# Patient Record
Sex: Female | Born: 1950
Health system: Southern US, Community
[De-identification: ages and names within clinical notes are randomized; demographics above are authoritative.]

## PROBLEM LIST (undated history)

## (undated) DIAGNOSIS — Z9889 Other specified postprocedural states: Secondary | ICD-10-CM

## (undated) DIAGNOSIS — G629 Polyneuropathy, unspecified: Secondary | ICD-10-CM

## (undated) DIAGNOSIS — M1990 Unspecified osteoarthritis, unspecified site: Secondary | ICD-10-CM

## (undated) DIAGNOSIS — D649 Anemia, unspecified: Secondary | ICD-10-CM

## (undated) DIAGNOSIS — E785 Hyperlipidemia, unspecified: Secondary | ICD-10-CM

## (undated) DIAGNOSIS — I10 Essential (primary) hypertension: Secondary | ICD-10-CM

## (undated) DIAGNOSIS — R011 Cardiac murmur, unspecified: Secondary | ICD-10-CM

## (undated) DIAGNOSIS — D869 Sarcoidosis, unspecified: Secondary | ICD-10-CM

## (undated) DIAGNOSIS — R112 Nausea with vomiting, unspecified: Secondary | ICD-10-CM

## (undated) DIAGNOSIS — K219 Gastro-esophageal reflux disease without esophagitis: Secondary | ICD-10-CM

## (undated) HISTORY — PX: TUBAL LIGATION: SHX77

## (undated) HISTORY — PX: MASTECTOMY, PARTIAL: SHX709

## (undated) HISTORY — DX: Hyperlipidemia, unspecified: E78.5

## (undated) HISTORY — PX: CHOLECYSTECTOMY: SHX55

---

## 2000-07-07 ENCOUNTER — Ambulatory Visit (HOSPITAL_COMMUNITY): Admission: RE | Admit: 2000-07-07 | Discharge: 2000-07-07 | Payer: Self-pay | Admitting: General Surgery

## 2001-02-14 ENCOUNTER — Emergency Department (HOSPITAL_COMMUNITY): Admission: EM | Admit: 2001-02-14 | Discharge: 2001-02-14 | Payer: Self-pay | Admitting: Emergency Medicine

## 2001-06-22 ENCOUNTER — Encounter: Payer: Self-pay | Admitting: General Surgery

## 2001-06-22 ENCOUNTER — Ambulatory Visit (HOSPITAL_COMMUNITY): Admission: RE | Admit: 2001-06-22 | Discharge: 2001-06-22 | Payer: Self-pay | Admitting: General Surgery

## 2001-06-28 ENCOUNTER — Emergency Department (HOSPITAL_COMMUNITY): Admission: EM | Admit: 2001-06-28 | Discharge: 2001-06-28 | Payer: Self-pay | Admitting: Emergency Medicine

## 2001-08-23 ENCOUNTER — Ambulatory Visit (HOSPITAL_COMMUNITY): Admission: RE | Admit: 2001-08-23 | Discharge: 2001-08-23 | Payer: Self-pay | Admitting: Internal Medicine

## 2001-08-23 ENCOUNTER — Encounter: Payer: Self-pay | Admitting: Internal Medicine

## 2001-11-06 ENCOUNTER — Ambulatory Visit (HOSPITAL_COMMUNITY): Admission: RE | Admit: 2001-11-06 | Discharge: 2001-11-06 | Payer: Self-pay | Admitting: Internal Medicine

## 2001-11-06 ENCOUNTER — Encounter: Payer: Self-pay | Admitting: Internal Medicine

## 2001-11-09 ENCOUNTER — Encounter: Payer: Self-pay | Admitting: Internal Medicine

## 2001-11-09 ENCOUNTER — Inpatient Hospital Stay (HOSPITAL_COMMUNITY): Admission: AD | Admit: 2001-11-09 | Discharge: 2001-11-11 | Payer: Self-pay | Admitting: Internal Medicine

## 2001-11-24 ENCOUNTER — Emergency Department (HOSPITAL_COMMUNITY): Admission: EM | Admit: 2001-11-24 | Discharge: 2001-11-24 | Payer: Self-pay | Admitting: Emergency Medicine

## 2001-12-01 ENCOUNTER — Inpatient Hospital Stay (HOSPITAL_COMMUNITY): Admission: EM | Admit: 2001-12-01 | Discharge: 2001-12-07 | Payer: Self-pay | Admitting: Psychiatry

## 2002-01-29 ENCOUNTER — Ambulatory Visit (HOSPITAL_COMMUNITY): Admission: RE | Admit: 2002-01-29 | Discharge: 2002-01-29 | Payer: Self-pay | Admitting: Internal Medicine

## 2002-01-29 ENCOUNTER — Encounter: Payer: Self-pay | Admitting: Internal Medicine

## 2002-02-20 ENCOUNTER — Emergency Department (HOSPITAL_COMMUNITY): Admission: EM | Admit: 2002-02-20 | Discharge: 2002-02-20 | Payer: Self-pay | Admitting: *Deleted

## 2002-02-20 ENCOUNTER — Encounter: Payer: Self-pay | Admitting: *Deleted

## 2002-03-26 ENCOUNTER — Ambulatory Visit (HOSPITAL_COMMUNITY): Admission: RE | Admit: 2002-03-26 | Discharge: 2002-03-26 | Payer: Self-pay | Admitting: Internal Medicine

## 2002-03-26 ENCOUNTER — Encounter: Payer: Self-pay | Admitting: Internal Medicine

## 2002-07-10 ENCOUNTER — Encounter: Payer: Self-pay | Admitting: General Surgery

## 2002-07-10 ENCOUNTER — Ambulatory Visit (HOSPITAL_COMMUNITY): Admission: RE | Admit: 2002-07-10 | Discharge: 2002-07-10 | Payer: Self-pay | Admitting: General Surgery

## 2003-02-28 ENCOUNTER — Ambulatory Visit (HOSPITAL_COMMUNITY): Admission: RE | Admit: 2003-02-28 | Discharge: 2003-02-28 | Payer: Self-pay | Admitting: Internal Medicine

## 2003-07-14 ENCOUNTER — Ambulatory Visit (HOSPITAL_COMMUNITY): Admission: RE | Admit: 2003-07-14 | Discharge: 2003-07-14 | Payer: Self-pay | Admitting: Internal Medicine

## 2003-08-01 ENCOUNTER — Ambulatory Visit (HOSPITAL_COMMUNITY): Admission: RE | Admit: 2003-08-01 | Discharge: 2003-08-01 | Payer: Self-pay | Admitting: Internal Medicine

## 2003-08-06 ENCOUNTER — Ambulatory Visit (HOSPITAL_COMMUNITY): Admission: RE | Admit: 2003-08-06 | Discharge: 2003-08-06 | Payer: Self-pay | Admitting: Internal Medicine

## 2003-08-18 ENCOUNTER — Ambulatory Visit (HOSPITAL_COMMUNITY): Admission: RE | Admit: 2003-08-18 | Discharge: 2003-08-18 | Payer: Self-pay | Admitting: Orthopedic Surgery

## 2004-01-12 ENCOUNTER — Ambulatory Visit (HOSPITAL_COMMUNITY): Admission: RE | Admit: 2004-01-12 | Discharge: 2004-01-12 | Payer: Self-pay | Admitting: Internal Medicine

## 2004-08-09 ENCOUNTER — Ambulatory Visit (HOSPITAL_COMMUNITY): Admission: RE | Admit: 2004-08-09 | Discharge: 2004-08-09 | Payer: Self-pay | Admitting: Internal Medicine

## 2004-08-20 ENCOUNTER — Encounter: Admission: RE | Admit: 2004-08-20 | Discharge: 2004-08-20 | Payer: Self-pay | Admitting: Internal Medicine

## 2004-08-29 ENCOUNTER — Emergency Department (HOSPITAL_COMMUNITY): Admission: EM | Admit: 2004-08-29 | Discharge: 2004-08-29 | Payer: Self-pay | Admitting: Emergency Medicine

## 2004-09-01 ENCOUNTER — Ambulatory Visit (HOSPITAL_COMMUNITY): Admission: RE | Admit: 2004-09-01 | Discharge: 2004-09-01 | Payer: Self-pay | Admitting: Emergency Medicine

## 2004-09-08 ENCOUNTER — Ambulatory Visit: Payer: Self-pay | Admitting: Orthopedic Surgery

## 2005-07-28 ENCOUNTER — Emergency Department (HOSPITAL_COMMUNITY): Admission: EM | Admit: 2005-07-28 | Discharge: 2005-07-28 | Payer: Self-pay | Admitting: Emergency Medicine

## 2005-12-19 ENCOUNTER — Ambulatory Visit (HOSPITAL_COMMUNITY): Admission: RE | Admit: 2005-12-19 | Discharge: 2005-12-19 | Payer: Self-pay | Admitting: Internal Medicine

## 2007-05-07 ENCOUNTER — Emergency Department (HOSPITAL_COMMUNITY): Admission: EM | Admit: 2007-05-07 | Discharge: 2007-05-07 | Payer: Self-pay | Admitting: Emergency Medicine

## 2007-05-08 ENCOUNTER — Encounter: Admission: RE | Admit: 2007-05-08 | Discharge: 2007-05-08 | Payer: Self-pay | Admitting: Internal Medicine

## 2007-08-24 ENCOUNTER — Ambulatory Visit (HOSPITAL_COMMUNITY): Admission: RE | Admit: 2007-08-24 | Discharge: 2007-08-24 | Payer: Self-pay | Admitting: Cardiology

## 2008-07-04 ENCOUNTER — Encounter: Admission: RE | Admit: 2008-07-04 | Discharge: 2008-07-04 | Payer: Self-pay | Admitting: Internal Medicine

## 2008-12-28 ENCOUNTER — Emergency Department (HOSPITAL_COMMUNITY): Admission: EM | Admit: 2008-12-28 | Discharge: 2008-12-28 | Payer: Self-pay | Admitting: Emergency Medicine

## 2009-04-08 ENCOUNTER — Emergency Department (HOSPITAL_COMMUNITY): Admission: EM | Admit: 2009-04-08 | Discharge: 2009-04-08 | Payer: Self-pay | Admitting: Emergency Medicine

## 2010-04-18 ENCOUNTER — Encounter: Payer: Self-pay | Admitting: Internal Medicine

## 2010-05-21 ENCOUNTER — Encounter: Payer: Self-pay | Admitting: Internal Medicine

## 2010-05-25 NOTE — Letter (Addendum)
Summary: [facility] TRIAGE  [facility] TRIAGE   Imported By: Rexene Alberts 05/21/2010 09:09:42  _____________________________________________________________________  External Attachment:    Type:   Image     Comment:   External Document  Appended Document: [facility] TRIAGE decrease lantus to 30 units, hold glipizide and metformin the night before the procedure; o/w OK  Appended Document: [facility] TRIAGE instructions mailed to patient and explained on the phone

## 2010-06-02 ENCOUNTER — Ambulatory Visit (HOSPITAL_COMMUNITY)
Admission: RE | Admit: 2010-06-02 | Discharge: 2010-06-02 | Disposition: A | Discharge status: Home / Self Care | Payer: No Typology Code available for payment source | Source: Ambulatory Visit | Attending: Internal Medicine | Admitting: Internal Medicine

## 2010-06-02 ENCOUNTER — Encounter: Payer: No Typology Code available for payment source | Admitting: Internal Medicine

## 2010-06-02 ENCOUNTER — Encounter: Payer: Self-pay | Admitting: Internal Medicine

## 2010-06-02 ENCOUNTER — Other Ambulatory Visit: Payer: Self-pay | Admitting: Internal Medicine

## 2010-06-02 DIAGNOSIS — D869 Sarcoidosis, unspecified: Secondary | ICD-10-CM | POA: Insufficient documentation

## 2010-06-02 DIAGNOSIS — Z794 Long term (current) use of insulin: Secondary | ICD-10-CM | POA: Insufficient documentation

## 2010-06-02 DIAGNOSIS — D126 Benign neoplasm of colon, unspecified: Secondary | ICD-10-CM | POA: Insufficient documentation

## 2010-06-02 DIAGNOSIS — K5730 Diverticulosis of large intestine without perforation or abscess without bleeding: Secondary | ICD-10-CM | POA: Insufficient documentation

## 2010-06-02 DIAGNOSIS — K648 Other hemorrhoids: Secondary | ICD-10-CM

## 2010-06-02 DIAGNOSIS — I10 Essential (primary) hypertension: Secondary | ICD-10-CM | POA: Insufficient documentation

## 2010-06-02 DIAGNOSIS — E119 Type 2 diabetes mellitus without complications: Secondary | ICD-10-CM | POA: Insufficient documentation

## 2010-06-02 DIAGNOSIS — Z1211 Encounter for screening for malignant neoplasm of colon: Secondary | ICD-10-CM

## 2010-06-02 DIAGNOSIS — Z79899 Other long term (current) drug therapy: Secondary | ICD-10-CM | POA: Insufficient documentation

## 2010-06-02 LAB — GLUCOSE, CAPILLARY: Glucose-Capillary: 148 mg/dL — ABNORMAL HIGH (ref 70–99)

## 2010-06-13 ENCOUNTER — Encounter: Payer: Self-pay | Admitting: Internal Medicine

## 2010-06-17 ENCOUNTER — Other Ambulatory Visit: Payer: Self-pay | Admitting: Internal Medicine

## 2010-06-17 DIAGNOSIS — Z1231 Encounter for screening mammogram for malignant neoplasm of breast: Secondary | ICD-10-CM

## 2010-06-22 NOTE — Op Note (Signed)
  NAME:  Ann Roberson, Ann Roberson            ACCOUNT NO.:  [id number]  MEDICAL RECORD NO.:  [id number]           PATIENT TYPE:  O  LOCATION:  DAYP                          FACILITY:  [facility]  PHYSICIAN:  R. Roetta Sessions, M.D. DATE OF BIRTH:  1950/04/23  DATE OF PROCEDURE:  06/02/2010 DATE OF DISCHARGE:  06/02/2010                              OPERATIVE REPORT   PROCEDURE:  Colonoscopy with snare polypectomy and polyp ablation.  INDICATIONS FOR PROCEDURE:  A 60 year old lady here for first ever screening colonoscopy.  She has no GI symptoms.  No family history of polyps or colon cancer in any first-degree relatives.  Colonoscopy is now being done as screening maneuver.  Risks, benefits, limitations, alternatives and imponderables have been discussed, questions answered. Please see the documentation medical record.  PROCEDURE NOTE:  O2 saturation, blood pressure, pulse, and respirations were monitored throughout the entire procedure.  Conscious sedation Versed 5 mg IV, Demerol 75 mg IV in divided doses.  INSTRUMENT:  Pentax video chip system.  FINDINGS:  Digital rectal exam revealed no abnormalities.  Endoscopic findings:  The prep was adequate.  Colon:  Colonic mucosa was surveyed from the rectosigmoid junction to the left transverse, right colon to the appendiceal orifice, ileocecal valve/cecum.  These structures well seen photographed for the record.  From this level, the scope slowly and cautiously withdrawn. All previous mucosal surfaces were again seen. The patient with multiple colonic polyps.  Multiple snare polypectomies were performed in the cecal and the ascending segments diminutive polyps were ablated in the transverse and descending segments as well.  The patient was also noted have pan colonic diverticulosis.  No other abnormalities were observed.  The scope was pulled down into the rectum where thorough examination of the rectal mucosa including retroflex view of anal verge  and en face view of the anal canal demonstrated only internal hemorrhoids.  The patient tolerated the procedure well.  Cecal withdrawal time 22 minutes.  IMPRESSION: 1. Internal hemorrhoids, otherwise normal rectum. 2. Pancolonic diverticula. 3. Multiple colonic polyps either snared or ablated as described     above.  RECOMMENDATIONS: 1. Literature on diverticulosis and polyps provided to Ms. Swander. 2. Follow up on path. 3. Further recommendations to follow.     Jonathon Bellows, M.D.     RMR/MEDQ  D:  06/13/2010  T:  06/14/2010  Job:  [id number]  Electronically Signed by Lorrin Goodell M.D. on 06/22/2010 11:22:48 AM

## 2010-06-24 NOTE — Letter (Signed)
Summary: Patient Notice, Colon Biopsy Results  [facility]  [address]   [place], [place] [address]   Phone: [phone]  Fax: [phone]       June 13, 2010   Ann Roberson [address] [place], [place]  [address] [birth year]    Dear [name],  I am pleased to inform you that the biopsies taken during your recent colonoscopy did not show any evidence of cancer upon pathologic examination.  Additional information/recommendations:  No further action is needed at this time.  Please follow-up with your primary care physician for your other healthcare needs.  You should have a repeat colonoscopy examination  in 5 years.  Please call us if you are having persistent problems or have questions about your condition that have not been fully answered at this time.  Sincerely,    R. Roetta Sessions MD, FACP [facility] Ph: [phone]    Fax: [phone]   Appended Document: Patient Notice, Colon Biopsy Results letter mailed to pt  Appended Document: Patient Notice, Colon Biopsy Results reminder in epic

## 2010-07-01 LAB — URIC ACID: Uric Acid, Serum: 5.5 mg/dL (ref 2.4–7.0)

## 2010-07-16 ENCOUNTER — Ambulatory Visit
Admission: RE | Admit: 2010-07-16 | Discharge: 2010-07-16 | Disposition: A | Discharge status: Home / Self Care | Payer: No Typology Code available for payment source | Source: Ambulatory Visit | Attending: Internal Medicine | Admitting: Internal Medicine

## 2010-07-16 DIAGNOSIS — Z1231 Encounter for screening mammogram for malignant neoplasm of breast: Secondary | ICD-10-CM

## 2010-08-13 NOTE — H&P (Signed)
NAME:  Ann Roberson, Ann Roberson                      ACCOUNT NO.:  [id number]   MEDICAL RECORD NO.:  [id number]                   PATIENT TYPE:  INP   LOCATION:  A325                                 FACILITY:  [facility]   PHYSICIAN:  Tesfaye D. Felecia Shelling, M.D.              DATE OF BIRTH:  1950/12/17   DATE OF ADMISSION:  11/09/2001  DATE OF DISCHARGE:                                HISTORY & PHYSICAL   CHIEF COMPLAINT:  Shortness of breath and generalized weakness.   HISTORY OF PRESENT ILLNESS:  This is a 60 year old black female with a  history of recent diagnosis of sarcoidosis, who came to the office with the  above complaints.  The patient had a skin lesion on her scalp which was  biopsied and showed granulomatous lesion consistent with sarcoidosis.  She  had also complained of cough and progressive shortness of breath.  Her chest  x-ray was done which was showing hilar lymphadenopathy with some scarring  which was compatible with sarcoidosis.  She was started on oral steroid.  Initially, the patient started feeling better.  When the steroid started  being tapered, the patient developed symptoms of shortness of breath and  cough.  Her steroid was again increased to 60 mg p.o. daily, however, the  patient came back with the complaint of generalized weakness, polyuria and  nocturia.  Blood test was done and her blood sugar was in the range of 500  mg/dl.  The patient was given a dose of Regular insulin and she was given a  prescription for long-acting insulin, however, the patient's blood sugar  remained around 500 mg/dl and the patient continued to complain of shortness  of breath and generalized weakness.  She came to the point where she was not  able to give insulin for herself and her appetite was very poor.  The  patient was unable to manage herself at home.  She was then reevaluated and  was admitted for further evaluation of her shortness of breath and  management of her  hyperglycemia.   PAST MEDICAL HISTORY:  1. Sarcoidosis.  2. Obesity.   CURRENT MEDICATIONS:  1. Prednisone 60 mg p.o. q.d.  2. Insulin 70/30 -- 30 units subcut. in a.m.   PERSONAL AND SOCIAL HISTORY:  The patient is divorced.  She has one child.  She denies history of alcohol, tobacco or substance abuse.   PHYSICAL EXAMINATION:  GENERAL:  The patient is alert, awake and is acutely  sick-looking.  VITALS:  Blood pressure 130/80, pulse 88, respiratory rate 16, temperature  98 degrees Fahrenheit.  HEENT:  Pupils are equal and reactive.  NECK:  Supple.  CHEST:  There is decreased air entry and bilateral rhonchi.  CARDIOVASCULAR:  First and second heart sounds heard.  No murmur.  No  gallop.  ABDOMEN:  Abdomen is soft and relaxed.  Bowel sounds are positive.  No mass.  No organomegaly.  Obese.  EXTREMITIES:  No leg edema.    ASSESSMENT:  1. This is a 60 year old black female who was recently diagnosed with     cutaneous and pulmonary sarcoidosis.  She was started on steroid,     however, the patient has progressive shortness of breath and generalized     weakness.  Etiology of her symptoms is not fully clear but could be     secondary to the sarcoidosis.  2. Steroid-induced diabetes mellitus.  3. Morbid obesity.   PLAN:  Will start the patient on IV fluids and rehydrate gradually.  Will  start her on Accu-Chek a.c. and q.h.s. with coverage.  Will do diabetic  teaching and instruct the patient how to give insulin.  Will continue the  patient on steroid and will start her on oxygen.                                               Tesfaye D. Felecia Shelling, M.D.    TDF/MEDQ  D:  11/09/2001  T:  11/09/2001  Job:  [id number]

## 2010-08-13 NOTE — H&P (Signed)
NAME:  Ann Roberson, Ann Roberson NO.:  [id number]   MEDICAL RECORD NO.:  [id number]                   PATIENT TYPE:  IPS   LOCATION:  0406                                 FACILITY:  [facility]   PHYSICIAN:  Jeanice Lim, MD                DATE OF BIRTH:  [birth year]   DATE OF ADMISSION:  12/01/2001  DATE OF DISCHARGE:                         PSYCHIATRIC ADMISSION ASSESSMENT   IDENTIFYING INFORMATION:  A 60 year old single African-American female,  voluntarily admitted for psychosis on December 01, 2001.   HISTORY OF PRESENT ILLNESS:  The patient presents with a history of  confusion, decompensating for at least a week.  The patient feels very  anxious and afraid, does not know why.  Has been on prednisone since May, on  tapering doses for history of sarcoidosis.  Has an appointment this upcoming  Thursday with her lung specialist.  Feels like she is in slow motion, has  a film over her eyes.  She denies any psychotic symptoms, hallucinations,  but admits to some depression, with reports of decreased sleep and decreased  appetite.  The patient feels afraid of things, is not sure what.  Was  recently diagnosed as a diabetic, states she is afraid of sticking herself  and afraid of sleeping at present.   PAST PSYCHIATRIC HISTORY:  Last hospitalization was 15 years ago for a  nervous breakdown.  No history of a suicide attempt.   SOCIAL HISTORY:  The patient is a 60 year old single African-American  female.  She has a 43 year old child.  She lives with her child.  She is a  Lawyer in [employer], broadcasting/film/video.   FAMILY HISTORY:  Unknown.   ALCOHOL DRUG HISTORY:  She denies any alcohol or substance abuse.   PAST MEDICAL HISTORY:  Primary care provider is Dr. Felecia Shelling, her general  practitioner, [phone] is phone number.  Medical problems are insulin-  dependent diabetes, lung infection.   MEDICATIONS:  Prednisone 10 mg q.d., NPH insulin 30 mg in the morning, 20 mg  q.p.m.   DRUG ALLERGIES:  No known allergies.   PHYSICAL EXAMINATION:  Performed at [facility].  CBC was within normal  limits.  Glucose was 133, total bilirubin was 1.7, albumin was 3.2.  Urine  drug screen was positive for benzos.  Urine pregnancy test was negative.  Alcohol level less than 5.  Urinalysis showed small bili, some protein, few  bacteria, amber and hazy appearing.   MENTAL STATUS EXAM:  She is an alert, obese African-American female.  She is  cooperative.  She is dressed in a gown.  Speech is clear, mood is depressed  and anxious, affect is depressed and anxious, keeps squinting trying to see  me.  Thought processes are positive for thought blocking, positive paranoia,  no auditory or visual hallucinations, no suicidal or homicidal ideation.  Cognitive function intact.  Judgment and insight are fair.   ADMISSION DIAGNOSES:   AXIS  I:  1. Psychosis not otherwise specified.  2. Rule out steroid-induced psychosis.   AXIS II:  Deferred.   AXIS III:  Insulin-dependent diabetes, sarcoidosis.   AXIS IV:  Medical problems.   AXIS V:  Current is 25, this past year 23.   PLAN:  Voluntary admission to [facility] for psychosis.  Contract for safety, check every 15 minutes.  The patient will be placed on  the 400 hall.  Will obtain labs, will continue her medications, will check  her blood sugars.  The patient to be put on a diabetic diet.  Will contact  her primary care provider in regards to her prednisone taper.  Our goal is  to stabilize mood and thinking so the patient can be safe.  Consider an  internal medicine consult if the patient does not clear.       Landry Corporal, N.P.                       Jeanice Lim, MD    JO/MEDQ  D:  12/04/2001  T:  12/04/2001  Job:  [id number]

## 2010-08-13 NOTE — Discharge Summary (Signed)
   NAME:  Ann Roberson, Ann Roberson                      ACCOUNT NO.:  [id number]   MEDICAL RECORD NO.:  [id number]                   PATIENT TYPE:  INP   LOCATION:  A325                                 FACILITY:  [facility]   PHYSICIAN:  Tesfaye D. Felecia Shelling, M.D.              DATE OF BIRTH:  21-Nov-1950   DATE OF ADMISSION:  11/09/2001  DATE OF DISCHARGE:  11/11/2001                                 DISCHARGE SUMMARY   DISCHARGE DIAGNOSES:  1. Steroid-induced diabetes mellitus.  2. Obesity.  3. Sarcoidosis.   DISCHARGE MEDICATIONS:  1. Humulin insulin 70/30 30 units subcu in a.m. and 20 units subcu in p.m.  2. Prednisone 60 mg p.o. q.d.   DISPOSITION:  The patient was discharged home in stable condition.   HOSPITAL COURSE:  This is a 60 year old female patient who was recently  diagnosed with sarcoidosis.  The patient was started on prednisone 60 mg  p.o. q.d. for treatment of her sarcoidosis.  However, the patient developed  hyperglycemia.  She was tried to be treated with insulin as an outpatient.  However, her blood sugar continued to be above 500.  She was admitted and  was treated with sliding scale coverage.  Her insulin requirement was  adjusted.  The patient was educated about diabetes and how to self-inject  insulin.  She was discharged home in stable condition.                                               Tesfaye D. Felecia Shelling, M.D.    TDF/MEDQ  D:  12/27/2001  T:  12/31/2001  Job:  [id number]

## 2010-08-13 NOTE — Discharge Summary (Signed)
NAME:  Ann Roberson, Ann Roberson                      ACCOUNT NO.:  [id number]   MEDICAL RECORD NO.:  [id number]                   PATIENT TYPE:  IPS   LOCATION:  0406                                 FACILITY:  [facility]   PHYSICIAN:  Geoffery Lyons, M.D.                   DATE OF BIRTH:  01-01-1951   DATE OF ADMISSION:  12/01/2001  DATE OF DISCHARGE:  12/07/2001                                 DISCHARGE SUMMARY   CHIEF COMPLAINT AND PRESENT ILLNESS:  This was one of several admissions to  [facility] for this 60 year old single African-American  female voluntarily admitted on December 01, 2001.  History of confusion,  decompensating for at least a week, very anxious and afraid, does not know  why.  Has been on prednisone since May.  Endorses for a history of ________.  Appointment upcoming Thursday with her lung specialist.  Like she is in slow  motion, film over her eyes.  No psychotic symptoms, hallucinations.  Admits  to some depression, decreased sleep, decreased appetite, afraid of things.  Initially diagnosed as diabetic.  Afraid of sticking herself and a fear of  sleeping.   PAST PSYCHIATRIC HISTORY:  Last time hospitalized 15 years ago for nervous  breakdown.  No history of suicide attempts.   ALCOHOL/DRUG HISTORY:  Denies the use or abuse of any substances.   PAST MEDICAL HISTORY:  Insulin-dependent diabetes mellitus.   MEDICATIONS:  Prednisone 10 mg daily, NPH insulin 30 in the morning and 20  in the afternoon.   PHYSICAL EXAMINATION:  Performed at [facility] and failed to show any acute  findings.   MENTAL STATUS EXAM:  Alert, obese, African-American female.  Cooperative.  Dressed in a gown.  Speech is clear.  Mood is depressed and anxious.  Affect  is depressed and anxious.  Keeps squinting, trying to see the person doing  the evaluation.  Thought processes are positive for thought-blocking,  positive paranoia and auditory or visual hallucinations.  No suicidal  or  homicidal ideation.  Cognition well-preserved.   ADMISSION DIAGNOSES:   AXIS I:  1. Rule out psychotic disorder not otherwise specified.  2. Rule out steroid-induced psychosis.   AXIS II:  Deferred.   AXIS III:  1. Insulin-dependent diabetes mellitus.  2. ______.   AXIS IV:  Moderate.   AXIS V:  Global Assessment of Functioning upon admission 25; highest Global  Assessment of Functioning in the last year 70.   LABORATORY DATA:  CBC was within normal limits.  Blood chemistries were  within normal limits.  Glucose was 133 upon admission, 85 on the 9th.  Total  bilirubin was 1.7.  Other liver tests were within normal limits.  RPR was  nonreactive.   HOSPITAL COURSE:  She was admitted and started intensive individual and  group psychotherapy.  She was kept on her prednisone and her Xanax and her  insulin.  As  we were trying to establish what was going on, medical services  were consulted.  Meanwhile, she was placed on Risperdal 1.25 mg three times  a day and 0.5 mg at night and she was given the Xanax 0.5 mg as needed every  eight hours as needed for anxiety.  Prednisone was decreased to 5 mg per  day.  Insulin was adjusted.  Risperdal was decreased to 0.25 mg twice a day  and 0.5 mg at night and the insulin was adjusted one more time to 35 units  in the morning and 20 units in the afternoon.  As the hospitalization  progressed, she started improving.  Initially, issues of being confused, not  being able to put into words what was going on, her frustration.  Because of  this, she started sleeping better.  Became less anxious, less agitated and  objectively seemed to be settling down.  No further complaints about her  eyesight.  On December 07, 2001, she was much improved.  No agitation.  No  irritability.  Was sleeping better.  Her eyesight was better.  She endorsed  no suicidal ideation.  No homicidal ideation.  It was felt that she was back  to her baseline so discharge  was considered and granted.   DISCHARGE DIAGNOSES:   AXIS I:  1. Psychotic disorder not otherwise specified.  2. Rule out steroid-induced psychosis.   AXIS II:  No diagnosis.   AXIS III:  1. __________.  2. Insulin-dependent diabetes mellitus.   AXIS IV:  Moderate.   AXIS V:  Global Assessment of Functioning upon discharge 55-60.   DISCHARGE MEDICATIONS:  1. Xanax 0.5 mg three times a day.  2. Novolin 70/30 insulin 30 units in the morning and 20 units in the     afternoon.  3. Risperdal 0.25 mg twice a day and 0.5 mg at night.  4. Prednisone 1/2 a tab daily.   FOLLOW UP:  [facility].                                               Geoffery Lyons, M.D.    IL/MEDQ  D:  01/09/2002  T:  01/09/2002  Job:  [id number]

## 2010-12-17 LAB — POCT CARDIAC MARKERS
CKMB, poc: 2.3
Myoglobin, poc: 155
Operator id: 221061
Troponin i, poc: <0.05

## 2010-12-17 LAB — BASIC METABOLIC PANEL WITH GFR
BUN: 17
CO2: 27
Calcium: 9.1
Chloride: 103
Creatinine, Ser: 0.97
GFR calc non Af Amer: 60 — ABNORMAL LOW
Glucose, Bld: 140 — ABNORMAL HIGH
Potassium: 3.6
Sodium: 135

## 2010-12-17 LAB — DIFFERENTIAL
Basophils Absolute: 0
Basophils Relative: 1
Eosinophils Absolute: 0.2
Eosinophils Relative: 4
Lymphocytes Relative: 26
Lymphs Abs: 1
Monocytes Absolute: 0.6
Monocytes Relative: 15 — ABNORMAL HIGH
Neutro Abs: 2.2
Neutrophils Relative %: 55

## 2010-12-17 LAB — CBC
HCT: 39.7
Hemoglobin: 13.6
MCHC: 34.1
MCV: 83.5
Platelets: 295
RBC: 4.75
RDW: 13.4
WBC: 4.1

## 2010-12-22 LAB — BASIC METABOLIC PANEL
BUN: 19
CO2: 27
Calcium: 9
Chloride: 105
Creatinine, Ser: 1.06
GFR calc Af Amer: >60
GFR calc non Af Amer: 54 — ABNORMAL LOW
Glucose, Bld: 156 — ABNORMAL HIGH
Potassium: 3.8
Sodium: 138

## 2010-12-22 LAB — CBC
HCT: 35.6 — ABNORMAL LOW
Hemoglobin: 11.6 — ABNORMAL LOW
MCHC: 32.6
MCV: 86.2
Platelets: 300
RBC: 4.13
RDW: 12.9
WBC: 6.8

## 2010-12-22 LAB — PROTIME-INR
INR: 0.9
Prothrombin Time: 12.8

## 2011-01-29 ENCOUNTER — Emergency Department (HOSPITAL_COMMUNITY): Payer: No Typology Code available for payment source

## 2011-01-29 ENCOUNTER — Emergency Department (HOSPITAL_COMMUNITY)
Admission: EM | Admit: 2011-01-29 | Discharge: 2011-01-29 | Disposition: A | Discharge status: Home / Self Care | Payer: No Typology Code available for payment source | Attending: Emergency Medicine | Admitting: Emergency Medicine

## 2011-01-29 DIAGNOSIS — Z9889 Other specified postprocedural states: Secondary | ICD-10-CM | POA: Insufficient documentation

## 2011-01-29 DIAGNOSIS — R109 Unspecified abdominal pain: Secondary | ICD-10-CM | POA: Insufficient documentation

## 2011-01-29 DIAGNOSIS — D869 Sarcoidosis, unspecified: Secondary | ICD-10-CM | POA: Insufficient documentation

## 2011-01-29 DIAGNOSIS — N390 Urinary tract infection, site not specified: Secondary | ICD-10-CM | POA: Insufficient documentation

## 2011-01-29 DIAGNOSIS — E119 Type 2 diabetes mellitus without complications: Secondary | ICD-10-CM | POA: Insufficient documentation

## 2011-01-29 HISTORY — DX: Sarcoidosis, unspecified: D86.9

## 2011-01-29 LAB — DIFFERENTIAL
Basophils Absolute: 0 10*3/uL (ref 0.0–0.1)
Basophils Relative: 1 % (ref 0–1)
Eosinophils Absolute: 0.2 10*3/uL (ref 0.0–0.7)
Eosinophils Relative: 3 % (ref 0–5)
Lymphocytes Relative: 21 % (ref 12–46)
Lymphs Abs: 1.6 10*3/uL (ref 0.7–4.0)
Monocytes Absolute: 0.6 10*3/uL (ref 0.1–1.0)
Monocytes Relative: 8 % (ref 3–12)
Neutro Abs: 5.1 10*3/uL (ref 1.7–7.7)
Neutrophils Relative %: 68 % (ref 43–77)

## 2011-01-29 LAB — URINALYSIS, ROUTINE W REFLEX MICROSCOPIC
Bilirubin Urine: NEGATIVE
Glucose, UA: NEGATIVE mg/dL
Hgb urine dipstick: NEGATIVE
Nitrite: NEGATIVE
Protein, ur: NEGATIVE mg/dL
Specific Gravity, Urine: >1.03 — ABNORMAL HIGH (ref 1.005–1.030)
Urobilinogen, UA: 0.2 mg/dL (ref 0.0–1.0)
pH: 6 (ref 5.0–8.0)

## 2011-01-29 LAB — COMPREHENSIVE METABOLIC PANEL
ALT: 16 U/L (ref 0–35)
AST: 13 U/L (ref 0–37)
Albumin: 3.4 g/dL — ABNORMAL LOW (ref 3.5–5.2)
Alkaline Phosphatase: 79 U/L (ref 39–117)
BUN: 13 mg/dL (ref 6–23)
CO2: 29 mEq/L (ref 19–32)
Calcium: 10 mg/dL (ref 8.4–10.5)
Chloride: 101 mEq/L (ref 96–112)
Creatinine, Ser: 0.86 mg/dL (ref 0.50–1.10)
GFR calc Af Amer: 85 mL/min — ABNORMAL LOW (ref >90)
GFR calc non Af Amer: 73 mL/min — ABNORMAL LOW (ref >90)
Glucose, Bld: 88 mg/dL (ref 70–99)
Potassium: 3.7 mEq/L (ref 3.5–5.1)
Sodium: 139 mEq/L (ref 135–145)
Total Bilirubin: 0.3 mg/dL (ref 0.3–1.2)
Total Protein: 7.7 g/dL (ref 6.0–8.3)

## 2011-01-29 LAB — CBC
HCT: 37.5 % (ref 36.0–46.0)
Hemoglobin: 11.7 g/dL — ABNORMAL LOW (ref 12.0–15.0)
MCH: 27.1 pg (ref 26.0–34.0)
MCHC: 31.2 g/dL (ref 30.0–36.0)
MCV: 86.8 fL (ref 78.0–100.0)
Platelets: 291 10*3/uL (ref 150–400)
RBC: 4.32 MIL/uL (ref 3.87–5.11)
RDW: 12.8 % (ref 11.5–15.5)
WBC: 7.6 10*3/uL (ref 4.0–10.5)

## 2011-01-29 LAB — GLUCOSE, CAPILLARY: Glucose-Capillary: 97 mg/dL (ref 70–99)

## 2011-01-29 LAB — URINE MICROSCOPIC-ADD ON

## 2011-01-29 LAB — LIPASE, BLOOD: Lipase: 22 U/L (ref 11–59)

## 2011-01-29 MED ORDER — CEPHALEXIN 500 MG PO CAPS
500.0000 mg | ORAL_CAPSULE | Freq: Once | ORAL | Status: AC
  Administered 2011-01-29: 500 mg via ORAL
  Filled 2011-01-29: qty 1

## 2011-01-29 MED ORDER — CEPHALEXIN 500 MG PO CAPS: 500.0000 mg | ORAL_CAPSULE | Freq: Four times a day (QID) | ORAL | Status: AC

## 2011-01-29 MED ORDER — OXYCODONE-ACETAMINOPHEN 5-325 MG PO TABS
1.0000 | ORAL_TABLET | Freq: Once | ORAL | Status: AC
  Administered 2011-01-29: 1 via ORAL
  Filled 2011-01-29: qty 1

## 2011-01-29 NOTE — ED Notes (Signed)
Pt given discharge instructions, paperwork & prescription(s), pt verbalized understanding.   

## 2011-01-29 NOTE — ED Notes (Signed)
Pt given peanut butter crackers and diet ginger ale 

## 2011-01-29 NOTE — ED Notes (Signed)
Pt presents with LLQ pain that radiates around to left side and hip. Pt denies n/v/d and all urinary symptoms. Pt states symptoms started approx 4 days ago. NAD at this time.

## 2011-01-29 NOTE — ED Provider Notes (Signed)
History     CSN: [id number] Arrival date & time: 01/29/2011  3:43 PM     Chief Complaint  Patient presents with  . Flank Pain  . Abdominal Pain    HPI Pt was seen at 1550.  Per pt, c/o gradual onset and persistence of constant left sided low back "pain" that began 4 days ago.  Describes the pain as radiating into the left side of her torso and hip.  Denies vaginal bleeding/discharge, no dysuria/hematuria, no N/V/D, no fevers, no CP/SOB, no tingling/numbness in extremities, no focal motor weakness, no saddle anesthesia, no incont/retention of bowel/bladder.   Past Medical History  Diagnosis Date  . Diabetes mellitus   . Sarcoidosis     Past Surgical History  Procedure Date  . Cesarean section   . Cholecystectomy     Social History  . Marital Status: Divorced   Social History Main Topics  . Smoking status: Never Smoker   . Smokeless tobacco: None  . Alcohol Use: No  . Drug Use: No    Review of Systems ROS: Statement: All systems negative except as marked or noted in the HPI; Constitutional: Negative for fever and chills. ; ; Eyes: Negative for eye pain, redness and discharge. ; ; ENMT: Negative for ear pain, hoarseness, nasal congestion, sinus pressure and sore throat. ; ; Cardiovascular: Negative for chest pain, palpitations, diaphoresis, dyspnea and peripheral edema. ; ; Respiratory: Negative for cough, wheezing and stridor. ; ; Gastrointestinal: Negative for nausea, vomiting, diarrhea and abdominal pain, blood in stool, hematemesis, jaundice and rectal bleeding. . ; ; Genitourinary: Negative for dysuria, flank pain and hematuria. ; ; Musculoskeletal: +left sided LBP.  Negative for neck pain. Negative for swelling and trauma.; ; Skin: Negative for pruritus, rash, abrasions, blisters, bruising and skin lesion.; ; Neuro: Negative for headache, lightheadedness and neck stiffness. Negative for weakness, altered level of consciousness , altered mental status, extremity weakness,  paresthesias, involuntary movement, seizure and syncope.     Allergies  Erythromycin  Home Medications  No current outpatient prescriptions on file.  BP 140/63  Pulse 78  Temp(Src) 97.7 F (36.5 C) (Oral)  Resp 18  SpO2 99%  Physical Exam 1555: Physical examination:  Nursing notes reviewed; Vital signs and O2 SAT reviewed;  Constitutional: Well developed, Well nourished, Well hydrated, In no acute distress; Head:  Normocephalic, atraumatic; Eyes: EOMI, PERRL, No scleral icterus; ENMT: Mouth and pharynx normal, Mucous membranes moist; Neck: Supple, Full range of motion, No lymphadenopathy; Cardiovascular: Regular rate and rhythm, No murmur, rub, or gallop; Respiratory: Breath sounds clear & equal bilaterally, No rales, rhonchi, wheezes, or rub, Normal respiratory effort/excursion; Chest: Nontender, Movement normal; Abdomen: Soft, Nontender, Nondistended, Normal bowel sounds; Genitourinary: No CVA tenderness; Spine:  No midline CS, TS, LS tenderness.  Extremities: Pulses normal, No tenderness, No edema, No calf edema or asymmetry.; Neuro: AA&Ox3, Major CN grossly intact.  No gross focal motor or sensory deficits in extremities.; Skin: Color normal, Warm, Dry, no rash.    ED Course  Procedures  MDM  MDM Reviewed: nursing note and vitals Interpretation: labs and CT scan   Results for orders placed during the hospital encounter of 01/29/11  URINALYSIS, ROUTINE W REFLEX MICROSCOPIC      Component Value Range   Color, Urine YELLOW  YELLOW    Appearance HAZY (*) CLEAR    Specific Gravity, Urine >1.030 (*) 1.005 - 1.030    pH 6.0  5.0 - 8.0    Glucose, UA NEGATIVE  NEGATIVE (  mg/dL)   Hgb urine dipstick NEGATIVE  NEGATIVE    Bilirubin Urine NEGATIVE  NEGATIVE    Ketones, ur TRACE (*) NEGATIVE (mg/dL)   Protein, ur NEGATIVE  NEGATIVE (mg/dL)   Urobilinogen, UA 0.2  0.0 - 1.0 (mg/dL)   Nitrite NEGATIVE  NEGATIVE    Leukocytes, UA SMALL (*) NEGATIVE   CBC      Component Value Range     WBC 7.6  4.0 - 10.5 (K/uL)   RBC 4.32  3.87 - 5.11 (MIL/uL)   Hemoglobin 11.7 (*) 12.0 - 15.0 (g/dL)   HCT 96.2  95.2 - 84.1 (%)   MCV 86.8  78.0 - 100.0 (fL)   MCH 27.1  26.0 - 34.0 (pg)   MCHC 31.2  30.0 - 36.0 (g/dL)   RDW 32.4  40.1 - 02.7 (%)   Platelets 291  150 - 400 (K/uL)  DIFFERENTIAL      Component Value Range   Neutrophils Relative 68  43 - 77 (%)   Neutro Abs 5.1  1.7 - 7.7 (K/uL)   Lymphocytes Relative 21  12 - 46 (%)   Lymphs Abs 1.6  0.7 - 4.0 (K/uL)   Monocytes Relative 8  3 - 12 (%)   Monocytes Absolute 0.6  0.1 - 1.0 (K/uL)   Eosinophils Relative 3  0 - 5 (%)   Eosinophils Absolute 0.2  0.0 - 0.7 (K/uL)   Basophils Relative 1  0 - 1 (%)   Basophils Absolute 0.0  0.0 - 0.1 (K/uL)  COMPREHENSIVE METABOLIC PANEL      Component Value Range   Sodium 139  135 - 145 (mEq/L)   Potassium 3.7  3.5 - 5.1 (mEq/L)   Chloride 101  96 - 112 (mEq/L)   CO2 29  19 - 32 (mEq/L)   Glucose, Bld 88  70 - 99 (mg/dL)   BUN 13  6 - 23 (mg/dL)   Creatinine, Ser 2.53  0.50 - 1.10 (mg/dL)   Calcium 66.4  8.4 - 10.5 (mg/dL)   Total Protein 7.7  6.0 - 8.3 (g/dL)   Albumin 3.4 (*) 3.5 - 5.2 (g/dL)   AST 13  0 - 37 (U/L)   ALT 16  0 - 35 (U/L)   Alkaline Phosphatase 79  39 - 117 (U/L)   Total Bilirubin 0.3  0.3 - 1.2 (mg/dL)   GFR calc non Af Amer 73 (*) >90 (mL/min)   GFR calc Af Amer 85 (*) >90 (mL/min)  URINE MICROSCOPIC-ADD ON      Component Value Range   Squamous Epithelial / LPF FEW (*) RARE    WBC, UA 21-50  <3 (WBC/hpf)   Bacteria, UA MANY (*) RARE   LIPASE, BLOOD      Component Value Range   Lipase 22  11 - 59 (U/L)  GLUCOSE, CAPILLARY      Component Value Range   Glucose-Capillary 97  70 - 99 (mg/dL)   Comment 1 Documented in Chart     Comment 2 Notify RN     Ct Abdomen Pelvis Wo Contrast  01/29/2011  *RADIOLOGY REPORT*  Clinical Data: Left flank pain.  History of cholecystectomy.  CT ABDOMEN AND PELVIS WITHOUT CONTRAST  Technique:  Multidetector CT imaging of  the abdomen and pelvis was performed following the standard protocol without intravenous contrast.  Comparison: None.  Findings: No focal abnormalities seen in the liver or spleen on this study performed without intravenous contrast material.  The stomach, duodenum, pancreas, adrenal glands  are normal.  The gallbladder is surgically absent.  No stones are seen in either kidney.  There is no hydronephrosis or secondary change in either kidney.  No evidence for ureteral or bladder stones.  No abdominal aortic aneurysm.  Free fluid or lymphadenopathy in the abdomen.  Umbilical hernia contains only fat.  Imaging through the pelvis shows no intraperitoneal free fluid.  No pelvic sidewall lymphadenopathy.  Bladder is unremarkable.  Uterus has normal imaging features.  There is no adnexal mass.  No colonic diverticulitis.  The terminal ileum is normal.  The appendix is normal.  Bone windows reveal no worrisome lytic or sclerotic osseous lesions.  IMPRESSION: No acute findings in the abdomen or pelvis.  Specifically, no evidence to explain the patient's history of left flank pain.  Original Report Authenticated By: ERIC A. MANSELL, M.D.   7:36 PM:  Pt states she wants to go home now.  Will give 1st dose abx in ED for UTI.  Dx testing d/w pt.  Questions answered.  Verb understanding, agreeable to d/c home with outpt f/u.   MCMANUS,KATHLEEN Allison Quarry, DO 01/31/11 1229

## 2011-02-01 LAB — URINE CULTURE
Colony Count: 10000
Culture  Setup Time: 201211032231

## 2011-02-02 NOTE — ED Notes (Signed)
+   Urine Patient treated with Keflex-sensitive to same-chart appended per protocol MD. 

## 2011-06-21 ENCOUNTER — Other Ambulatory Visit: Payer: Self-pay | Admitting: Internal Medicine

## 2011-06-21 DIAGNOSIS — Z1231 Encounter for screening mammogram for malignant neoplasm of breast: Secondary | ICD-10-CM

## 2011-07-19 ENCOUNTER — Ambulatory Visit
Admission: RE | Admit: 2011-07-19 | Discharge: 2011-07-19 | Disposition: A | Discharge status: Home / Self Care | Payer: No Typology Code available for payment source | Source: Ambulatory Visit | Attending: Internal Medicine | Admitting: Internal Medicine

## 2011-07-19 DIAGNOSIS — Z1231 Encounter for screening mammogram for malignant neoplasm of breast: Secondary | ICD-10-CM

## 2011-08-01 DIAGNOSIS — I10 Essential (primary) hypertension: Secondary | ICD-10-CM | POA: Insufficient documentation

## 2011-08-01 DIAGNOSIS — E119 Type 2 diabetes mellitus without complications: Secondary | ICD-10-CM | POA: Insufficient documentation

## 2011-08-01 DIAGNOSIS — R21 Rash and other nonspecific skin eruption: Secondary | ICD-10-CM | POA: Insufficient documentation

## 2011-08-02 ENCOUNTER — Encounter (HOSPITAL_COMMUNITY): Payer: Self-pay | Admitting: *Deleted

## 2011-08-02 ENCOUNTER — Emergency Department (HOSPITAL_COMMUNITY)
Admission: EM | Admit: 2011-08-02 | Discharge: 2011-08-02 | Disposition: A | Discharge status: Home / Self Care | Payer: No Typology Code available for payment source | Attending: Emergency Medicine | Admitting: Emergency Medicine

## 2011-08-02 DIAGNOSIS — T7840XA Allergy, unspecified, initial encounter: Secondary | ICD-10-CM

## 2011-08-02 HISTORY — DX: Essential (primary) hypertension: I10

## 2011-08-02 MED ORDER — HYDROXYZINE HCL 25 MG PO TABS
50.0000 mg | ORAL_TABLET | Freq: Once | ORAL | Status: AC
  Administered 2011-08-02: 50 mg via ORAL
  Filled 2011-08-02: qty 2

## 2011-08-02 MED ORDER — DIPHENHYDRAMINE HCL 25 MG PO CAPS: 25.0000 mg | ORAL_CAPSULE | Freq: Four times a day (QID) | ORAL | Status: DC | PRN

## 2011-08-02 MED ORDER — FAMOTIDINE 20 MG PO TABS: 20.0000 mg | ORAL_TABLET | Freq: Two times a day (BID) | ORAL | Status: DC

## 2011-08-02 MED ORDER — FAMOTIDINE 20 MG PO TABS
20.0000 mg | ORAL_TABLET | Freq: Once | ORAL | Status: AC
  Administered 2011-08-02: 20 mg via ORAL
  Filled 2011-08-02: qty 1

## 2011-08-02 MED ORDER — PREDNISONE 20 MG PO TABS
60.0000 mg | ORAL_TABLET | Freq: Once | ORAL | Status: AC
  Administered 2011-08-02: 60 mg via ORAL
  Filled 2011-08-02: qty 3

## 2011-08-02 MED ORDER — PREDNISONE 20 MG PO TABS: 60.0000 mg | ORAL_TABLET | Freq: Every day | ORAL | Status: AC

## 2011-08-02 NOTE — ED Provider Notes (Signed)
History     CSN: [id number]  Arrival date & time 08/01/11  2355   First MD Initiated Contact with Patient 08/02/11 0017      Chief Complaint  Patient presents with  . Allergic Reaction    (Consider location/radiation/quality/duration/timing/severity/associated sxs/prior treatment) HPI history provided by patient.rash developed on arms and torso yesterday. Patient took Benadryl with intermittent relief and tonight despite Benadryl itching and rash got worse. No associated difficulty breathing or difficulty swallowing. No wheezes. No tongue or lip or throat swelling. About 3 weeks ago medications were changed to a new blood pressure medication and she also started Neurontin. She has been taking his medications as prescribed without problems. She denies any new exposures, foods, detergents, soaps.no fevers. No recent travel.moderate in severity. No known aggravating factors. Past Medical History  Diagnosis Date  . Diabetes mellitus   . Sarcoidosis   . Hypertension     Past Surgical History  Procedure Date  . Cesarean section   . Cholecystectomy     No family history on file.  History  Substance Use Topics  . Smoking status: Never Smoker   . Smokeless tobacco: Not on file  . Alcohol Use: No    OB History    Grav Para Term Preterm Abortions TAB SAB Ect Mult Living                  Review of Systems  Constitutional: Negative for fever and chills.  HENT: Negative for neck pain and neck stiffness.   Eyes: Negative for pain.  Respiratory: Negative for shortness of breath.   Cardiovascular: Negative for chest pain.  Gastrointestinal: Negative for abdominal pain.  Genitourinary: Negative for dysuria.  Musculoskeletal: Negative for back pain.  Skin: Positive for rash.  Neurological: Negative for headaches.  All other systems reviewed and are negative.    Allergies  Erythromycin  Home Medications   Current Outpatient Rx  Name Route Sig Dispense Refill  . QUINAPRIL  HCL 10 MG PO TABS Oral Take 10 mg by mouth at bedtime.    . FUROSEMIDE 20 MG PO TABS Oral Take 20 mg by mouth daily.      [name] GLIPIZIDE 10 MG PO TABS Oral Take 10 mg by mouth 2 (two) times daily.      . INSULIN GLARGINE 100 UNIT/ML [place] SOLN Subcutaneous Inject 40 Units into the skin at bedtime.      [name] LISINOPRIL 10 MG PO TABS Oral Take 10 mg by mouth daily.      [name] METFORMIN HCL 1000 MG PO TABS Oral Take 1,000 mg by mouth 2 (two) times daily.        BP 123/62  Pulse 85  Temp(Src) 98.2 F (36.8 C) (Oral)  Resp 20  Ht 5\' 4"  (1.626 m)  Wt 273 lb (123.832 kg)  BMI 46.86 kg/m2  SpO2 97%  Physical Exam  Constitutional: She is oriented to person, place, and time. She appears well-developed and well-nourished.  HENT:  Head: Normocephalic and atraumatic.  Eyes: Conjunctivae and EOM are normal. Pupils are equal, round, and reactive to light.  Neck: Trachea normal. Neck supple. No thyromegaly present.  Cardiovascular: Normal rate, regular rhythm, S1 normal, S2 normal and normal pulses.     No systolic murmur is present   No diastolic murmur is present  Pulses:      Radial pulses are 2+ on the right side, and 2+ on the left side.  Pulmonary/Chest: Effort normal and breath sounds normal. She has no  wheezes. She has no rhonchi. She has no rales. She exhibits no tenderness.  Abdominal: Soft. Normal appearance and bowel sounds are normal. There is no tenderness. There is no CVA tenderness and negative Murphy's sign.  Musculoskeletal:       BLE:s Calves nontender, no cords or erythema, negative Homans sign  Neurological: She is alert and oriented to person, place, and time. She has normal strength. No cranial nerve deficit or sensory deficit. GCS eye subscore is 4. GCS verbal subscore is 5. GCS motor subscore is 6.  Skin: Skin is warm and dry. She is not diaphoretic.       Urticarial and blanching rash involving the torso and arms. Does not involve hands or mouth.  Psychiatric: Her speech is normal.         Cooperative and appropriate    ED Course  Procedures (including critical care time)  Prednisone. Benadryl. Pepcid.  Recheck at 1:50 AM, rash and itching resolved. No airway involvement. MDM   Allergic reaction without anaphylaxis or airway involvement. Improved with medications as above. Plan prescription for the same and close followup with primary care physician Dr. Felecia Shelling for review of medications and further evaluation.        Sunnie Nielsen, MD 08/02/11 [phone]

## 2011-08-02 NOTE — ED Notes (Signed)
Reports rash and itching x 3 days, worse tonight; states has been using Benadryl for itching with relief, but did not help tonight; hives noted to RL abdomen.  C/o itching to arms, abdomen and back. Denies shortness of breath.  Denies any new medications, foods, soaps, lotions, or cleaning products.

## 2011-08-02 NOTE — ED Notes (Signed)
Alert, in no distress; instructions/prescriptions reviewed and f/u information provided; verbalizes understanding.  

## 2011-08-02 NOTE — Discharge Instructions (Signed)

## 2011-12-23 ENCOUNTER — Encounter (HOSPITAL_COMMUNITY): Payer: Self-pay | Admitting: *Deleted

## 2011-12-23 ENCOUNTER — Emergency Department (HOSPITAL_COMMUNITY)
Admission: EM | Admit: 2011-12-23 | Discharge: 2011-12-23 | Disposition: A | Discharge status: Home / Self Care | Payer: No Typology Code available for payment source | Attending: Emergency Medicine | Admitting: Emergency Medicine

## 2011-12-23 DIAGNOSIS — L03319 Cellulitis of trunk, unspecified: Secondary | ICD-10-CM | POA: Insufficient documentation

## 2011-12-23 DIAGNOSIS — I10 Essential (primary) hypertension: Secondary | ICD-10-CM | POA: Insufficient documentation

## 2011-12-23 DIAGNOSIS — E119 Type 2 diabetes mellitus without complications: Secondary | ICD-10-CM | POA: Insufficient documentation

## 2011-12-23 DIAGNOSIS — Z79899 Other long term (current) drug therapy: Secondary | ICD-10-CM | POA: Insufficient documentation

## 2011-12-23 DIAGNOSIS — L0291 Cutaneous abscess, unspecified: Secondary | ICD-10-CM

## 2011-12-23 DIAGNOSIS — L02219 Cutaneous abscess of trunk, unspecified: Secondary | ICD-10-CM | POA: Insufficient documentation

## 2011-12-23 DIAGNOSIS — D869 Sarcoidosis, unspecified: Secondary | ICD-10-CM | POA: Insufficient documentation

## 2011-12-23 DIAGNOSIS — Z794 Long term (current) use of insulin: Secondary | ICD-10-CM | POA: Insufficient documentation

## 2011-12-23 MED ORDER — AMOXICILLIN-POT CLAVULANATE 875-125 MG PO TABS
1.0000 | ORAL_TABLET | Freq: Once | ORAL | Status: AC
  Administered 2011-12-23: 1 via ORAL
  Filled 2011-12-23: qty 1

## 2011-12-23 MED ORDER — HYDROCODONE-ACETAMINOPHEN 7.5-325 MG PO TABS: 1.0000 | ORAL_TABLET | Freq: Four times a day (QID) | ORAL | Status: DC | PRN

## 2011-12-23 MED ORDER — DOXYCYCLINE HYCLATE 100 MG PO CAPS: 100.0000 mg | ORAL_CAPSULE | Freq: Two times a day (BID) | ORAL | Status: DC

## 2011-12-23 MED ORDER — AMOXICILLIN 875 MG PO TABS: 875.0000 mg | ORAL_TABLET | Freq: Two times a day (BID) | ORAL | Status: DC

## 2011-12-23 MED ORDER — DOXYCYCLINE HYCLATE 100 MG PO TABS
100.0000 mg | ORAL_TABLET | Freq: Once | ORAL | Status: AC
  Administered 2011-12-23: 100 mg via ORAL
  Filled 2011-12-23: qty 1

## 2011-12-23 NOTE — ED Notes (Signed)
Abscess to pelvic region since Sunday, Had been draining.

## 2011-12-23 NOTE — ED Provider Notes (Signed)
Medical screening examination/treatment/procedure(s) were performed by non-physician practitioner and as supervising physician I was immediately available for consultation/collaboration.   Benny Lennert, MD 12/23/11 [phone]

## 2011-12-23 NOTE — ED Provider Notes (Signed)
History     CSN: [id number]  Arrival date & time 12/23/11  1509   First MD Initiated Contact with Patient 12/23/11 1646      Chief Complaint  Patient presents with  . Abscess    (Consider location/radiation/quality/duration/timing/severity/associated sxs/prior treatment) Patient is a 61 y.o. female presenting with abscess. The history is provided by the patient.  Abscess  This is a new problem. The current episode started less than one week ago. The problem occurs frequently. The problem has been gradually worsening. The abscess is present on the groin (paraneum). The problem is moderate. The abscess is characterized by redness and painfulness. Pertinent negatives include no fever, no vomiting and no cough. Past medical history comments: diabetes. There were no sick contacts.    Past Medical History  Diagnosis Date  . Diabetes mellitus   . Sarcoidosis   . Hypertension     Past Surgical History  Procedure Date  . Cesarean section   . Cholecystectomy     History reviewed. No pertinent family history.  History  Substance Use Topics  . Smoking status: Never Smoker   . Smokeless tobacco: Not on file  . Alcohol Use: No    OB History    Grav Para Term Preterm Abortions TAB SAB Ect Mult Living                  Review of Systems  Constitutional: Negative for fever and activity change.       All ROS Neg except as noted in HPI  HENT: Negative for nosebleeds and neck pain.   Eyes: Negative for photophobia and discharge.  Respiratory: Negative for cough, shortness of breath and wheezing.   Cardiovascular: Negative for chest pain and palpitations.  Gastrointestinal: Negative for vomiting, abdominal pain and blood in stool.  Genitourinary: Negative for dysuria, frequency and hematuria.  Musculoskeletal: Positive for arthralgias. Negative for back pain.  Skin: Negative.   Neurological: Negative for dizziness, seizures and speech difficulty.  Psychiatric/Behavioral:  Negative for hallucinations and confusion.    Allergies  Erythromycin  Home Medications   Current Outpatient Rx  Name Route Sig Dispense Refill  . DIPHENHYDRAMINE HCL 25 MG PO CAPS Oral Take 1 capsule (25 mg total) by mouth every 6 (six) hours as needed for itching. 30 capsule 0  . FUROSEMIDE 20 MG PO TABS Oral Take 20 mg by mouth daily.      [name] GLIPIZIDE 10 MG PO TABS Oral Take 10 mg by mouth 2 (two) times daily.      . INSULIN GLARGINE 100 UNIT/ML [place] SOLN Subcutaneous Inject 40 Units into the skin at bedtime.      [name] METFORMIN HCL 1000 MG PO TABS Oral Take 1,000 mg by mouth 2 (two) times daily.      . QUINAPRIL HCL 10 MG PO TABS Oral Take 10 mg by mouth daily.       BP 136/70  Pulse 91  Temp 98.5 F (36.9 C) (Oral)  Resp 18  Ht 5\' 4"  (1.626 m)  Wt 279 lb (126.554 kg)  BMI 47.89 kg/m2  SpO2 98%  Physical Exam  Nursing note and vitals reviewed. Constitutional: She is oriented to person, place, and time. She appears well-developed and well-nourished.  Non-toxic appearance.  HENT:  Head: Normocephalic.  Right Ear: Tympanic membrane and external ear normal.  Left Ear: Tympanic membrane and external ear normal.  Eyes: EOM and lids are normal. Pupils are equal, round, and reactive to light.  Neck: Normal range  of motion. Neck supple. Carotid bruit is not present.  Cardiovascular: Normal rate, regular rhythm, normal heart sounds, intact distal pulses and normal pulses.   Pulmonary/Chest: Breath sounds normal. No respiratory distress.  Abdominal: Soft. Bowel sounds are normal. There is no tenderness. There is no guarding.  Genitourinary:       Abscess noted of the left perineal area. Increase redness present but no red streaking. The abscess does not communicate with the vagina. The abscess does not medicate with the rectum. Chaperone present during examination.  Musculoskeletal: Normal range of motion.  Lymphadenopathy:       Head (right side): No submandibular adenopathy present.        Head (left side): No submandibular adenopathy present.    She has no cervical adenopathy.  Neurological: She is alert and oriented to person, place, and time. She has normal strength. No cranial nerve deficit or sensory deficit.  Skin: Skin is warm and dry.  Psychiatric: She has a normal mood and affect. Her speech is normal.    ED Course  Procedures : I AND D OF ABSCESS LEFT PERINEUM. - Patient identified by arm band. Permission for the procedure given by the patient. Procedural time out taken before incision and drainage of abscess of the left perineal area. The patient was placed in stirrups. The abscess area was painted with Betadine. The abscess area was infiltrated with 2% plain lidocaine. After adequate anesthesia, incision and drainage was carried out with an 11 blade scalpel. Copious amount of pus like material was evacuated from the abscess area. A culture was obtained and sent to the lab. Loculations were broken with forcep. The area was then irrigated with normal saline. The area was packed with one quarter inch iodoform gauze and sterile dressing applied by me. Patient tolerated the procedure without problem or complication.  Labs Reviewed - No data to display No results found.   No diagnosis found.    MDM  I have reviewed nursing notes, vital signs, and all appropriate lab and imaging results for this patient. Patient presents to the emergency department with four-day history of abscess in the perineal area. It is of note that this patient is an insulin requiring diabetic. The patient denies any high fever or chills since the onset of this abscess. The patient underwent incision and drainage of the abscess area without complication.  The plan at this time is for the patient to be on Amoxil 875 mg 2 times daily, doxycycline 2 times daily, and Norco 7.5 mg for pain #20 tablets. Patient is to have the packing removed on October 1.       Kathie Dike, [place] 12/23/11  1819

## 2011-12-27 LAB — CULTURE, ROUTINE-ABSCESS

## 2012-04-22 ENCOUNTER — Emergency Department (HOSPITAL_COMMUNITY)
Admission: EM | Admit: 2012-04-22 | Discharge: 2012-04-22 | Disposition: A | Discharge status: Home / Self Care | Payer: BC Managed Care – PPO | Attending: Emergency Medicine | Admitting: Emergency Medicine

## 2012-04-22 ENCOUNTER — Encounter (HOSPITAL_COMMUNITY): Payer: Self-pay | Admitting: Emergency Medicine

## 2012-04-22 DIAGNOSIS — Z794 Long term (current) use of insulin: Secondary | ICD-10-CM | POA: Insufficient documentation

## 2012-04-22 DIAGNOSIS — M545 Low back pain, unspecified: Secondary | ICD-10-CM | POA: Insufficient documentation

## 2012-04-22 DIAGNOSIS — Z8739 Personal history of other diseases of the musculoskeletal system and connective tissue: Secondary | ICD-10-CM | POA: Insufficient documentation

## 2012-04-22 DIAGNOSIS — Z79899 Other long term (current) drug therapy: Secondary | ICD-10-CM | POA: Insufficient documentation

## 2012-04-22 DIAGNOSIS — I10 Essential (primary) hypertension: Secondary | ICD-10-CM | POA: Insufficient documentation

## 2012-04-22 DIAGNOSIS — E119 Type 2 diabetes mellitus without complications: Secondary | ICD-10-CM | POA: Insufficient documentation

## 2012-04-22 DIAGNOSIS — L0231 Cutaneous abscess of buttock: Secondary | ICD-10-CM | POA: Insufficient documentation

## 2012-04-22 LAB — GLUCOSE, CAPILLARY: Glucose-Capillary: 178 mg/dL — ABNORMAL HIGH (ref 70–99)

## 2012-04-22 MED ORDER — OXYCODONE-ACETAMINOPHEN 5-325 MG PO TABS
2.0000 | ORAL_TABLET | Freq: Once | ORAL | Status: AC
  Administered 2012-04-22: 2 via ORAL
  Filled 2012-04-22: qty 2

## 2012-04-22 MED ORDER — HYDROCODONE-ACETAMINOPHEN 5-325 MG PO TABS: 1.0000 | ORAL_TABLET | ORAL | Status: DC | PRN

## 2012-04-22 MED ORDER — SULFAMETHOXAZOLE-TRIMETHOPRIM 800-160 MG PO TABS: 1.0000 | ORAL_TABLET | Freq: Two times a day (BID) | ORAL | Status: DC

## 2012-04-22 MED ORDER — LIDOCAINE HCL (PF) 1 % IJ SOLN
INTRAMUSCULAR | Status: AC
  Filled 2012-04-22: qty 5

## 2012-04-22 NOTE — ED Notes (Addendum)
Pt presents with abscess to left buttocks. Pt states "I think it busted while I was in the waiting room". Pt reports pain and drainage in area of abscess.

## 2012-04-22 NOTE — ED Notes (Signed)
Patient c/o abscess on left buttock x1 week. Per patient abscessed ruptured and drained while waiting in waiting room. Patient c/o lower back pain that started yesterday. Denies any known injury.

## 2012-04-22 NOTE — ED Provider Notes (Signed)
Medical screening examination/treatment/procedure(s) were performed by non-physician practitioner and as supervising physician I was immediately available for consultation/collaboration.    Celene Kras, MD 04/22/12 [phone]

## 2012-04-22 NOTE — ED Provider Notes (Signed)
History     CSN: [id number]  Arrival date & time 04/22/12  1006   First MD Initiated Contact with Patient 04/22/12 1143      Chief Complaint  Patient presents with  . Back Pain  . Abscess    HPI Ann Roberson is a 62 y.o. female who presents to the ED with an abscess. The abscess is located on the right buttock. This is a recurrent problem. She rates the pain as 8/10. The area started to drain a little while sitting in the waiting room. She also complains of low back pain that is worse on the left side of her back. She denies fever, chills, nausea or vomiting. She reports having low back pain that she is not sure if it is related to the abscess. The history was provided by the patient.  Past Medical History  Diagnosis Date  . Diabetes mellitus   . Sarcoidosis   . Hypertension     Past Surgical History  Procedure Date  . Cesarean section   . Cholecystectomy     History reviewed. No pertinent family history.  History  Substance Use Topics  . Smoking status: Never Smoker   . Smokeless tobacco: Never Used  . Alcohol Use: No    OB History    Grav Para Term Preterm Abortions TAB SAB Ect Mult Living   1 1 1       1       Review of Systems  Constitutional: Negative for fever and chills.  HENT: Negative.   Eyes: Negative.   Respiratory: Negative for shortness of breath.   Cardiovascular: Negative for chest pain and palpitations.  Gastrointestinal: Negative for nausea, vomiting and abdominal pain.  Musculoskeletal: Positive for back pain.  Skin:       Abscess left buttock    Allergies  Erythromycin  Home Medications   Current Outpatient Rx  Name  Route  Sig  Dispense  Refill  . AMOXICILLIN 875 MG PO TABS   Oral   Take 1 tablet (875 mg total) by mouth 2 (two) times daily.   14 tablet   0   . DIPHENHYDRAMINE HCL 25 MG PO CAPS   Oral   Take 1 capsule (25 mg total) by mouth every 6 (six) hours as needed for itching.   30 capsule   0   . DOXYCYCLINE  HYCLATE 100 MG PO CAPS   Oral   Take 1 capsule (100 mg total) by mouth 2 (two) times daily.   14 capsule   0   . FUROSEMIDE 20 MG PO TABS   Oral   Take 20 mg by mouth daily.           [name] GLIPIZIDE 10 MG PO TABS   Oral   Take 10 mg by mouth 2 (two) times daily.           [name] HYDROCODONE-ACETAMINOPHEN 7.5-325 MG PO TABS   Oral   Take 1 tablet by mouth every 6 (six) hours as needed for pain.   20 tablet   0   . INSULIN GLARGINE 100 UNIT/ML  SOLN   Subcutaneous   Inject 40 Units into the skin at bedtime.           [name] METFORMIN HCL 1000 MG PO TABS   Oral   Take 1,000 mg by mouth 2 (two) times daily.           . QUINAPRIL HCL 10 MG PO TABS   Oral  Take 10 mg by mouth daily.            BP 144/69  Pulse 104  Temp 98.4 F (36.9 C) (Oral)  Resp 18  Ht 5\' 2"  (1.575 m)  Wt 279 lb (126.554 kg)  BMI 51.03 kg/m2  SpO2 99%  Physical Exam  Nursing note and vitals reviewed. Constitutional: She is oriented to person, place, and time. No distress.       Morbidly obese A/A female  HENT:  Head: Normocephalic and atraumatic.  Eyes: EOM are normal. Pupils are equal, round, and reactive to light.  Neck: Neck supple.  Cardiovascular:       Tachycardia   Pulmonary/Chest: Effort normal.  Abdominal: Soft. There is no tenderness.  Genitourinary:     Musculoskeletal: Normal range of motion. She exhibits no edema.       Pain in left buttock area.  Neurological: She is alert and oriented to person, place, and time. She has normal strength. No cranial nerve deficit or sensory deficit. Gait normal.  Skin: Skin is warm and dry.  Psychiatric: She has a normal mood and affect. Her behavior is normal. Judgment and thought content normal.   Procedures  INCISION AND DRAINAGE Performed by: NEESE,HOPE Consent: Verbal consent obtained. Risks and benefits: risks, benefits and alternatives were discussed Type: abscess  Body area: left buttock  Anesthesia: local  infiltration  Incision was made with a scalpel # 11 blade  Local anesthetic: lidocaine 1%   Anesthetic total: 2 ml  Complexity: complex Blunt dissection to break up loculations  Drainage: purulent  Drainage amount: large  Packing material: 1/4 in iodoform gauze  Patient tolerance: Patient tolerated the procedure well with no immediate complications.   After I&D patient states her back pain is better  Assessment: 62 y.o. female with abscess to left buttocks  Plan:  Bactrim DS   Hydrocodone   Sitz baths   Check blood sugars more often   Return in 2 days for recheck or sooner for problems. Discussed with the patient and all questioned fully answered. She will return in 2 days or sooner if any problems arise.   Medication List     As of 04/22/2012  2:01 PM    START taking these medications         * HYDROcodone-acetaminophen 5-325 MG per tablet   Commonly known as: NORCO/VICODIN   Take 1 tablet by mouth every 4 (four) hours as needed for pain.      sulfamethoxazole-trimethoprim 800-160 MG per tablet   Commonly known as: BACTRIM DS,SEPTRA DS   Take 1 tablet by mouth every 12 (twelve) hours.     * Notice: This list has 1 medication(s) that are the same as other medications prescribed for you. Read the directions carefully, and ask your doctor or other care provider to review them with you.    ASK your doctor about these medications         amoxicillin 875 MG tablet   Commonly known as: AMOXIL   Take 1 tablet (875 mg total) by mouth 2 (two) times daily.      diphenhydrAMINE 25 mg capsule   Commonly known as: BENADRYL   Take 1 capsule (25 mg total) by mouth every 6 (six) hours as needed for itching.      doxycycline 100 MG capsule   Commonly known as: VIBRAMYCIN   Take 1 capsule (100 mg total) by mouth 2 (two) times daily.      furosemide 20 MG  tablet   Commonly known as: LASIX      glipiZIDE 10 MG tablet   Commonly known as: GLUCOTROL      *  HYDROcodone-acetaminophen 7.5-325 MG per tablet   Commonly known as: NORCO   Take 1 tablet by mouth every 6 (six) hours as needed for pain.      insulin glargine 100 UNIT/ML injection   Commonly known as: LANTUS      metFORMIN 1000 MG tablet   Commonly known as: GLUCOPHAGE      quinapril 10 MG tablet   Commonly known as: ACCUPRIL     * Notice: This list has 1 medication(s) that are the same as other medications prescribed for you. Read the directions carefully, and ask your doctor or other care provider to review them with you.        Where to get your medications    These are the prescriptions that you need to pick up.   You may get these medications from any pharmacy.         HYDROcodone-acetaminophen 5-325 MG per tablet   sulfamethoxazole-trimethoprim 800-160 MG per tablet                 Janne Napoleon, NP 04/22/12 1402

## 2012-09-05 ENCOUNTER — Other Ambulatory Visit (HOSPITAL_COMMUNITY): Payer: Self-pay | Admitting: Physician Assistant

## 2012-09-05 DIAGNOSIS — Z139 Encounter for screening, unspecified: Secondary | ICD-10-CM

## 2012-09-11 ENCOUNTER — Ambulatory Visit (HOSPITAL_COMMUNITY): Payer: BC Managed Care – PPO

## 2012-09-11 ENCOUNTER — Ambulatory Visit (HOSPITAL_COMMUNITY)
Admission: RE | Admit: 2012-09-11 | Discharge: 2012-09-11 | Disposition: A | Discharge status: Home / Self Care | Payer: BC Managed Care – PPO | Source: Ambulatory Visit | Attending: Physician Assistant | Admitting: Physician Assistant

## 2012-09-11 DIAGNOSIS — Z1231 Encounter for screening mammogram for malignant neoplasm of breast: Secondary | ICD-10-CM | POA: Insufficient documentation

## 2012-09-11 DIAGNOSIS — Z139 Encounter for screening, unspecified: Secondary | ICD-10-CM

## 2013-08-29 ENCOUNTER — Other Ambulatory Visit (HOSPITAL_COMMUNITY): Payer: Self-pay | Admitting: Physician Assistant

## 2013-08-29 DIAGNOSIS — Z1231 Encounter for screening mammogram for malignant neoplasm of breast: Secondary | ICD-10-CM

## 2013-09-03 ENCOUNTER — Ambulatory Visit (HOSPITAL_COMMUNITY)
Admission: RE | Admit: 2013-09-03 | Discharge: 2013-09-03 | Disposition: A | Discharge status: Home / Self Care | Payer: BC Managed Care – PPO | Source: Ambulatory Visit | Attending: Physician Assistant | Admitting: Physician Assistant

## 2013-09-03 DIAGNOSIS — Z1231 Encounter for screening mammogram for malignant neoplasm of breast: Secondary | ICD-10-CM

## 2013-11-12 ENCOUNTER — Ambulatory Visit (INDEPENDENT_AMBULATORY_CARE_PROVIDER_SITE_OTHER): Payer: BC Managed Care – PPO | Admitting: Orthopedic Surgery

## 2013-11-12 VITALS — BP 140/83 | Ht 62.0 in | Wt 272.0 lb

## 2013-11-12 DIAGNOSIS — M2392 Unspecified internal derangement of left knee: Secondary | ICD-10-CM

## 2013-11-12 DIAGNOSIS — M2390 Unspecified internal derangement of unspecified knee: Secondary | ICD-10-CM

## 2013-11-12 MED ORDER — HYDROCODONE-ACETAMINOPHEN 5-325 MG PO TABS: 1.0000 | ORAL_TABLET | ORAL | Status: DC | PRN

## 2013-11-12 NOTE — Patient Instructions (Signed)
We will schedule MRI and call you with appointment Use cane ALL THE TIME

## 2013-11-12 NOTE — Progress Notes (Signed)
Chief Complaint  Patient presents with  . Knee Pain    Left  knee pain x 6 months, DOI 2009    Last seen back in 2009 presents now with pain left knee had a brief period where she had apical 2 weightbearing presents with a limp for 6 months with catching locking and stiffness and posterior knee pain. She does report some numbness most the pain is behind the knee is 10 out of 10 and is unrelieved by hydrocodone and Motrin she went to the ER x-rays show moderate to severe arthritis  Past Medical History  Diagnosis Date  . Diabetes mellitus   . Sarcoidosis   . Hypertension    Past Surgical History  Procedure Laterality Date  . Cesarean section    . Cholecystectomy     Review of systems has been recorded reviewed and signed and scanned into the chart  Medications glipizide ER 10 mg metformin thousand milligrams twice a day gabapentin 3 mg 3 times a day lisinopril 20 mg daily furosemide 40 mg daily insulin Lantus  42 units subcutaneous daily   Vital signs are stable as recorded BP 140/83  Ht 5\' 2"  (1.575 m)  Wt 272 lb (123.378 kg)  BMI 49.74 kg/m2   General appearance is normal, body habitus grossly obese  The patient is alert and oriented x 3  The patient's mood and affect are normal  Gait assessment: She had inability to bear weight with the leg almost giving out from under her  The cardiovascular exam reveals normal pulses and temperature without edema or  swelling.  The lymphatic system is negative for palpable lymph nodes  The sensory exam is normal.  There are no pathologic reflexes. Balance could not assess secondary to the severe inability to bear weight  Exam of the left knee shows only 20 of flexion she cannot flex the knee or fully extend the knee stability is confirmed by the drawer testing Strength grade 5 motor strength  Skin normal, no rash, or laceration. Provocative tests Murray's is positive for medial meniscal tear  A/P X-ray show osteoarthritis  moderate to severe  Plan MRI left knee  Meds ordered this encounter  Medications  . HYDROcodone-acetaminophen (NORCO/VICODIN) 5-325 MG per tablet    Sig: Take 1 tablet by mouth every 4 (four) hours as needed.    Dispense:  90 tablet    Refill:  0

## 2013-12-03 ENCOUNTER — Ambulatory Visit
Admission: RE | Admit: 2013-12-03 | Discharge: 2013-12-03 | Disposition: A | Discharge status: Home / Self Care | Payer: BC Managed Care – PPO | Source: Ambulatory Visit | Attending: Orthopedic Surgery | Admitting: Orthopedic Surgery

## 2013-12-03 ENCOUNTER — Other Ambulatory Visit: Payer: Self-pay | Admitting: Orthopedic Surgery

## 2013-12-03 DIAGNOSIS — M2392 Unspecified internal derangement of left knee: Secondary | ICD-10-CM

## 2013-12-05 ENCOUNTER — Ambulatory Visit: Payer: BC Managed Care – PPO | Admitting: Sports Medicine

## 2013-12-05 ENCOUNTER — Telehealth: Payer: Self-pay | Admitting: Orthopedic Surgery

## 2013-12-05 NOTE — Telephone Encounter (Signed)
PATIENT AWARE

## 2013-12-05 NOTE — Telephone Encounter (Signed)
Ann Roberson says her knee pain has got worse and the Hydrocodone 5/325 is not touching the pain. Says it has been worse since she had the MRI this week. Has an appointment with you 12/10/13, but asked if you can prescribe something else.

## 2013-12-05 NOTE — Telephone Encounter (Signed)
NO

## 2013-12-05 NOTE — Telephone Encounter (Signed)
Routing to Dr Harrison 

## 2013-12-10 ENCOUNTER — Ambulatory Visit (INDEPENDENT_AMBULATORY_CARE_PROVIDER_SITE_OTHER): Payer: BC Managed Care – PPO | Admitting: Orthopedic Surgery

## 2013-12-10 VITALS — Ht 62.0 in | Wt 272.0 lb

## 2013-12-10 DIAGNOSIS — M1710 Unilateral primary osteoarthritis, unspecified knee: Secondary | ICD-10-CM

## 2013-12-10 DIAGNOSIS — M25562 Pain in left knee: Secondary | ICD-10-CM

## 2013-12-10 DIAGNOSIS — M1712 Unilateral primary osteoarthritis, left knee: Secondary | ICD-10-CM

## 2013-12-10 DIAGNOSIS — M23301 Other meniscus derangements, unspecified lateral meniscus, left knee: Secondary | ICD-10-CM

## 2013-12-10 DIAGNOSIS — M25569 Pain in unspecified knee: Secondary | ICD-10-CM

## 2013-12-10 DIAGNOSIS — M23302 Other meniscus derangements, unspecified lateral meniscus, unspecified knee: Secondary | ICD-10-CM

## 2013-12-10 MED ORDER — IBUPROFEN 800 MG PO TABS: 800.0000 mg | ORAL_TABLET | Freq: Three times a day (TID) | ORAL | Status: DC

## 2013-12-10 NOTE — Progress Notes (Signed)
Chief Complaint  Patient presents with  . Results    MRI results, Left knee    IMPRESSION: 1. Severe osteoarthritis. 2. Small grade 3 oblique tear of the anterior horn lateral meniscus near the midbody, involving the inferior meniscal surface. 3. Trace knee effusion with small Baker's cyst. 4. Free osteochondral fragment posterior to the PCL. 5. Mild pes anserine bursitis.   The patient continues to complain of leg pain out of proportion to her MRI findings. She gets more relief from ibuprofen and she does from hydrocodone and her knee feels better when she's walking on it and when she's not. After thorough review of this and reviewing her clinical symptoms it would seem to me that something else is gone on in her knee I put her on 100 ibuprofen 3 times a day to see if this would help I don't think steroids should be used because of her diabetes  She denies any back pain and muscular system review and there is no numbness or tingling in the leg and she starting on gabapentin 600 mg 3 times a day  Leg pain Osteoarthritis Lateral meniscal tear

## 2013-12-10 NOTE — Patient Instructions (Signed)
Ibuprofen sent to pharmacy.

## 2013-12-31 ENCOUNTER — Ambulatory Visit (INDEPENDENT_AMBULATORY_CARE_PROVIDER_SITE_OTHER): Payer: BC Managed Care – PPO | Admitting: Orthopedic Surgery

## 2013-12-31 ENCOUNTER — Encounter: Payer: Self-pay | Admitting: Orthopedic Surgery

## 2013-12-31 VITALS — BP 138/78 | Ht 62.0 in | Wt 272.0 lb

## 2013-12-31 DIAGNOSIS — M25562 Pain in left knee: Secondary | ICD-10-CM

## 2013-12-31 DIAGNOSIS — M23307 Other meniscus derangements, unspecified meniscus, left knee: Secondary | ICD-10-CM

## 2013-12-31 DIAGNOSIS — M23304 Other meniscus derangements, unspecified medial meniscus, left knee: Secondary | ICD-10-CM

## 2013-12-31 DIAGNOSIS — M1712 Unilateral primary osteoarthritis, left knee: Secondary | ICD-10-CM

## 2013-12-31 DIAGNOSIS — M23301 Other meniscus derangements, unspecified lateral meniscus, left knee: Secondary | ICD-10-CM

## 2013-12-31 DIAGNOSIS — M2392 Unspecified internal derangement of left knee: Secondary | ICD-10-CM

## 2013-12-31 NOTE — Patient Instructions (Signed)
Consider knee scope surgery and call when you are ready

## 2013-12-31 NOTE — Progress Notes (Signed)
Chief Complaint  Patient presents with  . Follow-up    3 week recheck left knee response to medication    Recheck left knee still having pain and burning around the knee joint no back pain no radicular symptoms. She did have a locking episode in the [employer]  At this point I told her we can scope the knee and try to clean it up over the best to be out of work 4 weeks she said she would call me and we can schedule it when she calls if she decides to have the arthroscopic surgery and debridement and medial meniscectomy

## 2014-01-27 ENCOUNTER — Encounter: Payer: Self-pay | Admitting: Orthopedic Surgery

## 2014-02-08 ENCOUNTER — Emergency Department (HOSPITAL_COMMUNITY)
Admission: EM | Admit: 2014-02-08 | Discharge: 2014-02-08 | Disposition: A | Discharge status: Home / Self Care | Payer: BC Managed Care – PPO | Attending: Emergency Medicine | Admitting: Emergency Medicine

## 2014-02-08 ENCOUNTER — Emergency Department (HOSPITAL_COMMUNITY): Payer: BC Managed Care – PPO

## 2014-02-08 ENCOUNTER — Encounter (HOSPITAL_COMMUNITY): Payer: Self-pay

## 2014-02-08 DIAGNOSIS — S8992XA Unspecified injury of left lower leg, initial encounter: Secondary | ICD-10-CM | POA: Insufficient documentation

## 2014-02-08 DIAGNOSIS — Y92410 Unspecified street and highway as the place of occurrence of the external cause: Secondary | ICD-10-CM | POA: Insufficient documentation

## 2014-02-08 DIAGNOSIS — Y998 Other external cause status: Secondary | ICD-10-CM | POA: Insufficient documentation

## 2014-02-08 DIAGNOSIS — M25512 Pain in left shoulder: Secondary | ICD-10-CM

## 2014-02-08 DIAGNOSIS — Z794 Long term (current) use of insulin: Secondary | ICD-10-CM | POA: Diagnosis not present

## 2014-02-08 DIAGNOSIS — S4992XA Unspecified injury of left shoulder and upper arm, initial encounter: Secondary | ICD-10-CM | POA: Insufficient documentation

## 2014-02-08 DIAGNOSIS — Z862 Personal history of diseases of the blood and blood-forming organs and certain disorders involving the immune mechanism: Secondary | ICD-10-CM | POA: Diagnosis not present

## 2014-02-08 DIAGNOSIS — E119 Type 2 diabetes mellitus without complications: Secondary | ICD-10-CM | POA: Insufficient documentation

## 2014-02-08 DIAGNOSIS — Y9389 Activity, other specified: Secondary | ICD-10-CM | POA: Insufficient documentation

## 2014-02-08 DIAGNOSIS — Z79899 Other long term (current) drug therapy: Secondary | ICD-10-CM | POA: Diagnosis not present

## 2014-02-08 DIAGNOSIS — I10 Essential (primary) hypertension: Secondary | ICD-10-CM | POA: Diagnosis not present

## 2014-02-08 DIAGNOSIS — Z791 Long term (current) use of non-steroidal anti-inflammatories (NSAID): Secondary | ICD-10-CM | POA: Insufficient documentation

## 2014-02-08 DIAGNOSIS — Z7982 Long term (current) use of aspirin: Secondary | ICD-10-CM | POA: Diagnosis not present

## 2014-02-08 DIAGNOSIS — M25562 Pain in left knee: Secondary | ICD-10-CM
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
## 2014-08-04 ENCOUNTER — Other Ambulatory Visit (HOSPITAL_COMMUNITY): Payer: Self-pay | Admitting: Internal Medicine

## 2014-08-04 DIAGNOSIS — Z1231 Encounter for screening mammogram for malignant neoplasm of breast: Secondary | ICD-10-CM

## 2014-09-01 ENCOUNTER — Ambulatory Visit (HOSPITAL_COMMUNITY)
Admission: RE | Admit: 2014-09-01 | Discharge: 2014-09-01 | Disposition: A | Discharge status: Home / Self Care | Payer: BLUE CROSS/BLUE SHIELD | Source: Ambulatory Visit | Attending: Internal Medicine | Admitting: Internal Medicine

## 2014-09-01 DIAGNOSIS — Z1231 Encounter for screening mammogram for malignant neoplasm of breast: Secondary | ICD-10-CM | POA: Insufficient documentation

## 2015-05-14 ENCOUNTER — Encounter: Payer: Self-pay | Admitting: Internal Medicine

## 2015-06-01 ENCOUNTER — Emergency Department (HOSPITAL_COMMUNITY)
Admission: EM | Admit: 2015-06-01 | Discharge: 2015-06-02 | Disposition: A | Discharge status: Home / Self Care | Payer: BLUE CROSS/BLUE SHIELD | Attending: Emergency Medicine | Admitting: Emergency Medicine

## 2015-06-01 ENCOUNTER — Encounter (HOSPITAL_COMMUNITY): Payer: Self-pay | Admitting: *Deleted

## 2015-06-01 DIAGNOSIS — I10 Essential (primary) hypertension: Secondary | ICD-10-CM | POA: Insufficient documentation

## 2015-06-01 DIAGNOSIS — E119 Type 2 diabetes mellitus without complications: Secondary | ICD-10-CM | POA: Insufficient documentation

## 2015-06-01 DIAGNOSIS — T783XXA Angioneurotic edema, initial encounter: Secondary | ICD-10-CM

## 2015-06-01 DIAGNOSIS — Z794 Long term (current) use of insulin: Secondary | ICD-10-CM | POA: Insufficient documentation

## 2015-06-01 DIAGNOSIS — Z79899 Other long term (current) drug therapy: Secondary | ICD-10-CM | POA: Insufficient documentation

## 2015-06-01 DIAGNOSIS — T7849XA Other allergy, initial encounter: Secondary | ICD-10-CM | POA: Diagnosis present

## 2015-06-01 LAB — CBG MONITORING, ED: Glucose-Capillary: 244 mg/dL — ABNORMAL HIGH (ref 65–99)

## 2015-06-01 MED ORDER — FAMOTIDINE IN NACL 20-0.9 MG/50ML-% IV SOLN
20.0000 mg | Freq: Once | INTRAVENOUS | Status: AC
  Administered 2015-06-01: 20 mg via INTRAVENOUS
  Filled 2015-06-01: qty 50

## 2015-06-01 MED ORDER — DIPHENHYDRAMINE HCL 50 MG/ML IJ SOLN
25.0000 mg | Freq: Once | INTRAMUSCULAR | Status: AC
  Administered 2015-06-01: 25 mg via INTRAVENOUS
  Filled 2015-06-01: qty 1

## 2015-06-01 NOTE — ED Notes (Signed)
Pt reporting swelling in lower lip and noticing welts on side.  Reports being taken off of lisinopril and started taking losartan on 2/14.

## 2015-06-01 NOTE — ED Provider Notes (Signed)
CSN: [id number]     Arrival date & time 06/01/15  2004 History  By signing my name below, I, Nicole Kindred, attest that this documentation has been prepared under the direction and in the presence of Delora Fuel, MD.   Electronically Signed: Nicole Kindred, ED Scribe. 06/01/2015. 9:57 PM    Chief Complaint  Patient presents with  . Allergic Reaction    The history is provided by the patient. No language interpreter was used.   HPI Comments: Ann Roberson is a 65 y.o. female with PMHx of DM, HTN, and sarcoidosis who presents to the Emergency Department complaining of sudden onset, constant allergic reaction, ongoing for about three or four weeks, worsening in the past few days. Pt reports associated swelling to her lower lip, swelling to face, and wheezing. Pt switched medication from lisinopril to losartan recently but has had similar reactions to both medications. No worsening or alleviating factors noted. Pt denies shortness of breath, fever, chills, nausea, abdominal pain, vomiting, or any other pertinent symptoms. Pt also states episodes of hives that have since alleviated.     Past Medical History  Diagnosis Date  . Diabetes mellitus   . Sarcoidosis ([place])   . Hypertension    Past Surgical History  Procedure Laterality Date  . Cesarean section    . Cholecystectomy     No family history on file. Social History  Substance Use Topics  . Smoking status: Never Smoker   . Smokeless tobacco: Never Used  . Alcohol Use: No   OB History    Gravida Para Term Preterm AB TAB SAB Ectopic Multiple Living   1 1 1       1      Review of Systems  Constitutional: Negative for fever and chills.  Respiratory: Positive for wheezing. Negative for shortness of breath.   Gastrointestinal: Negative for nausea, vomiting and abdominal pain.  All other systems reviewed and are negative.    Allergies  Erythromycin  Home Medications   Prior to Admission medications   Medication  Sig Start Date End Date Taking? Authorizing Provider  furosemide (LASIX) 20 MG tablet Take 20-40 mg by mouth daily as needed for fluid.    Yes Historical Provider, MD  gabapentin (NEURONTIN) 300 MG capsule Take 600 mg by mouth 3 (three) times daily.   Yes Historical Provider, MD  glipiZIDE (GLUCOTROL) 10 MG tablet Take 10 mg by mouth 2 (two) times daily.     Yes Historical Provider, MD  insulin glargine (LANTUS) 100 UNIT/ML injection Inject 42 Units into the skin at bedtime.    Yes Historical Provider, MD  lisinopril (PRINIVIL,ZESTRIL) 20 MG tablet TAKE ONE TABLET BY MOUTH TWICE DAILY 05/08/15  Yes Historical Provider, MD  losartan (COZAAR) 100 MG tablet Take 100 mg by mouth every evening.   Yes Historical Provider, MD  metFORMIN (GLUCOPHAGE) 1000 MG tablet Take 1,000 mg by mouth 2 (two) times daily.     Yes Historical Provider, MD  simvastatin (ZOCOR) 20 MG tablet Take 20 mg by mouth every evening.   Yes Historical Provider, MD   BP 171/75 mmHg  Pulse 89  Temp(Src) 97.5 F (36.4 C) (Oral)  Resp 18  Ht 5\' 4"  (1.626 m)  Wt 280 lb (127.007 kg)  BMI 48.04 kg/m2  SpO2 99% Physical Exam  Constitutional: She is oriented to person, place, and time. She appears well-developed and well-nourished. No distress.  HENT:  Head: Normocephalic and atraumatic.  Mild angioedema of lower lip. No lingual or  sublingual swelling. No swelling of the pharynx. No strider. No difficulty with secretions. Normal phonation.   Eyes: EOM are normal. Pupils are equal, round, and reactive to light.  Neck: Normal range of motion. Neck supple. No JVD present.  Cardiovascular: Normal rate, regular rhythm and normal heart sounds.   No murmur heard. Pulmonary/Chest: Effort normal and breath sounds normal. She has no wheezes. She has no rales. She exhibits no tenderness.  Abdominal: Soft. Bowel sounds are normal. She exhibits no distension and no mass. There is no tenderness.  Musculoskeletal: Normal range of motion. She  exhibits no edema.  Trace pitting edema bilaterally.   Lymphadenopathy:    She has no cervical adenopathy.  Neurological: She is alert and oriented to person, place, and time. No cranial nerve deficit. She exhibits normal muscle tone. Coordination normal.  Skin: Skin is warm and dry. No rash noted.  Psychiatric: She has a normal mood and affect. Her behavior is normal. Judgment and thought content normal.  Nursing note and vitals reviewed.   ED Course  Procedures (including critical care time) DIAGNOSTIC STUDIES: Oxygen Saturation is 99% on RA, normal by my interpretation.    COORDINATION OF CARE: 9:56 PM-Discussed treatment plan which includes POC CBC, benadryl and famotidine with pt at bedside and pt agreed to plan.    MDM   Final diagnoses:  Angioedema of lips, initial encounter   Angioedema of the lower lip. This is apparently secondary to losartan. She is diabetic, sister areas will be avoided. She is given a dose of diphenhydramine and famotidine and observed in the ED. There was no progression of the edema although it did not resolve either. Since there was no progression of swelling, she was felt to be safe for discharge. She is advised to stop taking losartan and never take an ACE inhibitor in the future. She is to contact her PCP for advice regarding antihypertensive medication to replace losartan. She is advised to take over-the-counter second-generation antihistamine and H2 blocker for the next 5 days. Return if symptoms worsen.   I personally performed the services described in this documentation, which was scribed in my presence. The recorded information has been reviewed and is accurate.       Delora Fuel, MD [phone]

## 2015-06-02 NOTE — Discharge Instructions (Signed)
Take loratadine (Claritin) or cetirizine (Zyrtec) once a day. Take ranitidine (Zantac) or famotidine (Pepcid AC) twice a day.  Stop taking losartan. Do not ever take any medication in the class of ACE inhibitors or ARB's.   Angioedema Angioedema is a sudden swelling of tissues, often of the skin. It can occur on the face or genitals or in the abdomen or other body parts. The swelling usually develops over a short period and gets better in 24 to 48 hours. It often begins during the night and is found when the person wakes up. The person may also get red, itchy patches of skin (hives). Angioedema can be dangerous if it involves swelling of the air passages.  Depending on the cause, episodes of angioedema may only happen once, come back in unpredictable patterns, or repeat for several years and then gradually fade away.  CAUSES  Angioedema can be caused by an allergic reaction to various triggers. It can also result from nonallergic causes, including reactions to drugs, immune system disorders, viral infections, or an abnormal gene that is passed to you from your parents (hereditary). For some people with angioedema, the cause is unknown.  Some things that can trigger angioedema include:   Foods.   Medicines, such as ACE inhibitors, ARBs, nonsteroidal anti-inflammatory agents, or estrogen.   Latex.   Animal saliva.   Insect stings.   Dyes used in X-rays.   Mild injury.   Dental work.  Surgery.  Stress.   Sudden changes in temperature.   Exercise. SIGNS AND SYMPTOMS   Swelling of the skin.  Hives. If these are present, there is also intense itching.  Redness in the affected area.   Pain in the affected area.  Swollen lips or tongue.  Breathing problems. This may happen if the air passages swell.  Wheezing. If internal organs are involved, there may be:   Nausea.   Abdominal pain.   Vomiting.   Difficulty swallowing.   Difficulty passing  urine. DIAGNOSIS   Your health care provider will examine the affected area and take a medical and family history.  Various tests may be done to help determine the cause. Tests may include:  Allergy skin tests to see if the problem is an allergic reaction.   Blood tests to check for hereditary angioedema.   Tests to check for underlying diseases that could cause the condition.   A review of your medicines, including over-the-counter medicines, may be done. TREATMENT  Treatment will depend on the cause of the angioedema. Possible treatments include:   Removal of anything that triggered the condition (such as stopping certain medicines).   Medicines to treat symptoms or prevent attacks. Medicines given may include:   Antihistamines.   Epinephrine injection.   Steroids.   Hospitalization may be required for severe attacks. If the air passages are affected, it can be an emergency. Tubes may need to be placed to keep the airway open. HOME CARE INSTRUCTIONS   Take all medicines as directed by your health care provider.  If you were given medicines for emergency allergy treatment, always carry them with you.  Wear a medical bracelet as directed by your health care provider.   Avoid known triggers. SEEK MEDICAL CARE IF:   You have repeat attacks of angioedema.   Your attacks are more frequent or more severe despite preventive measures.   You have hereditary angioedema and are considering having children. It is important to discuss with your health care provider the risks of passing  the condition on to your children. SEEK IMMEDIATE MEDICAL CARE IF:   You have severe swelling of the mouth, tongue, or lips.  You have difficulty breathing.   You have difficulty swallowing.   You faint. MAKE SURE YOU:  Understand these instructions.  Will watch your condition.  Will get help right away if you are not doing well or get worse.   This information is not  intended to replace advice given to you by your health care provider. Make sure you discuss any questions you have with your health care provider.   Document Released: 05/23/2001 Document Revised: 04/04/2014 Document Reviewed: 11/05/2012 Elsevier Interactive Patient Education [employer].

## 2015-11-17 ENCOUNTER — Ambulatory Visit (INDEPENDENT_AMBULATORY_CARE_PROVIDER_SITE_OTHER): Payer: BLUE CROSS/BLUE SHIELD | Admitting: Allergy

## 2015-11-17 ENCOUNTER — Encounter: Payer: Self-pay | Admitting: Allergy

## 2015-11-17 VITALS — BP 128/70 | HR 95 | Temp 98.8°F | Resp 20 | Ht 64.37 in | Wt 276.6 lb

## 2015-11-17 DIAGNOSIS — R011 Cardiac murmur, unspecified: Secondary | ICD-10-CM

## 2015-11-17 DIAGNOSIS — T783XXA Angioneurotic edema, initial encounter: Secondary | ICD-10-CM | POA: Diagnosis not present

## 2015-11-17 DIAGNOSIS — L509 Urticaria, unspecified: Secondary | ICD-10-CM | POA: Diagnosis not present

## 2015-11-17 MED ORDER — RANITIDINE HCL 150 MG PO TABS: 150.0000 mg | ORAL_TABLET | Freq: Two times a day (BID) | ORAL | 5 refills | Status: DC

## 2015-11-17 MED ORDER — MONTELUKAST SODIUM 10 MG PO TABS: 10.0000 mg | ORAL_TABLET | Freq: Every day | ORAL | 5 refills | Status: DC

## 2015-11-17 NOTE — Progress Notes (Signed)
New Patient Note  RE: Ann Roberson MRN: [id number] DOB: [birth year] Date of Office Visit: 11/17/2015  Referring provider: Rosita Fire, MD Primary care provider: Rosita Fire, MD  Chief Complaint: hives  History of present illness: Ann Roberson is a 65 y.o. female presenting today for consultation for hives.  She has a history of sarcoidosis followed by her PCP Dr. Legrand Rams.   Hives started about 6 mo ago.  She has continued to have hives and swelling several times a week.  She has had lip swelling on several occasions with most recently being last night.  She did go to the ED in March with the first episode of lip swelling and received "IV medications".   Reviewed ED note from 06/01/15 that did note lip swelling on exam and she was treated with IV benadryl and pepcid.   She denies any difficulty breathing, no trouble swallowing with lip swelling.  Hives are large welts and are itchy. Hives last <24hr, leave no marks or bruising, no joint pain/arthralgias, no fevers.  Lip swelling also last <24hr.   She takes benadryl when she has the hives and it does help improve/resolve the hives.  She also has hydroxyzine that she takes as needed as well.    Hives not worse with pressure or extremes of temperature.  No preceding illness.  No medication or dosage change. No change in soaps/lotions/detegents. No stings. No new foods.  She does not take any NSAIDs due to bleeding tendency and no ETOH ingestion.   She has not been able to identify a trigger.   Her PCP did stop her Lisinopril and changes to Losartan which was stopped as she continued to have lip swelling episodes.   She was also stopped lasix and glipizide but did not note any improvement in hives off of these medications.   Has sarcoidoisis and which she reports is stable and does not currently take any medications for this.    No nasal or ocular symptoms suggestive of allergic rhinits.  No history of asthma or eczema.    Review of  systems: Review of Systems  Constitutional: Negative for chills and fever.  HENT: Negative for congestion and sore throat.   Eyes: Negative for redness.  Respiratory: Negative for cough and shortness of breath.   Cardiovascular: Negative for palpitations.  Gastrointestinal: Negative for nausea and vomiting.  Neurological: Negative for headaches.    All other systems negative unless noted above in HPI  Past medical history: Past Medical History:  Diagnosis Date  . Diabetes mellitus   . Hypertension   . Sarcoidosis [facility])     Past surgical history: Past Surgical History:  Procedure Laterality Date  . CESAREAN SECTION    . CHOLECYSTECTOMY      Family history:  Family History  Problem Relation Age of Onset  . Asthma Brother   . Allergic rhinitis Neg Hx   . Angioedema Neg Hx   . Atopy Neg Hx   . Eczema Neg Hx   . Immunodeficiency Neg Hx   . Urticaria Neg Hx     Social history: Social History   Social History  . Marital status: Single    Spouse name: N/A  . Number of children: N/A  . Years of education: N/A   Occupational History  . Not on file.   Social History Main Topics  . Smoking status: Never Smoker  . Smokeless tobacco: Never Used  . Alcohol use No  . Drug use: No  .  Sexual activity: Yes    Birth control/ protection: Post-menopausal   Other Topics Concern  . Not on file   Social History Narrative  . No narrative on file    Medication List:   Medication List       Accurate as of 11/17/15  3:12 PM. Always use your most recent med list.          amLODipine 10 MG tablet Commonly known as:  NORVASC TK 1 T PO QD   ARTHRITIS PAIN RELIEVER 650 MG CR tablet Generic drug:  acetaminophen Take 1,300 mg by mouth every 8 (eight) hours as needed for pain.   FARXIGA 5 MG Tabs tablet Generic drug:  dapagliflozin propanediol TK 1 T PO  QD   furosemide 20 MG tablet Commonly known as:  LASIX Take 20-40 mg by mouth daily as needed for fluid.     gabapentin 300 MG capsule Commonly known as:  NEURONTIN Take 600 mg by mouth 3 (three) times daily.   glipiZIDE 10 MG tablet Commonly known as:  GLUCOTROL Take 10 mg by mouth 2 (two) times daily.   hydrOXYzine 25 MG tablet Commonly known as:  ATARAX/VISTARIL Take 25 mg by mouth 3 (three) times daily.   insulin glargine 100 UNIT/ML injection Commonly known as:  LANTUS Inject 42 Units into the skin at bedtime.   metFORMIN 1000 MG tablet Commonly known as:  GLUCOPHAGE Take 1,000 mg by mouth 2 (two) times daily.   montelukast 10 MG tablet Commonly known as:  SINGULAIR Take 1 tablet (10 mg total) by mouth at bedtime.   ranitidine 150 MG tablet Commonly known as:  ZANTAC Take 1 tablet (150 mg total) by mouth 2 (two) times daily.   simvastatin 20 MG tablet Commonly known as:  ZOCOR Take 20 mg by mouth every evening.       Known medication allergies: Allergies  Allergen Reactions  . Erythromycin Nausea And Vomiting     Physical examination: Blood pressure 128/70, pulse 95, temperature 98.8 F (37.1 C), temperature source Oral, resp. rate 20, height 5' 4.37" (1.635 m), weight 276 lb 9.6 oz (125.5 kg), SpO2 96 %.  General: Alert, interactive, in no acute distress. HEENT: TMs pearly gray, turbinates non-edematous without discharge, post-pharynx non erythematous. Neck: Supple without lymphadenopathy. Lungs: Clear to auscultation without wheezing, rhonchi or rales. {no increased work of breathing. CV: Normal S1, S2 with grade 2 systolic murmur. Abdomen: Nondistended, nontender. Skin: Warm and dry, without lesions or rashes.  No urticarial lesions today of swelling Extremities:  No clubbing, cyanosis or edema. Neuro:   Grossly intact.  Diagnositics/Labs: None today  Assessment and plan: Urticaria with angioedema , chronic   - unknown trigger at this time; likely autoimmune component given history of sarcoidoisis.  Autoimmune disease may predispose to development of  autoimmune urticaria.     - will obtain hive panel, CBC, CMP, TSH, tryptase today   - Start Allegra 180 mg and Zantac 150mg  twice a day   - start Singulair 10mg  at bedtime   - reserve benadryl and hydroxyzine for breakthrough hives and swelling   - if she fails above medications consider starting Xolair for treatment   - advise she avoid ACE-I and ARBs in the future  Murmur Advised to follow-up with PCP No Cp, SOB or other concerning symptoms  Follow-up 2-3 months  I appreciate the opportunity to take part in Ann Roberson's care. Please do not hesitate to contact me with questions.  Sincerely,   Prudy Feeler, MD  Allergy/Immunology [facility]

## 2015-11-17 NOTE — Patient Instructions (Addendum)
Urticaria (hives) and angioedema (swelling)    - unknown trigger at this time   - will obtain hive panel, CBC, CMP, TSH, tryptase today   - Start Allegra 180 mg and Zantac 150mg  twice a day   - start Singulair 10mg  at bedtime   - reserve benadryl and hydroxyzine for breakthrough hives and swelling  Follow-up 2-3 months

## 2015-11-18 LAB — COMPREHENSIVE METABOLIC PANEL
ALT: 12 U/L (ref 6–29)
AST: 10 U/L (ref 10–35)
Albumin: 3.8 g/dL (ref 3.6–5.1)
Alkaline Phosphatase: 67 U/L (ref 33–130)
BUN: 17 mg/dL (ref 7–25)
CO2: 20 mmol/L (ref 20–31)
Calcium: 8.9 mg/dL (ref 8.6–10.4)
Chloride: 104 mmol/L (ref 98–110)
Creat: 1.12 mg/dL — ABNORMAL HIGH (ref 0.50–0.99)
Glucose, Bld: 234 mg/dL — ABNORMAL HIGH (ref 65–99)
Potassium: 4.1 mmol/L (ref 3.5–5.3)
Sodium: 138 mmol/L (ref 135–146)
Total Bilirubin: 0.4 mg/dL (ref 0.2–1.2)
Total Protein: 6.7 g/dL (ref 6.1–8.1)

## 2015-11-18 LAB — CBC WITH DIFFERENTIAL/PLATELET
Basophils Absolute: 0 cells/uL (ref 0–200)
Basophils Relative: 0 %
Eosinophils Absolute: 120 cells/uL (ref 15–500)
Eosinophils Relative: 2 %
HCT: 36.2 % (ref 35.0–45.0)
Hemoglobin: 11.2 g/dL — ABNORMAL LOW (ref 11.7–15.5)
Lymphocytes Relative: 26 %
Lymphs Abs: 1560 cells/uL (ref 850–3900)
MCH: 27.1 pg (ref 27.0–33.0)
MCHC: 30.9 g/dL — ABNORMAL LOW (ref 32.0–36.0)
MCV: 87.4 fL (ref 80.0–100.0)
MPV: 8.9 fL (ref 7.5–12.5)
Monocytes Absolute: 540 cells/uL (ref 200–950)
Monocytes Relative: 9 %
Neutro Abs: 3780 cells/uL (ref 1500–7800)
Neutrophils Relative %: 63 %
Platelets: 285 10*3/uL (ref 140–400)
RBC: 4.14 MIL/uL (ref 3.80–5.10)
RDW: 14.4 % (ref 11.0–15.0)
WBC: 6 10*3/uL (ref 3.8–10.8)

## 2015-11-20 LAB — TRYPTASE: Tryptase: 9.7 ug/L (ref <11)

## 2015-11-30 LAB — CP CHRONIC URTICARIA INDEX PANEL
Histamine Release: <16 % (ref <16)
TSH: 1.22 mIU/L
Thyroglobulin Ab: 1 IU/mL (ref <2)
Thyroperoxidase Ab SerPl-aCnc: 3 IU/mL (ref <9)

## 2015-12-13 ENCOUNTER — Encounter (HOSPITAL_COMMUNITY): Payer: Self-pay | Admitting: *Deleted

## 2015-12-13 ENCOUNTER — Emergency Department (HOSPITAL_COMMUNITY)
Admission: EM | Admit: 2015-12-13 | Discharge: 2015-12-13 | Disposition: A | Discharge status: Home / Self Care | Payer: BLUE CROSS/BLUE SHIELD | Attending: Emergency Medicine | Admitting: Emergency Medicine

## 2015-12-13 DIAGNOSIS — M436 Torticollis: Secondary | ICD-10-CM | POA: Diagnosis not present

## 2015-12-13 DIAGNOSIS — Z794 Long term (current) use of insulin: Secondary | ICD-10-CM | POA: Insufficient documentation

## 2015-12-13 DIAGNOSIS — E119 Type 2 diabetes mellitus without complications: Secondary | ICD-10-CM | POA: Diagnosis not present

## 2015-12-13 DIAGNOSIS — I10 Essential (primary) hypertension: Secondary | ICD-10-CM | POA: Insufficient documentation

## 2015-12-13 DIAGNOSIS — Z79899 Other long term (current) drug therapy: Secondary | ICD-10-CM | POA: Diagnosis not present

## 2015-12-13 DIAGNOSIS — Z7984 Long term (current) use of oral hypoglycemic drugs: Secondary | ICD-10-CM | POA: Insufficient documentation

## 2015-12-13 DIAGNOSIS — M542 Cervicalgia: Secondary | ICD-10-CM | POA: Diagnosis present

## 2015-12-13 MED ORDER — ONDANSETRON 4 MG PO TBDP
4.0000 mg | ORAL_TABLET | Freq: Once | ORAL | Status: AC
  Administered 2015-12-13: 4 mg via ORAL
  Filled 2015-12-13: qty 1

## 2015-12-13 MED ORDER — OXYCODONE-ACETAMINOPHEN 5-325 MG PO TABS
2.0000 | ORAL_TABLET | Freq: Once | ORAL | Status: AC
  Administered 2015-12-13: 2 via ORAL
  Filled 2015-12-13: qty 2

## 2015-12-13 MED ORDER — HYDROCODONE-ACETAMINOPHEN 5-325 MG PO TABS: 1.0000 | ORAL_TABLET | Freq: Once | ORAL | Status: DC

## 2015-12-13 MED ORDER — NAPROXEN 375 MG PO TABS: 375.0000 mg | ORAL_TABLET | Freq: Two times a day (BID) | ORAL | 0 refills | Status: DC

## 2015-12-13 MED ORDER — OXYCODONE-ACETAMINOPHEN 5-325 MG PO TABS: 2.0000 | ORAL_TABLET | ORAL | 0 refills | Status: DC | PRN

## 2015-12-13 MED ORDER — LIDOCAINE 5 % EX PTCH: 1.0000 | MEDICATED_PATCH | CUTANEOUS | 0 refills | Status: DC

## 2015-12-13 MED ORDER — ONDANSETRON 4 MG PO TBDP: 4.0000 mg | ORAL_TABLET | Freq: Once | ORAL | Status: DC

## 2015-12-13 NOTE — ED Notes (Signed)
Patient given discharge instructions and prepack of six Oxycodone tablets

## 2015-12-13 NOTE — ED Provider Notes (Signed)
[place] DEPT Provider Note   CSN: [id number] Arrival date & time: 12/13/15  2145     History   Chief Complaint Chief Complaint  Patient presents with  . Neck Pain    HPI Ann Roberson is a 65 y.o. female    Ann Roberson is a 65 y.o. female who complains of neck pain  For 1 day(s).The pain is positional with movement of neck without radiation of pain down the arms. Patient states she awoke with pain in her left neck and states that it really hurts to move or turn the neck.  Symptoms have been constant since that time. Prior history of neck problems: no prior neck problems. There is no numbness, tingling, weakness in the arms.    HPI  Past Medical History:  Diagnosis Date  . Diabetes mellitus   . Hypertension   . Sarcoidosis [facility])     Patient Active Problem List   Diagnosis Date Noted  . Urticaria 11/17/2015  . Angioedema 11/17/2015  . Pain in joint, lower leg 12/10/2013    Past Surgical History:  Procedure Laterality Date  . CESAREAN SECTION    . CHOLECYSTECTOMY      OB History    Gravida Para Term Preterm AB Living   1 1 1     1    SAB TAB Ectopic Multiple Live Births                   Home Medications    Prior to Admission medications   Medication Sig Start Date End Date Taking? Authorizing Provider  acetaminophen (ARTHRITIS PAIN RELIEVER) 650 MG CR tablet Take 1,300 mg by mouth every 8 (eight) hours as needed for pain.    Historical Provider, MD  amLODipine (NORVASC) 10 MG tablet TK 1 T PO QD 10/28/15   Historical Provider, MD  FARXIGA 5 MG TABS tablet TK 1 T PO  QD 09/11/15   Historical Provider, MD  furosemide (LASIX) 20 MG tablet Take 20-40 mg by mouth daily as needed for fluid.     Historical Provider, MD  gabapentin (NEURONTIN) 300 MG capsule Take 600 mg by mouth 3 (three) times daily.    Historical Provider, MD  glipiZIDE (GLUCOTROL) 10 MG tablet Take 10 mg by mouth 2 (two) times daily.      Historical Provider, MD  hydrOXYzine  (ATARAX/VISTARIL) 25 MG tablet Take 25 mg by mouth 3 (three) times daily.    Historical Provider, MD  insulin glargine (LANTUS) 100 UNIT/ML injection Inject 42 Units into the skin at bedtime.     Historical Provider, MD  metFORMIN (GLUCOPHAGE) 1000 MG tablet Take 1,000 mg by mouth 2 (two) times daily.      Historical Provider, MD  montelukast (SINGULAIR) 10 MG tablet Take 1 tablet (10 mg total) by mouth at bedtime. 11/17/15   Ann Charmian Muff, MD  ranitidine (ZANTAC) 150 MG tablet Take 1 tablet (150 mg total) by mouth 2 (two) times daily. 11/17/15   Ann Charmian Muff, MD  simvastatin (ZOCOR) 20 MG tablet Take 20 mg by mouth every evening.    Historical Provider, MD    Family History Family History  Problem Relation Age of Onset  . Asthma Brother   . Allergic rhinitis Neg Hx   . Angioedema Neg Hx   . Atopy Neg Hx   . Eczema Neg Hx   . Immunodeficiency Neg Hx   . Urticaria Neg Hx     Social History Social History  Substance  Use Topics  . Smoking status: Never Smoker  . Smokeless tobacco: Never Used  . Alcohol use No     Allergies   Erythromycin   Review of Systems Review of Systems  Constitutional: Negative for fever.  Eyes: Negative for photophobia.  Musculoskeletal: Positive for neck pain. Negative for neck stiffness.  Skin: Negative for rash.  Neurological: Negative for weakness, numbness and headaches.     Physical Exam Updated Vital Signs BP 141/62 (BP Location: Right Arm)   Pulse 102   Temp 98.3 F (36.8 C) (Oral)   Resp 18   Ht 5\' 4"  (1.626 m)   Wt 118.8 kg   SpO2 100%   BMI 44.97 kg/m   Physical Exam  Constitutional: She is oriented to person, place, and time. She appears well-developed and well-nourished. No distress.  HENT:  Head: Normocephalic and atraumatic.  Eyes: Conjunctivae are normal. No scleral icterus.  Neck: Normal range of motion.  Cardiovascular: Normal rate, regular rhythm and normal heart sounds.  Exam reveals no gallop  and no friction rub.   No murmur heard. Pulmonary/Chest: Effort normal and breath sounds normal. No respiratory distress.  Abdominal: Soft. Bowel sounds are normal. She exhibits no distension and no mass. There is no tenderness. There is no guarding.  Musculoskeletal:  Neck exam: normal neurological exam of arms; normal DTR's, motor, sensory exam, TTP left levator scapula, Reduced ROM with  Right flexion and left rotation.       Neurological: She is alert and oriented to person, place, and time.  Skin: Skin is warm and dry. She is not diaphoretic.     ED Treatments / Results  Labs (all labs ordered are listed, but only abnormal results are displayed) Labs Reviewed - No data to display  EKG  EKG Interpretation None       Radiology No results found.  Procedures Procedures (including critical care time)  Medications Ordered in ED Medications  oxyCODONE-acetaminophen (PERCOCET/ROXICET) 5-325 MG per tablet 2 tablet (2 tablets Oral Given 12/13/15 2202)  ondansetron (ZOFRAN-ODT) disintegrating tablet 4 mg (4 mg Oral Given 12/13/15 2202)     Initial Impression / Assessment and Plan / ED Course  I have reviewed the triage vital signs and the nursing notes.  Pertinent labs & imaging results that were available during my care of the patient were reviewed by me and considered in my medical decision making (see chart for details).  Clinical Course    Patient with acute torticollis. No concern for infection. Treat symptomatically. NECK ROM and supportive therapy discussed  Discussed return precauations.  Final Clinical Impressions(s) / ED Diagnoses   Final diagnoses:  Torticollis, acute    New Prescriptions New Prescriptions   No medications on file     Margarita Mail, PA-C 12/14/15 0024    Veryl Speak, MD 12/15/15 [phone]

## 2015-12-13 NOTE — ED Triage Notes (Signed)
Pt reports waking up with a crick in the left side of her neck.

## 2015-12-15 MED FILL — Oxycodone w/ Acetaminophen Tab 5-325 MG: ORAL | Qty: 6 | Status: AC

## 2016-02-25 ENCOUNTER — Emergency Department (HOSPITAL_COMMUNITY)
Admission: EM | Admit: 2016-02-25 | Discharge: 2016-02-25 | Disposition: A | Discharge status: Home / Self Care | Payer: BLUE CROSS/BLUE SHIELD | Attending: Emergency Medicine | Admitting: Emergency Medicine

## 2016-02-25 ENCOUNTER — Encounter (HOSPITAL_COMMUNITY): Payer: Self-pay | Admitting: Emergency Medicine

## 2016-02-25 ENCOUNTER — Emergency Department (HOSPITAL_COMMUNITY): Payer: BLUE CROSS/BLUE SHIELD

## 2016-02-25 DIAGNOSIS — E119 Type 2 diabetes mellitus without complications: Secondary | ICD-10-CM | POA: Diagnosis not present

## 2016-02-25 DIAGNOSIS — Z794 Long term (current) use of insulin: Secondary | ICD-10-CM | POA: Diagnosis not present

## 2016-02-25 DIAGNOSIS — Z79899 Other long term (current) drug therapy: Secondary | ICD-10-CM | POA: Insufficient documentation

## 2016-02-25 DIAGNOSIS — S20212A Contusion of left front wall of thorax, initial encounter: Secondary | ICD-10-CM | POA: Diagnosis not present

## 2016-02-25 DIAGNOSIS — Y929 Unspecified place or not applicable: Secondary | ICD-10-CM | POA: Diagnosis not present

## 2016-02-25 DIAGNOSIS — Y9389 Activity, other specified: Secondary | ICD-10-CM | POA: Insufficient documentation

## 2016-02-25 DIAGNOSIS — W01190A Fall on same level from slipping, tripping and stumbling with subsequent striking against furniture, initial encounter: Secondary | ICD-10-CM | POA: Diagnosis not present

## 2016-02-25 DIAGNOSIS — I10 Essential (primary) hypertension: Secondary | ICD-10-CM | POA: Insufficient documentation

## 2016-02-25 DIAGNOSIS — S299XXA Unspecified injury of thorax, initial encounter: Secondary | ICD-10-CM | POA: Diagnosis present

## 2016-02-25 DIAGNOSIS — Y999 Unspecified external cause status: Secondary | ICD-10-CM | POA: Insufficient documentation

## 2016-02-25 MED ORDER — ACETAMINOPHEN 325 MG PO TABS
650.0000 mg | ORAL_TABLET | Freq: Once | ORAL | Status: AC
  Administered 2016-02-25: 650 mg via ORAL
  Filled 2016-02-25: qty 2

## 2016-02-25 MED ORDER — IBUPROFEN 800 MG PO TABS
800.0000 mg | ORAL_TABLET | Freq: Once | ORAL | Status: AC
  Administered 2016-02-25: 800 mg via ORAL
  Filled 2016-02-25: qty 1

## 2016-02-25 MED ORDER — HYDROCODONE-ACETAMINOPHEN 5-325 MG PO TABS: 1.0000 | ORAL_TABLET | ORAL | 0 refills | Status: DC | PRN

## 2016-02-25 NOTE — Discharge Instructions (Addendum)
Your vital signs within normal limits. The oxygen level is 98% on room air. The x-ray of your chest is negative for any lung related problem. The rib films are also negative for fracture or dislocation. Please use an ice pack tonight and tomorrow, after that you may use heat or ice for comfort. Please use Tylenol and/or ibuprofen for mild soreness. Use Norco for more severe soreness.This medication may cause drowsiness. Please do not drink, drive, or participate in activity that requires concentration while taking this medication. Please see Dr. Legrand Rams for additional evaluation and for any additional pain management if needed.

## 2016-02-25 NOTE — ED Notes (Signed)
Pt in gown, PA assessing chest area, pt complaining pain is worse deep breath or cough

## 2016-02-25 NOTE — ED Triage Notes (Signed)
PT stated she slipped and fell and landed onto her bed post with her left breast sticking the bed post. PT c/o left breast pain on palpation.

## 2016-02-25 NOTE — ED Provider Notes (Signed)
[place] DEPT Provider Note   CSN: [id number] Arrival date & time: 02/25/16  1743     History   Chief Complaint Chief Complaint  Patient presents with  . Fall    HPI Ann Roberson is a 65 y.o. female.  Patient is a 65 year old female who presents to the emergency department with complaint of left chest wall pain.  The patient states that approximately 8:30 this morning she was reaching to turn the television off when she fell and hit her chest on a bed board. She states that she had severe pain. She states that it knocked the breath out of her for few seconds. She now has pain with movement, and she has pain with taking a deep breath. The patient denies being on any anticoagulation medications. She's not had any previous operations or procedures involving her chest. She has tried rest, but this is not helping. Movement makes the pain significantly worse.   The history is provided by the patient.    Past Medical History:  Diagnosis Date  . Diabetes mellitus   . Hypertension   . Sarcoidosis [facility])     Patient Active Problem List   Diagnosis Date Noted  . Urticaria 11/17/2015  . Angioedema 11/17/2015  . Pain in joint, lower leg 12/10/2013    Past Surgical History:  Procedure Laterality Date  . CESAREAN SECTION    . CHOLECYSTECTOMY      OB History    Gravida Para Term Preterm AB Living   1 1 1     1    SAB TAB Ectopic Multiple Live Births                   Home Medications    Prior to Admission medications   Medication Sig Start Date End Date Taking? Authorizing Provider  amLODipine (NORVASC) 10 MG tablet TK 1 T PO QD 10/28/15   Historical Provider, MD  FARXIGA 5 MG TABS tablet TK 1 T PO  QD 09/11/15   Historical Provider, MD  furosemide (LASIX) 20 MG tablet Take 20-40 mg by mouth daily as needed for fluid.     Historical Provider, MD  gabapentin (NEURONTIN) 300 MG capsule Take 600 mg by mouth 3 (three) times daily.    Historical Provider, MD    glipiZIDE (GLUCOTROL) 10 MG tablet Take 10 mg by mouth 2 (two) times daily.      Historical Provider, MD  hydrOXYzine (ATARAX/VISTARIL) 25 MG tablet Take 25 mg by mouth 3 (three) times daily.    Historical Provider, MD  insulin glargine (LANTUS) 100 UNIT/ML injection Inject 42 Units into the skin at bedtime.     Historical Provider, MD  lidocaine (LIDODERM) 5 % Place 1 patch onto the skin daily. Remove & Discard patch within 12 hours or as directed by MD 12/13/15   Margarita Mail, PA-C  metFORMIN (GLUCOPHAGE) 1000 MG tablet Take 1,000 mg by mouth 2 (two) times daily.      Historical Provider, MD  montelukast (SINGULAIR) 10 MG tablet Take 1 tablet (10 mg total) by mouth at bedtime. 11/17/15   Shaylar Charmian Muff, MD  naproxen (NAPROSYN) 375 MG tablet Take 1 tablet (375 mg total) by mouth 2 (two) times daily. 12/13/15   Margarita Mail, PA-C  oxyCODONE-acetaminophen (PERCOCET/ROXICET) 5-325 MG tablet Take 2 tablets by mouth every 4 (four) hours as needed for severe pain. 12/13/15   Margarita Mail, PA-C  ranitidine (ZANTAC) 150 MG tablet Take 1 tablet (150 mg total) by mouth 2 (  two) times daily. 11/17/15   Shaylar Charmian Muff, MD  simvastatin (ZOCOR) 20 MG tablet Take 20 mg by mouth every evening.    Historical Provider, MD    Family History Family History  Problem Relation Age of Onset  . Asthma Brother   . Allergic rhinitis Neg Hx   . Angioedema Neg Hx   . Atopy Neg Hx   . Eczema Neg Hx   . Immunodeficiency Neg Hx   . Urticaria Neg Hx     Social History Social History  Substance Use Topics  . Smoking status: Never Smoker  . Smokeless tobacco: Never Used  . Alcohol use No     Allergies   Erythromycin   Review of Systems Review of Systems  Constitutional: Negative for activity change.       All ROS Neg except as noted in HPI  HENT: Negative for nosebleeds.   Eyes: Negative for photophobia and discharge.  Respiratory: Negative for cough, shortness of breath and wheezing.    Cardiovascular: Negative for chest pain and palpitations.  Gastrointestinal: Negative for abdominal pain and blood in stool.  Genitourinary: Negative for dysuria, frequency and hematuria.  Musculoskeletal: Positive for arthralgias. Negative for back pain and neck pain.  Skin: Negative.   Neurological: Negative for dizziness, seizures and speech difficulty.  Psychiatric/Behavioral: Negative for confusion and hallucinations.     Physical Exam Updated Vital Signs BP 136/86 (BP Location: Left Arm)   Pulse 99   Temp 97.9 F (36.6 C) (Oral)   Resp 20   Ht 5\' 5"  (1.651 m)   Wt 122.5 kg   SpO2 98%   BMI 44.93 kg/m   Physical Exam  Constitutional: She is oriented to person, place, and time. She appears well-developed and well-nourished.  Non-toxic appearance.  HENT:  Head: Normocephalic.  Right Ear: Tympanic membrane and external ear normal.  Left Ear: Tympanic membrane and external ear normal.  Eyes: EOM and lids are normal. Pupils are equal, round, and reactive to light.  Neck: Normal range of motion. Neck supple. Carotid bruit is not present.  Cardiovascular: Normal rate, regular rhythm, normal heart sounds, intact distal pulses and normal pulses.   Pulmonary/Chest: Breath sounds normal. No respiratory distress.    Chaperone present during the examination. There is tenderness to palpation at the tail of the left breast extending into the upper rib area. There is also an area of tenderness just above the areola of the left breast. There is no drainage from the nipple area. There is symmetrical rise and fall of the chest. The patient speaks in complete sentences without problem.  Pain is easily reproduced with palpation or with change of position.  Abdominal: Soft. Bowel sounds are normal. There is no tenderness. There is no guarding.  Musculoskeletal: Normal range of motion.  Lymphadenopathy:       Head (right side): No submandibular adenopathy present.       Head (left side):  No submandibular adenopathy present.    She has no cervical adenopathy.  Neurological: She is alert and oriented to person, place, and time. She has normal strength. No cranial nerve deficit or sensory deficit.  Skin: Skin is warm and dry.  Psychiatric: She has a normal mood and affect. Her speech is normal.  Nursing note and vitals reviewed.    ED Treatments / Results  Labs (all labs ordered are listed, but only abnormal results are displayed) Labs Reviewed - No data to display  EKG  EKG Interpretation None  Radiology No results found.  Procedures Procedures (including critical care time)  Medications Ordered in ED Medications  ibuprofen (ADVIL,MOTRIN) tablet 800 mg (not administered)  acetaminophen (TYLENOL) tablet 650 mg (not administered)     Initial Impression / Assessment and Plan / ED Course  I have reviewed the triage vital signs and the nursing notes.  Pertinent labs & imaging results that were available during my care of the patient were reviewed by me and considered in my medical decision making (see chart for details).  Clinical Course     **I have reviewed nursing notes, vital signs, and all appropriate lab and imaging results for this patient.*  Final Clinical Impressions(s) / ED Diagnoses  vital signs within normal limits. oxygen level is 98% on room air. Within normal limits by my interpretation. The x-ray of chest and ribs are negative for fracture or injury or dislocation. I suspect a contusion to chest wall. tylenol every 4 hours, or ibuprofen every 6 hours around-the-clock over the next couple of days. Will order 12 tabs of Norco for more severe pain.   Final diagnoses:  Contusion, chest wall, left, initial encounter    New Prescriptions New Prescriptions   No medications on file     Lily Kocher, Hershal Coria 02/25/16 2007    Virgel Manifold, MD 03/01/16 1014

## 2016-05-16 ENCOUNTER — Other Ambulatory Visit: Payer: Self-pay | Admitting: Allergy

## 2016-05-16 DIAGNOSIS — L509 Urticaria, unspecified: Secondary | ICD-10-CM

## 2016-05-16 DIAGNOSIS — T783XXA Angioneurotic edema, initial encounter: Secondary | ICD-10-CM

## 2016-06-13 ENCOUNTER — Other Ambulatory Visit: Payer: Self-pay | Admitting: Allergy and Immunology

## 2016-06-13 DIAGNOSIS — T783XXA Angioneurotic edema, initial encounter: Secondary | ICD-10-CM

## 2016-06-13 DIAGNOSIS — L509 Urticaria, unspecified: Secondary | ICD-10-CM

## 2016-06-18 ENCOUNTER — Other Ambulatory Visit: Payer: Self-pay | Admitting: Physician Assistant

## 2016-07-12 ENCOUNTER — Other Ambulatory Visit (HOSPITAL_COMMUNITY): Payer: Self-pay | Admitting: Internal Medicine

## 2016-07-12 DIAGNOSIS — Z1231 Encounter for screening mammogram for malignant neoplasm of breast: Secondary | ICD-10-CM

## 2016-07-18 ENCOUNTER — Ambulatory Visit (HOSPITAL_COMMUNITY)
Admission: RE | Admit: 2016-07-18 | Discharge: 2016-07-18 | Disposition: A | Discharge status: Home / Self Care | Payer: BLUE CROSS/BLUE SHIELD | Source: Ambulatory Visit | Attending: Internal Medicine | Admitting: Internal Medicine

## 2016-07-18 DIAGNOSIS — Z1231 Encounter for screening mammogram for malignant neoplasm of breast: Secondary | ICD-10-CM

## 2016-08-17 ENCOUNTER — Encounter (INDEPENDENT_AMBULATORY_CARE_PROVIDER_SITE_OTHER): Payer: Self-pay | Admitting: Orthopaedic Surgery

## 2016-08-17 ENCOUNTER — Ambulatory Visit (INDEPENDENT_AMBULATORY_CARE_PROVIDER_SITE_OTHER): Payer: BLUE CROSS/BLUE SHIELD | Admitting: Orthopaedic Surgery

## 2016-08-17 ENCOUNTER — Ambulatory Visit (INDEPENDENT_AMBULATORY_CARE_PROVIDER_SITE_OTHER): Payer: BLUE CROSS/BLUE SHIELD

## 2016-08-17 ENCOUNTER — Ambulatory Visit (INDEPENDENT_AMBULATORY_CARE_PROVIDER_SITE_OTHER): Payer: Self-pay

## 2016-08-17 VITALS — BP 123/67 | HR 93 | Resp 14 | Ht 65.0 in | Wt 258.0 lb

## 2016-08-17 DIAGNOSIS — M25561 Pain in right knee: Secondary | ICD-10-CM

## 2016-08-17 DIAGNOSIS — M25551 Pain in right hip: Secondary | ICD-10-CM

## 2016-08-17 DIAGNOSIS — G8929 Other chronic pain: Secondary | ICD-10-CM | POA: Diagnosis not present

## 2016-08-17 DIAGNOSIS — R269 Unspecified abnormalities of gait and mobility: Secondary | ICD-10-CM

## 2016-08-17 NOTE — Progress Notes (Signed)
Office Visit Note   Patient: Ann Roberson           Date of Birth: 08-19-1950           MRN: [id number] Visit Date: 08/17/2016              Requested by: Rosita Fire, MD [address] [place], [place] [address] PCP: Rosita Fire, MD   Assessment & Plan: Visit Diagnoses:  1. Abnormal gait   2. Chronic pain of right knee   3. Pain of right hip joint   End-stage osteoarthritis right knee. Morbid obesity  Plan: Ann Roberson has end-stage osteoarthritis in all 3 compartments of her right knee. We had a long discussion regarding the problem with the knee in a different treatment options. Unfortunately she can't take NSAIDs because of her diabetes and potential renal implications. She is concerned about cortisone because of her diabetes. She's tried bracing but they simply "rundown my leg". In my opinion her present weight would include her from having knee replacement. So we've had a long discussion regarding weight loss and nonweightbearing exercise. I do not think Visco supplementation would be of any help. Office 2-3 months   Orders:  Orders Placed This Encounter  Procedures  . XR KNEE 3 VIEW RIGHT  . XR Pelvis 1-2 Views   No orders of the defined types were placed in this encounter.     Procedures: No procedures performed   Clinical Data: No additional findings.   Subjective: Chief Complaint  Patient presents with  . Right Knee - Pain  . Left Knee - Pain  Ann Roberson relates that she's had a problem with her right knee for at least 9 years "off and on". She apparently fell about 9 years ago at work with onset of knee pain. She's had increased pain over the last several months to the point where she does use a cane. She takes Tylenol arthritis strength as she is limited in ability to take NSAIDs related to her diabetes and her kidneys. She has tried a brace but they simply "rolled down my leg". In addition she's tried ice and heat, [place], icy hot. She  oftentimes feels popping and stinging in her right knee and loses "balance". She does work as a Pharmacologist  HPI  Review of Systems   Objective: Vital Signs: BP 123/67   Pulse 93   Resp 14   Ht 5\' 5"  (1.651 m)   Wt 258 lb (117 kg)   BMI 42.93 kg/m   Physical Exam  Ortho Exam BMI 43. Ann Roberson walks with a cane favoring the right lower extremity. Very large thigh and leg with moderate bilateral ankle edema. Some altered sensibility in both of her feet related to diabetic neuropathy. Skin intact. Right knee extends fully with flexion at 90 at which point calf touches thigh more medial lateral joint pain. Positive patellar crepitation. Knee 2 larger determine if there is an effusion. Knee was not hot red warm or swollen. Painless range of motion of both hips. Straight leg raise negative.  Specialty Comments:  No specialty comments available.  Imaging: Xr Knee 3 View Right  Result Date: 08/17/2016 Films of the right knee were obtained in 3 projections standing. There is complete collapse of the medial compartment with approximately 3 of varus. Peripheral osteophytes are identified medially and laterally and subchondral sclerosis identified medial tibia and femur. Possible loose body in the lateral gutter arch osteophytes about the patellofemoral joint. All  findings consistent with advanced osteoarthritis right knee  Xr Pelvis 1-2 Views  Result Date: 08/17/2016 AP the pelvis demonstrates intact hip joints. No evidence of avascular necrosis or significant degenerative change. No ectopic calcification.    PMFS History: Patient Active Problem List   Diagnosis Date Noted  . Urticaria 11/17/2015  . Angioedema 11/17/2015  . Pain in joint, lower leg 12/10/2013   Past Medical History:  Diagnosis Date  . Diabetes mellitus   . Hypertension   . Sarcoidosis     Family History  Problem Relation Age of Onset  . Asthma Brother   . Allergic rhinitis Neg Hx   .  Angioedema Neg Hx   . Atopy Neg Hx   . Eczema Neg Hx   . Immunodeficiency Neg Hx   . Urticaria Neg Hx     Past Surgical History:  Procedure Laterality Date  . CESAREAN SECTION    . CHOLECYSTECTOMY     Social History   Occupational History  . Not on file.   Social History Main Topics  . Smoking status: Never Smoker  . Smokeless tobacco: Never Used  . Alcohol use No  . Drug use: No  . Sexual activity: Yes    Birth control/ protection: Post-menopausal     Garald Balding, MD   Note - This record has been created using Editor, commissioning.  Chart creation errors have been sought, but may not always  have been located. Such creation errors do not reflect on  the standard of medical care.

## 2016-08-18 ENCOUNTER — Other Ambulatory Visit (HOSPITAL_COMMUNITY): Payer: Self-pay | Admitting: Internal Medicine

## 2016-08-18 DIAGNOSIS — R109 Unspecified abdominal pain: Secondary | ICD-10-CM

## 2016-08-24 ENCOUNTER — Ambulatory Visit (HOSPITAL_COMMUNITY)
Admission: RE | Admit: 2016-08-24 | Discharge: 2016-08-24 | Disposition: A | Discharge status: Home / Self Care | Payer: BLUE CROSS/BLUE SHIELD | Source: Ambulatory Visit | Attending: Internal Medicine | Admitting: Internal Medicine

## 2016-08-24 DIAGNOSIS — K760 Fatty (change of) liver, not elsewhere classified: Secondary | ICD-10-CM | POA: Diagnosis not present

## 2016-08-24 DIAGNOSIS — R109 Unspecified abdominal pain: Secondary | ICD-10-CM | POA: Insufficient documentation

## 2016-08-24 DIAGNOSIS — Z9049 Acquired absence of other specified parts of digestive tract: Secondary | ICD-10-CM | POA: Insufficient documentation

## 2017-01-11 ENCOUNTER — Other Ambulatory Visit: Payer: Self-pay | Admitting: Obstetrics and Gynecology

## 2017-04-06 ENCOUNTER — Ambulatory Visit (INDEPENDENT_AMBULATORY_CARE_PROVIDER_SITE_OTHER): Payer: BLUE CROSS/BLUE SHIELD | Admitting: Family Medicine

## 2017-04-06 ENCOUNTER — Other Ambulatory Visit: Payer: Self-pay

## 2017-04-06 ENCOUNTER — Encounter: Payer: Self-pay | Admitting: Family Medicine

## 2017-04-06 VITALS — BP 130/70 | HR 61 | Temp 98.1°F | Resp 16 | Ht 65.0 in | Wt 259.0 lb

## 2017-04-06 DIAGNOSIS — Z23 Encounter for immunization: Secondary | ICD-10-CM

## 2017-04-06 DIAGNOSIS — Z79899 Other long term (current) drug therapy: Secondary | ICD-10-CM | POA: Diagnosis not present

## 2017-04-06 DIAGNOSIS — I152 Hypertension secondary to endocrine disorders: Secondary | ICD-10-CM | POA: Insufficient documentation

## 2017-04-06 DIAGNOSIS — I10 Essential (primary) hypertension: Secondary | ICD-10-CM | POA: Diagnosis not present

## 2017-04-06 DIAGNOSIS — Z794 Long term (current) use of insulin: Secondary | ICD-10-CM

## 2017-04-06 DIAGNOSIS — M1712 Unilateral primary osteoarthritis, left knee: Secondary | ICD-10-CM | POA: Diagnosis not present

## 2017-04-06 DIAGNOSIS — D860 Sarcoidosis of lung: Secondary | ICD-10-CM

## 2017-04-06 DIAGNOSIS — E119 Type 2 diabetes mellitus without complications: Secondary | ICD-10-CM | POA: Diagnosis not present

## 2017-04-06 DIAGNOSIS — R011 Cardiac murmur, unspecified: Secondary | ICD-10-CM

## 2017-04-06 DIAGNOSIS — E1169 Type 2 diabetes mellitus with other specified complication: Secondary | ICD-10-CM

## 2017-04-06 DIAGNOSIS — E785 Hyperlipidemia, unspecified: Secondary | ICD-10-CM

## 2017-04-06 DIAGNOSIS — E1159 Type 2 diabetes mellitus with other circulatory complications: Secondary | ICD-10-CM | POA: Diagnosis not present

## 2017-04-06 MED ORDER — LOSARTAN POTASSIUM 50 MG PO TABS: 50.0000 mg | ORAL_TABLET | Freq: Every day | ORAL | 0 refills | Status: DC

## 2017-04-06 MED ORDER — KETOROLAC TROMETHAMINE 60 MG/2ML IM SOLN
60.0000 mg | Freq: Once | INTRAMUSCULAR | Status: AC
  Administered 2017-04-06: 60 mg via INTRAMUSCULAR

## 2017-04-06 NOTE — Patient Instructions (Signed)
Cardiology referral and ECHO Pulmonary referral for lungs/sarcoid  Stop the amlodipine Start the losartan

## 2017-04-06 NOTE — Progress Notes (Signed)
Patient ID: Ann Roberson, female    DOB: 05-10-1950, 67 y.o.   MRN: [id number]  Chief Complaint  Patient presents with  . Hyperlipidemia  . Diabetes  . Hypertension    Allergies Erythromycin  Subjective:   Ann Roberson is a 67 y.o. female who presents to [facility] today.  HPI  Here to establish care as a new patient visit.  She also reports that she seeks evaluation for her diabetes, hypertension, hyperlipidemia, and knee pain.    Ann Roberson is a very nice lady who grew up in [place] and still lives here.  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  She reports that in 2003 she was diagnosed with sarcoidosis after a skin biopsy. Subsequently, had sarcoidosis in her left lung and was on long term prednisone.  Reports that as a result of the prednisone she developed type 2 diabetes and gained a lot of weight.  Reports that she subsequently came off of the prednisone and has not received any treatment or evaluation for her sarcoidosis in a long time. Reports that 1 of her main concerns is the pain that she has in her knees.  Reports that she has been seeing orthopedic doctor in [place] for knee pain quite some time.  She reports she was told that she could not have knee replacement surgery until she lost 50 more pounds.  She reports that she has already lost 49 pounds.  Reports that her knees cause her a terrible amount of pain.  She reports she wakes up with pain in the morning and goes to bed with pain at night.  She is not on any medication for pain at this time other than Neurontin for neuropathic pain.  She reports that she did re-see a NSAID shot at the emergency department at [facility] in the past which helped her greatly.  She reports she would like a shot today and she would also like a referral to see another orthopedic doctor.  She reports she has been seen by Dr. Aline Brochure in the past and believe that he had wanted to do surgery but she was not ready  at that point.  She reports that the pain in her knees limits her activities and she deals with that on a daily basis.  She is very thankful that she has a job where she does not have to be running around and on her feet all the time.  She reports that her blood pressure is pretty well controlled.  She reports she was previously on an ACE inhibitor and a medicine for diabetes but had some itching at that time and so she just stopped both of them.  Reports that she never really had an allergic reaction to either of the medications.  She reports that if there is a medicine for her blood pressure that could help decrease her risk of ending up on dialysis that she would like to be on that medication.  She reports that since she has been on the amlodipine that she is always had swelling in her lower extremities.  Does take HCTZ each day.  Has been on simvastatin for cholesterol for several years.  Denies any myalgias.  Reports that all her pains are basically in her knees.  Reports she was told that she had a heart murmur in the past but is never had any evaluation of it.  Reports that she occasionally feels some shortness of breath with exertion but  has related this to her sarcoidosis.  Reports that occurs very sporadically.  Denies any pain in her chest.  No palpitations.  Reports her energy level is pretty good for the most part.  Has been able to lose weight through changes in her diet.  Reports that her last hemoglobin A1c was well controlled and at that time 1 of her diabetic medications was stopped.  Denies any lesions in her feet.  Reports she does use Neurontin for tingling in her hands and her feet.  Patient reports that about 6 months ago she felt like she had an episode of heartburn and it occurred while she was at rest.  It only lasted for several minutes.  There was no pain that radiated down her arm and no associated nausea, sweating, or shortness of breath.  She does report that she has had some  heart studies done in the past but reports it was over 15 years ago.  She denies any other chest pain or heartburn symptoms.     Diabetes  She presents for her follow-up diabetic visit. She has type 2 diabetes mellitus. Her disease course has been improving. There are no hypoglycemic associated symptoms. Pertinent negatives for hypoglycemia include no dizziness, headaches or tremors. Associated symptoms include foot paresthesias and weight loss. Pertinent negatives for diabetes include no blurred vision, no chest pain, no fatigue, no foot ulcerations, no polydipsia, no polyphagia, no polyuria, no visual change and no weakness. There are no hypoglycemic complications. Symptoms are improving. There are no diabetic complications. Risk factors for coronary artery disease include family history, dyslipidemia, diabetes mellitus, hypertension and obesity. Current diabetic treatment includes insulin injections and oral agent (monotherapy). She is compliant with treatment all of the time. Her weight is decreasing steadily. She is following a diabetic and generally healthy diet. Meal planning includes avoidance of concentrated sweets. She has had a previous visit with a dietitian. She participates in exercise intermittently. Her overall blood glucose range is 110-130 mg/dl. An ACE inhibitor/angiotensin II receptor blocker is being taken. She does not see a podiatrist.Eye exam is current.    Past Medical History:  Diagnosis Date  . Diabetes mellitus   . Hyperlipidemia   . Hypertension   . Sarcoidosis     Past Surgical History:  Procedure Laterality Date  . CESAREAN SECTION    . CHOLECYSTECTOMY    . TUBAL LIGATION      Family History  Problem Relation Age of Onset  . Asthma Brother   . Cancer Mother   . Cancer Father   . Cancer Brother   . Diabetes Brother   . Allergic rhinitis Neg Hx   . Angioedema Neg Hx   . Atopy Neg Hx   . Eczema Neg Hx   . Immunodeficiency Neg Hx   . Urticaria Neg Hx        Social History   Socioeconomic History  . Marital status: Single    Spouse name: None  . Number of children: None  . Years of education: None  . Highest education level: None  Social Needs  . Financial resource strain: None  . Food insecurity - worry: None  . Food insecurity - inability: None  . Transportation needs - medical: None  . Transportation needs - non-medical: None  Occupational History  . None  Tobacco Use  . Smoking status: Never Smoker  . Smokeless tobacco: Never Used  Substance and Sexual Activity  . Alcohol use: No  . Drug use: No  .  Sexual activity: Not Currently    Birth control/protection: Post-menopausal  Other Topics Concern  . None  Social History Narrative   Lives in [place], [place]. 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   Current Outpatient Medications on File Prior to Visit  Medication Sig Dispense Refill  . amLODipine (NORVASC) 10 MG tablet TK 1 T PO QD  3  . gabapentin (NEURONTIN) 300 MG capsule Take 600 mg by mouth 3 (three) times daily.    . hydrochlorothiazide (HYDRODIURIL) 25 MG tablet   1  . insulin glargine (LANTUS) 100 UNIT/ML injection Inject 42 Units into the skin at bedtime.     . metFORMIN (GLUCOPHAGE) 1000 MG tablet Take 1,000 mg by mouth 2 (two) times daily.      . simvastatin (ZOCOR) 20 MG tablet Take 20 mg by mouth every evening.     No current facility-administered medications on file prior to visit.      Review of Systems  Constitutional: Positive for weight loss. Negative for activity change, appetite change, chills, diaphoresis, fatigue, fever and unexpected weight change.  HENT: Negative for nosebleeds, trouble swallowing and voice change.   Eyes: Negative for blurred vision and visual disturbance.  Respiratory: Negative for choking, chest tightness and wheezing.        Reports that does cough at times and can tell when sarcoid is acting up b/c of cough. Gets winded at times when  walk.   Cardiovascular: Negative for chest pain, palpitations and leg swelling.  Gastrointestinal: Negative for abdominal pain, constipation, diarrhea, nausea and vomiting.  Endocrine: Negative for polydipsia, polyphagia and polyuria.  Genitourinary: Negative for dysuria, frequency, hematuria and urgency.  Musculoskeletal: Positive for arthralgias.       Reports terrible pain in knees. Left knee is worse than the right.   Skin: Negative for rash.  Neurological: Negative for dizziness, tremors, syncope, facial asymmetry, weakness, numbness and headaches.  Hematological: Negative for adenopathy. Does not bruise/bleed easily.  Psychiatric/Behavioral: Negative for dysphoric mood.     Objective:   BP 130/70 (BP Location: Left Arm, Patient Position: Sitting, Cuff Size: Normal)   Pulse 61   Temp 98.1 F (36.7 C) (Temporal)   Resp 16   Ht 5\' 5"  (1.651 m)   Wt 259 lb (117.5 kg)   SpO2 97%   BMI 43.10 kg/m   Physical Exam  Constitutional: She is oriented to person, place, and time. She appears well-developed and well-nourished. No distress.  HENT:  Head: Normocephalic and atraumatic.  Eyes: Pupils are equal, round, and reactive to light. No scleral icterus.  Neck: Normal range of motion. Neck supple. No thyromegaly present.  Cardiovascular: Normal rate and regular rhythm.  Murmur heard.  Systolic murmur is present with a grade of 3/6. Pulmonary/Chest: Effort normal and breath sounds normal. No respiratory distress. She has no wheezes.  Abdominal: Soft. Bowel sounds are normal.  Neurological: She is alert and oriented to person, place, and time. No cranial nerve deficit.  Skin: Skin is warm and dry.  Psychiatric: She has a normal mood and affect. Her behavior is normal. Judgment and thought content normal.  Nursing note and vitals reviewed.    Assessment and Plan  1. Controlled type 2 diabetes mellitus without complication, with long-term current use of insulin ([place]) Check labs  today.  Will adjust medications as needed.  Counseled regarding hypoglycemia.  Patient understands treatment for low blood sugars.  Patient is not currently on aspirin  but will discuss at next visit. - Hemoglobin A1c - Microalbumin / creatinine urine ratio - Pneumococcal conjugate vaccine 13-valent IM  2. Hypertension associated with diabetes ([place]) Controlled but not currently on ACE or ARB therapy and current edema with amlodipine.  DC amlodipine at this time - losartan (COZAAR) 50 MG tablet; Take 1 tablet (50 mg total) by mouth daily.  Dispense: 90 tablet; Refill: 0  3. Hyperlipidemia associated with type 2 diabetes mellitus ([place]) Plan on checking lipid panel today.  If LDL is not 70 or less we will plan on switching to atorvastatin and titrate dose up as needed. - Lipid panel  4. High risk medication use  - Hepatic function panel - CBC with Differential/Platelet  5. Sarcoidosis of lung ([place])  - Ambulatory referral to Pulmonology  6. Heart murmur Refer to cardiology and refer for echo. - Ambulatory referral to Cardiology - ECHOCARDIOGRAM COMPLETE; Future  7. Osteoarthritis of left knee, unspecified osteoarthritis type Discussed risks of NSAIDs with patient today.  She would like a Toradol injection.  Patient will follow up with Dr. Luna Glasgow or Dr. Aline Brochure.Discussed risks of cardiovascular thrombotic events related to NSAIDS. Discussed increased risk of AMI and CVA. Discussed risk of serious GI adverse events including bleeding, ulcers, and perforation. Patient understands risks of this medication.   - AMB referral to orthopedics - ketorolac (TORADOL) injection 60 mg  Return in about 4 weeks (around 05/04/2017) for follow up. Caren Macadam, MD 04/06/2017

## 2017-04-12 ENCOUNTER — Ambulatory Visit (HOSPITAL_COMMUNITY)
Admission: RE | Admit: 2017-04-12 | Discharge: 2017-04-12 | Disposition: A | Discharge status: Home / Self Care | Payer: BLUE CROSS/BLUE SHIELD | Source: Ambulatory Visit | Attending: Family Medicine | Admitting: Family Medicine

## 2017-04-12 DIAGNOSIS — I5030 Unspecified diastolic (congestive) heart failure: Secondary | ICD-10-CM | POA: Insufficient documentation

## 2017-04-12 DIAGNOSIS — R011 Cardiac murmur, unspecified: Secondary | ICD-10-CM | POA: Diagnosis not present

## 2017-04-12 DIAGNOSIS — I060 Rheumatic aortic stenosis: Secondary | ICD-10-CM | POA: Insufficient documentation

## 2017-04-12 LAB — ECHOCARDIOGRAM COMPLETE
AO mean calculated velocity dopler: 165 cm/s
AV Area VTI index: 0.37 cm2/m2
AV Area VTI: 1.01 cm2
AV Area mean vel: 0.98 cm2
AV Mean grad: 13 mmHg
AV Peak grad: 25 mmHg
AV VEL mean LVOT/AV: 0.49
AV area mean vel ind: 0.41 cm2/m2
AV peak Index: 0.42
AV pk vel: 248 cm/s
AV vel: 0.88
Ao pk vel: 0.5 m/s
E decel time: 218 msec
E/e' ratio: 10.19
FS: 24 % — AB (ref 28–44)
IVS/LV PW RATIO, ED: 1.16
LA ID, A-P, ES: 34 mm
LA diam end sys: 34 mm
LA diam index: 1.43 cm/m2
LA vol A4C: 52.7 ml
LA vol index: 19.4 mL/m2
LA vol: 46.2 mL
LV E/e' medial: 10.19
LV E/e'average: 10.19
LV PW d: 11.1 mm — AB (ref 0.6–1.1)
LV dias vol index: 35 mL/m2
LV dias vol: 83 mL (ref 46–106)
LV e' LATERAL: 10.7 cm/s
LV sys vol index: 13 mL/m2
LV sys vol: 31 mL
LVOT SV: 46 mL
LVOT VTI: 22.9 cm
LVOT area: 2.01 cm2
LVOT diameter: 16 mm
LVOT peak VTI: 0.44 cm
LVOT peak grad rest: 6 mmHg
LVOT peak vel: 125 cm/s
Lateral S' vel: 8.27 cm/s
MV Dec: 218
MV Peak grad: 5 mmHg
MV pk A vel: 110 m/s
MV pk E vel: 109 m/s
Simpson's disk: 63
Stroke v: 52 ml
TAPSE: 16.9 mm
TDI e' lateral: 10.7
TDI e' medial: 6.85
VTI: 52.6 cm
Valve area index: 0.37
Valve area: 0.88 cm2

## 2017-04-12 NOTE — Progress Notes (Signed)
*  PRELIMINARY RESULTS* Echocardiogram 2D Echocardiogram has been performed.  Ann Roberson 04/12/2017, 3:30 PM

## 2017-04-19 ENCOUNTER — Ambulatory Visit (INDEPENDENT_AMBULATORY_CARE_PROVIDER_SITE_OTHER): Payer: BLUE CROSS/BLUE SHIELD | Admitting: Orthopaedic Surgery

## 2017-04-25 ENCOUNTER — Telehealth: Payer: Self-pay | Admitting: Family Medicine

## 2017-04-25 DIAGNOSIS — D508 Other iron deficiency anemias: Secondary | ICD-10-CM

## 2017-04-25 LAB — CBC WITH DIFFERENTIAL/PLATELET
Basophils Absolute: 32 cells/uL (ref 0–200)
Basophils Relative: 0.6 %
Eosinophils Absolute: 167 cells/uL (ref 15–500)
Eosinophils Relative: 3.1 %
HCT: 35 % (ref 35.0–45.0)
Hemoglobin: 10.9 g/dL — ABNORMAL LOW (ref 11.7–15.5)
Lymphs Abs: 1453 cells/uL (ref 850–3900)
MCH: 27 pg (ref 27.0–33.0)
MCHC: 31.1 g/dL — ABNORMAL LOW (ref 32.0–36.0)
MCV: 86.8 fL (ref 80.0–100.0)
MPV: 9.4 fL (ref 7.5–12.5)
Monocytes Relative: 11.5 %
Neutro Abs: 3127 cells/uL (ref 1500–7800)
Neutrophils Relative %: 57.9 %
Platelets: 290 10*3/uL (ref 140–400)
RBC: 4.03 10*6/uL (ref 3.80–5.10)
RDW: 12.5 % (ref 11.0–15.0)
Total Lymphocyte: 26.9 %
WBC mixed population: 621 cells/uL (ref 200–950)
WBC: 5.4 10*3/uL (ref 3.8–10.8)

## 2017-04-25 LAB — HEPATIC FUNCTION PANEL
AG Ratio: 1.1 (calc) (ref 1.0–2.5)
ALT: 13 U/L (ref 6–29)
AST: 12 U/L (ref 10–35)
Albumin: 3.8 g/dL (ref 3.6–5.1)
Alkaline phosphatase (APISO): 67 U/L (ref 33–130)
Bilirubin, Direct: 0.1 mg/dL (ref 0.0–0.2)
Globulin: 3.4 g/dL (calc) (ref 1.9–3.7)
Indirect Bilirubin: 0.2 mg/dL (calc) (ref 0.2–1.2)
Total Bilirubin: 0.3 mg/dL (ref 0.2–1.2)
Total Protein: 7.2 g/dL (ref 6.1–8.1)

## 2017-04-25 LAB — LIPID PANEL
Cholesterol: 132 mg/dL (ref <200)
HDL: 48 mg/dL — ABNORMAL LOW (ref >50)
LDL Cholesterol (Calc): 61 mg/dL (calc)
Non-HDL Cholesterol (Calc): 84 mg/dL (calc) (ref <130)
Total CHOL/HDL Ratio: 2.8 (calc) (ref <5.0)
Triglycerides: 147 mg/dL (ref <150)

## 2017-04-25 LAB — HEMOGLOBIN A1C
Hgb A1c MFr Bld: 6.5 % of total Hgb — ABNORMAL HIGH (ref <5.7)
Mean Plasma Glucose: 140 (calc)
eAG (mmol/L): 7.7 (calc)

## 2017-04-25 LAB — MICROALBUMIN / CREATININE URINE RATIO
Creatinine, Urine: 142 mg/dL (ref 20–275)
Microalb Creat Ratio: 30 mcg/mg creat — ABNORMAL HIGH (ref <30)
Microalb, Ur: 4.3 mg/dL

## 2017-04-25 NOTE — Telephone Encounter (Signed)
Please call patient and advise her that her blood sugars are under good control.  Her hemoglobin A1c is exactly where it should be.  She should continue her cholesterol medicine because her cholesterol looks great.  Her LDL is 61.  Her cholesterol medication is working.  My concern is that her hemoglobin is 10.9.  She is anemic.  She should not be anemic at her age.  Does she have a history of anemia in the past.  Please advise her that she will need to follow-up and discuss this but I am going to go ahead and place a referral for her to get a colonoscopy to make sure she is not losing blood through her GI system.  Please advise her to keep her scheduled follow-up visit.

## 2017-04-25 NOTE — Telephone Encounter (Signed)
Patient informed of message below, verbalized understanding.  

## 2017-04-26 ENCOUNTER — Encounter: Payer: Self-pay | Admitting: Internal Medicine

## 2017-05-01 ENCOUNTER — Ambulatory Visit (INDEPENDENT_AMBULATORY_CARE_PROVIDER_SITE_OTHER)
Admission: RE | Admit: 2017-05-01 | Discharge: 2017-05-01 | Disposition: A | Discharge status: Home / Self Care | Payer: BLUE CROSS/BLUE SHIELD | Source: Ambulatory Visit | Attending: Internal Medicine | Admitting: Internal Medicine

## 2017-05-01 ENCOUNTER — Encounter: Payer: Self-pay | Admitting: Internal Medicine

## 2017-05-01 ENCOUNTER — Ambulatory Visit (INDEPENDENT_AMBULATORY_CARE_PROVIDER_SITE_OTHER): Payer: BLUE CROSS/BLUE SHIELD | Admitting: Internal Medicine

## 2017-05-01 VITALS — BP 150/82 | HR 110 | Ht 65.0 in | Wt 261.6 lb

## 2017-05-01 DIAGNOSIS — R0600 Dyspnea, unspecified: Secondary | ICD-10-CM

## 2017-05-01 DIAGNOSIS — R0609 Other forms of dyspnea: Secondary | ICD-10-CM

## 2017-05-01 DIAGNOSIS — R05 Cough: Secondary | ICD-10-CM | POA: Diagnosis not present

## 2017-05-01 DIAGNOSIS — R058 Other specified cough: Secondary | ICD-10-CM

## 2017-05-01 NOTE — Progress Notes (Addendum)
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 Due to recurrent sarcoid   05/01/2017 1st [facility] office visit/ Wert   Chief Complaint  Patient presents with  . Pulmonary Consult    Referred by Dr. Mannie Stabile for Sarcoid eval. Pt states she was dxed with Sarcoid in 2003 by skin bx. She states she has been wheezing and coughing- comes and goes. Cough is non prod.   sob x one year can still do [employer] leaning on cart / has to use scooter to do wm/ uses [facility] parking/ no 02  No inhalers Sleeps on L side / feels choking immediately when try to lie down  on back  X decades  Stopped lisinopril 10/2015 due to hives/ not cough (as far as she can remember) Cough comes and goes day > noct and non productive ? Worse with voice use assoc sometime with audible wheezing  No better on symbicort  No obvious day to day or daytime variability or assoc excess/ purulent sputum or mucus plugs or hemoptysis or cp or chest tightness,  or overt sinus or hb symptoms. No unusual exposure hx or h/o childhood pna/ asthma or knowledge of premature birth.  Sleeping ok on L side down without nocturnal  or early am exacerbation  of respiratory  c/o's or need for noct saba. Also denies any obvious fluctuation of symptoms with weather or environmental changes or other aggravating or alleviating factors except as outlined above   Current Allergies, Complete Past Medical History, Past Surgical History, Family History, and Social History were reviewed in [facility] record.  ROS  The following are not active complaints unless bolded Hoarseness, sore throat, dysphagia, dental problems, itching, sneezing,  nasal congestion or discharge of excess mucus or purulent secretions, ear ache,   fever, chills, sweats, unintended wt loss or wt gain, classically pleuritic or exertional cp,  orthopnea pnd or leg swelling, presyncope, palpitations, abdominal pain, anorexia, nausea, vomiting, diarrhea  or change in bowel habits or change in bladder habits, change in stools or change in urine, dysuria, hematuria,  rash, arthralgias, visual complaints, headache, numbness, weakness or ataxia or problems with walking or coordination,  change in mood/affect or memory.        Current Meds  Medication Sig  . acetaminophen (TYLENOL) 500 MG tablet Take 500 mg by mouth every 6 (six) hours as needed.  . diphenhydrAMINE (BENADRYL ALLERGY) 25 MG tablet Take 25 mg by mouth every 6 (six) hours as needed.  . gabapentin (NEURONTIN) 300 MG capsule Take 600 mg by mouth 3 (three) times daily.  . hydrochlorothiazide (HYDRODIURIL) 25 MG tablet   . insulin glargine (LANTUS) 100 UNIT/ML injection Inject 42 Units into the skin at bedtime.   [name] losartan (COZAAR) 50 MG tablet Take 1 tablet (50 mg total) by mouth daily.  . metFORMIN (GLUCOPHAGE) 1000 MG tablet Take 1,000 mg by mouth 2 (two) times daily.    [name]  POTASSIUM PO Take 1 tablet by mouth daily. ? strength  . simvastatin (ZOCOR) 20 MG tablet Take 20 mg by mouth every evening.          Review of Systems     Objective:   Physical Exam    massively obese bf can barely get out of chair s assistance/ can't get on exam table   Wt Readings from Last 3 Encounters:  05/01/17 261 lb 9.6 oz (118.7 kg)  04/06/17 259 lb (117.5 kg)  08/17/16 258 lb (117 kg)     Vital signs reviewed - Note on arrival 02 sats  95% on RA     HEENT: nl dentition, turbinates bilaterally, and oropharynx. Nl external ear canals without cough  reflex   NECK :  without JVD/Nodes/TM/ nl carotid upstrokes bilaterally   LUNGS: no acc muscle use,  Nl contour chest which is clear to A and P bilaterally without cough on insp or exp maneuvers   CV:  RRR  no s3   II-III/VI SEM no increase in P2, and  1+ pitting both ankles  ABD:  Massive/ poor excursion. No tenderness  No bruits or organomegaly appreciated, bowel sounds nl  MS:  Very slow/limps with cane/ ext warm without deformities, calf tenderness, cyanosis or clubbing No obvious joint restrictions   SKIN: warm and dry without lesions    NEURO:  alert, approp, nl sensorium with  no motor or cerebellar deficits apparent.    CXR PA and Lateral:   05/01/2017 :    I personally reviewed images and agree with radiology impression as follows:   Several areas of calcification in nonenlarged lymph nodes consistent with prior granulomatous disease. No edema or consolidation.      Assessment:

## 2017-05-01 NOTE — Patient Instructions (Addendum)
Pantoprazole (protonix) 40 mg   Take  30-60 min before first meal of the day and Pepcid (famotidine)  20 mg one @  bedtime until return to office - this is the best way to tell whether stomach acid is contributing to your problem.    GERD (REFLUX)  is an extremely common cause of respiratory symptoms just like yours , many times with no obvious heartburn at all.    It can be treated with medication, but also with lifestyle changes including elevation of the head of your bed (ideally with 6 inch  bed blocks),  Smoking cessation, avoidance of late meals, excessive alcohol, and avoid fatty foods, chocolate, peppermint, colas, red wine, and acidic juices such as orange juice.  NO MINT OR MENTHOL PRODUCTS SO NO COUGH DROPS   USE SUGARLESS CANDY INSTEAD (Jolley ranchers or Stover's or Life Savers) or even ice chips will also do - the key is to swallow to prevent all throat clearing. NO OIL BASED VITAMINS - use powdered substitutes.   Please remember to go to the  x-ray department downstairs in the basement  for your tests - we will call you with the results when they are available.     Please schedule a follow up office visit in 6 weeks, call sooner if needed with PFTs

## 2017-05-02 ENCOUNTER — Other Ambulatory Visit: Payer: Self-pay | Admitting: Internal Medicine

## 2017-05-02 ENCOUNTER — Encounter: Payer: Self-pay | Admitting: Internal Medicine

## 2017-05-02 DIAGNOSIS — R05 Cough: Secondary | ICD-10-CM | POA: Insufficient documentation

## 2017-05-02 DIAGNOSIS — R058 Other specified cough: Secondary | ICD-10-CM | POA: Insufficient documentation

## 2017-05-02 MED ORDER — FAMOTIDINE 20 MG PO TABS: 20.0000 mg | ORAL_TABLET | Freq: Every day | ORAL | 2 refills | Status: DC

## 2017-05-02 MED ORDER — PANTOPRAZOLE SODIUM 40 MG PO TBEC: 40.0000 mg | DELAYED_RELEASE_TABLET | Freq: Every day | ORAL | 2 refills | Status: DC

## 2017-05-02 NOTE — Progress Notes (Signed)
Spoke with pt and notified of results per Dr. Wert. Pt verbalized understanding and denied any questions. 

## 2017-05-02 NOTE — Assessment & Plan Note (Signed)
Body mass index is 43.53 kg/m.  -  trending up Lab Results  Component Value Date   TSH 1.22 11/17/2015     Contributing to gerd risk/ doe/reviewed the need and the process to achieve and maintain neg calorie balance > defer f/u primary care including intermittently monitoring thyroid status

## 2017-05-02 NOTE — Assessment & Plan Note (Addendum)
Upper airway cough syndrome (previously labeled PNDS),  is so named because it's frequently impossible to sort out how much is  CR/sinusitis with freq throat clearing (which can be related to primary GERD)   vs  causing  secondary (" extra esophageal")  GERD from wide swings in gastric pressure that occur with throat clearing, often  promoting self use of mint and menthol lozenges that reduce the lower esophageal sphincter tone and exacerbate the problem further in a cyclical fashion.   These are the same pts (now being labeled as having "irritable larynx syndrome" by some cough centers) who not infrequently have a history of having failed to tolerate ace inhibitors,  dry powder inhalers or biphosphonates or report having atypical/extraesophageal reflux symptoms that don't respond to standard doses of PPI  and are easily confused as having aecopd or asthma flares by even experienced allergists/ pulmonologists (myself included).   The immediate choking when lying on her back is a typical hx of uacs as is the cough worse with voice use and "audible wheeze" on symbicort s any evidence at all of airflow obst on today's spirometry so   rec start with max rx for gerd and then f/u in 6 weeks with pfts   Advised pt The standardized cough guidelines published in Chest by Lissa Morales in 2006 are still the best available and consist of a multiple step process (up to 12!) , not a single office visit,  and are intended  to address this problem logically,  with an alogrithm dependent on response to empiric treatment at  each progressive step  to determine a specific diagnosis with  minimal addtional testing needed. Therefore if adherence is an issue or can't be accurately verified,  it's very unlikely the standard evaluation and treatment will be successful here.    Furthermore, response to therapy (other than acute cough suppression, which should only be used short term with avoidance of narcotic containing cough  syrups if possible), can be a gradual process for which the patient is not likely to  perceive immediate benefit.  Unlike going to an eye doctor where the best perscription is almost always the first one and is immediately effective, this is almost never the case in the management of chronic cough syndromes. Therefore the patient needs to commit up front to consistently adhere to recommendations  for up to 6 weeks of therapy directed at the likely underlying problem(s) before the response can be reasonably evaluated.    Total time devoted to counseling  > 50 % of initial 60 min office visit:  review case with pt/ discussion of options/alternatives/ personally creating written customized instructions  in presence of pt  then going over those specific  Instructions directly with the pt including how to use all of the meds but in particular covering each new medication in detail and the difference between the maintenance= "automatic" meds and the prns using an action plan format for the latter (If this problem/symptom => do that organization reading Left to right).  Please see AVS from this visit for a full list of these instructions which I personally wrote for this pt and  are unique to this visit.

## 2017-05-02 NOTE — Assessment & Plan Note (Addendum)
Echo 04/12/17  Several areas of calcification in nonenlarged lymph nodes consistent with prior granulomatous disease. No edema or consolidation. - Spirometry 05/01/2017  FEV1 1.72 (83%)  Ratio 83 on no rx / no curvature     Based on my observation today she is much more limited by her wt than her breathing at this point and is certainly at risk of diastolic dysfunction contributing to doe -  will ask her to return for full pfts after first trying to address  her chronic cough   However, A good rule of thumb is that >95% of pts with active sarcoid in any organ will have some plain cxr changes - on the other hand  if there are active pulmonary symptoms the cxr will look much worse than the patient:  No evidence of either scenario here so I very  strongly doubt active sarcoidosis in this lady 13 or so years since last dose of prednisone.

## 2017-05-03 ENCOUNTER — Other Ambulatory Visit: Payer: Self-pay | Admitting: Family Medicine

## 2017-05-03 DIAGNOSIS — I152 Hypertension secondary to endocrine disorders: Secondary | ICD-10-CM

## 2017-05-03 DIAGNOSIS — E1159 Type 2 diabetes mellitus with other circulatory complications: Secondary | ICD-10-CM

## 2017-05-03 DIAGNOSIS — I10 Essential (primary) hypertension: Principal | ICD-10-CM

## 2017-05-04 ENCOUNTER — Ambulatory Visit (INDEPENDENT_AMBULATORY_CARE_PROVIDER_SITE_OTHER): Payer: BLUE CROSS/BLUE SHIELD | Admitting: Family Medicine

## 2017-05-04 ENCOUNTER — Other Ambulatory Visit: Payer: Self-pay

## 2017-05-04 ENCOUNTER — Encounter: Payer: Self-pay | Admitting: Family Medicine

## 2017-05-04 VITALS — BP 146/76 | HR 88 | Temp 98.7°F | Resp 18 | Ht 65.0 in | Wt 259.1 lb

## 2017-05-04 DIAGNOSIS — D649 Anemia, unspecified: Secondary | ICD-10-CM | POA: Diagnosis not present

## 2017-05-04 DIAGNOSIS — M1712 Unilateral primary osteoarthritis, left knee: Secondary | ICD-10-CM

## 2017-05-04 DIAGNOSIS — E785 Hyperlipidemia, unspecified: Secondary | ICD-10-CM

## 2017-05-04 DIAGNOSIS — E1140 Type 2 diabetes mellitus with diabetic neuropathy, unspecified: Secondary | ICD-10-CM

## 2017-05-04 DIAGNOSIS — E1169 Type 2 diabetes mellitus with other specified complication: Secondary | ICD-10-CM

## 2017-05-04 DIAGNOSIS — R197 Diarrhea, unspecified: Secondary | ICD-10-CM

## 2017-05-04 DIAGNOSIS — Z23 Encounter for immunization: Secondary | ICD-10-CM | POA: Diagnosis not present

## 2017-05-04 DIAGNOSIS — Z79899 Other long term (current) drug therapy: Secondary | ICD-10-CM

## 2017-05-04 DIAGNOSIS — Z794 Long term (current) use of insulin: Secondary | ICD-10-CM

## 2017-05-04 MED ORDER — GABAPENTIN 300 MG PO CAPS: 600.0000 mg | ORAL_CAPSULE | Freq: Three times a day (TID) | ORAL | 3 refills | Status: DC

## 2017-05-04 MED ORDER — DICLOFENAC SODIUM 1 % TD GEL: 2.0000 g | Freq: Three times a day (TID) | TRANSDERMAL | 1 refills | Status: DC | PRN

## 2017-05-04 NOTE — Progress Notes (Signed)
Patient ID: Ann Roberson, female    DOB: 12/01/1950, 67 y.o.   MRN: [id number]  Chief Complaint  Patient presents with  . Hypertension    1 month f/u    Allergies Erythromycin  Subjective:   Ann Roberson is a 67 y.o. female who presents to [facility] today.  HPI Ann Roberson presents today for follow-up.  She reports that she has been doing well until this morning when she woke up and has had diarrhea throughout the day on and off.  She denies any abdominal pain or vomiting.  She reports that she is just had loose bowel movements and greater frequency of bowel movements throughout the day.  She reports that she has had approximately 5 episodes of diarrhea today.  She is eating and drinking well.  Denies any dizzy symptoms or lightheadedness.  She reports that she is urinating well.  She reports that her blood pressure is been running well and even though it is slightly elevated today she believes it is just because she is having a bad day and is not feeling well.  She does not want her blood pressure medication increased today.  She reports that she has been taking her insulin at 60 units at bedtime.  She denies any hypoglycemic episodes.  She denies any lesions on her feet.  She has been to see the pulmonologist since she was last seen here and reports he is treating her for reflux and does not believe that her sarcoid is acting up.  She denies any shortness of breath.  She reports she is still trying to lose weight by working on her diet.  She reports her exercise is limited due to her osteoarthritis of her knees.  She reports that she would very much like the medication to help with her knee pain.  She is refused NSAIDs in the past due to increased cardiovascular risk associated with this medication.  She reports that she does have a scheduled appointment to see the gastroenterologist for her anemia.  She denies any melena, hematochezia, or bright red blood per rectum.   She denies any hematuria.  She denies any vaginal bleeding.  She reports that she was anemic with her pregnancies but otherwise is never been told that she was anemic.  She reports that her previous primary care physician had given her Neurontin for her knee pain but it never seemed to help her.  She reports that she has not taken the Neurontin in several days because she is due for a refill but called over the weekend and it was denied.  She reports that the medication never really helped her knee anyway.  She reports that she does occasionally have some tingling in her feet but it occurs on a rare occasion.  She would like a refill of the medication to use when she does get the numbness and tingling in her feet.    Past Medical History:  Diagnosis Date  . Diabetes mellitus   . Hyperlipidemia   . Hypertension   . Sarcoidosis     Past Surgical History:  Procedure Laterality Date  . CESAREAN SECTION    . CHOLECYSTECTOMY    . TUBAL LIGATION      Family History  Problem Relation Age of Onset  . Asthma Brother   . Cancer Mother   . Cancer Father   . Cancer Brother   . Diabetes Brother   . Allergic rhinitis Neg Hx   .  Angioedema Neg Hx   . Atopy Neg Hx   . Eczema Neg Hx   . Immunodeficiency Neg Hx   . Urticaria Neg Hx      Social History   Socioeconomic History  . Marital status: Single    Spouse name: None  . Number of children: None  . Years of education: None  . Highest education level: None  Social Needs  . Financial resource strain: None  . Food insecurity - worry: None  . Food insecurity - inability: None  . Transportation needs - medical: None  . Transportation needs - non-medical: None  Occupational History  . None  Tobacco Use  . Smoking status: Never Smoker  . Smokeless tobacco: Never Used  Substance and Sexual Activity  . Alcohol use: No  . Drug use: No  . Sexual activity: Not Currently    Birth control/protection: Post-menopausal  Other Topics Concern   . None  Social History Narrative   Lives in [place], [place]. 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   Current Outpatient Medications on File Prior to Visit  Medication Sig Dispense Refill  . acetaminophen (TYLENOL) 500 MG tablet Take 500 mg by mouth every 6 (six) hours as needed.    . diphenhydrAMINE (BENADRYL ALLERGY) 25 MG tablet Take 25 mg by mouth every 6 (six) hours as needed.    . famotidine (PEPCID) 20 MG tablet Take 1 tablet (20 mg total) by mouth at bedtime. 30 tablet 2  . hydrochlorothiazide (HYDRODIURIL) 25 MG tablet   1  . insulin glargine (LANTUS) 100 UNIT/ML injection Inject 55 Units into the skin at bedtime.      [name] losartan (COZAAR) 50 MG tablet TAKE 1 TABLET BY MOUTH ONCE DAILY 90 tablet 0  . metFORMIN (GLUCOPHAGE) 1000 MG tablet Take 1,000 mg by mouth 2 (two) times daily.      . pantoprazole (PROTONIX) 40 MG tablet Take 1 tablet (40 mg total) by mouth daily. 30 tablet 2  . POTASSIUM PO Take 1 tablet by mouth daily. ? strength    . simvastatin (ZOCOR) 20 MG tablet Take 20 mg by mouth every evening.     No current facility-administered medications on file prior to visit.     Review of Systems  Constitutional: Negative for activity change, appetite change and fever.  Eyes: Negative for visual disturbance.  Respiratory: Negative for cough, chest tightness and shortness of breath.   Cardiovascular: Negative for chest pain, palpitations and leg swelling.  Gastrointestinal: Positive for diarrhea. Negative for abdominal pain, nausea and vomiting.  Genitourinary: Negative for dysuria, frequency and urgency.  Neurological: Negative for dizziness, syncope and light-headedness.  Hematological: Negative for adenopathy.     Objective:   BP (!) 146/76 (BP Location: Left Arm, Patient Position: Sitting, Cuff Size: Large)   Pulse 88   Temp 98.7 F (37.1 C) (Temporal)   Resp 18   Ht 5\' 5"  (1.651 m)   Wt 259 lb 1.9 oz (117.5 kg)   SpO2 95%    BMI 43.12 kg/m   Physical Exam  Constitutional: She is oriented to person, place, and time. She appears well-developed and well-nourished. No distress.  HENT:  Head: Normocephalic and atraumatic.  Mouth/Throat: Oropharynx is clear and moist.  Eyes: Pupils are equal, round, and reactive to light.  Neck: Normal range of motion. Neck supple. No thyromegaly present.  Cardiovascular: Normal rate, regular rhythm and normal heart sounds.  Pulmonary/Chest: Effort normal  and breath sounds normal. No respiratory distress.  Abdominal: Soft. Bowel sounds are normal. She exhibits no distension and no mass. There is no tenderness. There is no guarding.  Neurological: She is alert and oriented to person, place, and time. No cranial nerve deficit.  Skin: Skin is warm and dry.  Psychiatric: She has a normal mood and affect. Her behavior is normal.  Nursing note and vitals reviewed.    Assessment and Plan  1. Type 2 diabetes mellitus with diabetic neuropathy, with long-term current use of insulin (HCC) Discussed hemoglobin A1c goals.  Patient's current hemoglobin A1c is a 6.5%.  We discussed that due to her age and the amount of insulin that she is on that she is at greater risk for hypoglycemia than risks associated with hyperglycemia.  At this time will increase her insulin to Lantus, 55 units nightly.  Will refill her Neurontin to be used at bedtime when she has nerve pain.  She is to take 300 mg at bedtime as needed. - gabapentin (NEURONTIN) 300 MG capsule; Take 2 capsules (600 mg total) by mouth 3 (three) times daily.  Dispense: 180 capsule; Refill: 3  2. Need for Tdap vaccination Vaccination given - Tdap vaccine greater than or equal to 7yo IM  3. Hyperlipidemia associated with type 2 diabetes mellitus (HCC) LDL of 61.  Patient is currently at goal.  Continue statin medication and monitor LFTs periodically.  4. Morbid obesity due to excess calories (HCC) Continue diet changes and caloric  reduction in order to decrease weight.  5. Anemia, unspecified type Patient has upcoming appointment with GI for evaluation secondary to anemia.  Will check hemoglobin type and iron studies at this time to see if patient is iron deficient anemia. - Hemoglobinopathy Evaluation - Fe+TIBC+Fer  6. High risk medication use  - Basic metabolic panel  7. Osteoarthritis of left knee, unspecified osteoarthritis type Patient wishes to try medicine for osteoarthritis of her knee.  We did discuss risks of NSAIDs in the past.  We did discuss risks of Voltaren gel.  However the wrists are still the same they are decreased due to the fact that the medication is not orally administered.  She would like to try this medication at this time.  She was counseled on use.  It is recommended that she follow-up with orthopedics as directed - diclofenac sodium (VOLTAREN) 1 % GEL; Apply 2 g topically 3 (three) times daily as needed.  Dispense: 100 g; Refill: 1  8. Diarrhea, unspecified type Suspect viral versus secondary to food intolerance.  Supportive care and hydration discussed and encouraged.  Call with any questions, concerns, worrisome symptoms, or no resolution.  Blood pressure is not at goal today.  Patient wishes not to increase the medication because she feels it is elevated secondary to stress and feeling poorly.  We will plan to recheck in 2-4 weeks.  Compliance with medication discussed. Return in about 4 weeks (around 06/01/2017) for follow up. Caren Macadam, MD 05/04/2017

## 2017-05-11 ENCOUNTER — Encounter: Payer: Self-pay | Admitting: Cardiovascular Disease

## 2017-05-11 ENCOUNTER — Ambulatory Visit (INDEPENDENT_AMBULATORY_CARE_PROVIDER_SITE_OTHER): Payer: BLUE CROSS/BLUE SHIELD | Admitting: Cardiovascular Disease

## 2017-05-11 VITALS — BP 142/78 | HR 90 | Ht 65.0 in | Wt 269.0 lb

## 2017-05-11 DIAGNOSIS — IMO0001 Reserved for inherently not codable concepts without codable children: Secondary | ICD-10-CM

## 2017-05-11 DIAGNOSIS — I350 Nonrheumatic aortic (valve) stenosis: Secondary | ICD-10-CM

## 2017-05-11 DIAGNOSIS — I10 Essential (primary) hypertension: Secondary | ICD-10-CM | POA: Diagnosis not present

## 2017-05-11 DIAGNOSIS — R079 Chest pain, unspecified: Secondary | ICD-10-CM | POA: Diagnosis not present

## 2017-05-11 DIAGNOSIS — E119 Type 2 diabetes mellitus without complications: Secondary | ICD-10-CM

## 2017-05-11 DIAGNOSIS — Z794 Long term (current) use of insulin: Secondary | ICD-10-CM

## 2017-05-11 DIAGNOSIS — E785 Hyperlipidemia, unspecified: Secondary | ICD-10-CM

## 2017-05-11 NOTE — Progress Notes (Signed)
CARDIOLOGY CONSULT NOTE  Patient ID: Ann Roberson MRN: [id number] DOB/AGE: 12/20/1950 67 y.o.  Admit date: (Not on file) Primary Physician: Caren Macadam, MD Referring Physician: Dr. Mannie Stabile  Reason for Consultation: Cardiac murmur  HPI: Ann Roberson is a 67 y.o. female who is being seen today for the evaluation of cardiac murmur at the request of Caren Macadam, MD.   Past medical history includes hypertension, insulin-dependent diabetes mellitus, pulmonary sarcoidosis, and hyperlipidemia.  Her PCP appreciated a heart murmur and ordered an echocardiogram.  I reviewed the echocardiogram performed on 04/12/17 which demonstrated normal left ventricular systolic function and regional wall motion, LVEF 60-65%, mild LVH, grade 1 diastolic dysfunction with high ventricular filling pressures, mildly thickened and calcified aortic valve leaflets with mild stenosis, mean gradient 13 mmHg.  Chest x-ray on 05/01/17 showed several areas of calcification in nonenlarged lymph nodes consistent with prior granulomatous disease.  There was no edema or consolidation.  Lipid panel 04/24/17: Total cholesterol 132, HDL 48, triglycerides 147, LDL 61.  ECG performed today which I personally reviewed demonstrated sinus rhythm with PVCs and LVH.  Upon speaking with her, she tells me that her orthopedic surgeon was the first person to appreciate a cardiac murmur and inform her about it.  She has stable exertional dyspnea.    She does have episodic chest pain which can occur both with exertion and while at rest.  The chest pain is left-sided and associated with shortness of breath.  There is no associated nausea, vomiting, lightheadedness, or dizziness.  Episodes of pain last 2-3 minutes.  It appears she underwent cardiac catheterization several years ago.  She did not undergo any intervention.  I do not have a copy of this cardiac catheterization report.  She seldom has palpitations.  She  needs to have left total knee replacement surgery and was told she needs to lose 50 pounds.  She has lost 49 pounds.   Allergies  Allergen Reactions  . Erythromycin Nausea And Vomiting    Current Outpatient Medications  Medication Sig Dispense Refill  . acetaminophen (TYLENOL) 500 MG tablet Take 500 mg by mouth every 6 (six) hours as needed.    . diclofenac sodium (VOLTAREN) 1 % GEL Apply 2 g topically 3 (three) times daily as needed. 100 g 1  . diphenhydrAMINE (BENADRYL ALLERGY) 25 MG tablet Take 25 mg by mouth every 6 (six) hours as needed.    . famotidine (PEPCID) 20 MG tablet Take 1 tablet (20 mg total) by mouth at bedtime. 30 tablet 2  . gabapentin (NEURONTIN) 300 MG capsule Take 2 capsules (600 mg total) by mouth 3 (three) times daily. 180 capsule 3  . hydrochlorothiazide (HYDRODIURIL) 25 MG tablet   1  . insulin glargine (LANTUS) 100 UNIT/ML injection Inject 55 Units into the skin at bedtime.      [name] losartan (COZAAR) 50 MG tablet TAKE 1 TABLET BY MOUTH ONCE DAILY 90 tablet 0  . metFORMIN (GLUCOPHAGE) 1000 MG tablet Take 1,000 mg by mouth 2 (two) times daily.      . pantoprazole (PROTONIX) 40 MG tablet Take 1 tablet (40 mg total) by mouth daily. 30 tablet 2  . POTASSIUM PO Take 1 tablet by mouth daily. ? strength    . simvastatin (ZOCOR) 20 MG tablet Take 20 mg by mouth every evening.     No current facility-administered medications for this visit.     Past Medical History:  Diagnosis Date  . Diabetes mellitus   .  Hyperlipidemia   . Hypertension   . Sarcoidosis     Past Surgical History:  Procedure Laterality Date  . CESAREAN SECTION    . CHOLECYSTECTOMY    . TUBAL LIGATION      Social History   Socioeconomic History  . Marital status: Single    Spouse name: Not on file  . Number of children: Not on file  . Years of education: Not on file  . Highest education level: Not on file  Social Needs  . Financial resource strain: Not on file  . Food insecurity - worry:  Not on file  . Food insecurity - inability: Not on file  . Transportation needs - medical: Not on file  . Transportation needs - non-medical: Not on file  Occupational History  . Not on file  Tobacco Use  . Smoking status: Never Smoker  . Smokeless tobacco: Never Used  Substance and Sexual Activity  . Alcohol use: No  . Drug use: No  . Sexual activity: Not Currently    Birth control/protection: Post-menopausal  Other Topics Concern  . Not on file  Social History Narrative   Lives in [place], [place]. 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     No family history of premature CAD in 1st degree relatives.  Current Meds  Medication Sig  . acetaminophen (TYLENOL) 500 MG tablet Take 500 mg by mouth every 6 (six) hours as needed.  . diclofenac sodium (VOLTAREN) 1 % GEL Apply 2 g topically 3 (three) times daily as needed.  . diphenhydrAMINE (BENADRYL ALLERGY) 25 MG tablet Take 25 mg by mouth every 6 (six) hours as needed.  . famotidine (PEPCID) 20 MG tablet Take 1 tablet (20 mg total) by mouth at bedtime.  . gabapentin (NEURONTIN) 300 MG capsule Take 2 capsules (600 mg total) by mouth 3 (three) times daily.  . hydrochlorothiazide (HYDRODIURIL) 25 MG tablet   . insulin glargine (LANTUS) 100 UNIT/ML injection Inject 55 Units into the skin at bedtime.    [name] losartan (COZAAR) 50 MG tablet TAKE 1 TABLET BY MOUTH ONCE DAILY  . metFORMIN (GLUCOPHAGE) 1000 MG tablet Take 1,000 mg by mouth 2 (two) times daily.    . pantoprazole (PROTONIX) 40 MG tablet Take 1 tablet (40 mg total) by mouth daily.  [name] POTASSIUM PO Take 1 tablet by mouth daily. ? strength  . simvastatin (ZOCOR) 20 MG tablet Take 20 mg by mouth every evening.      Review of systems complete and found to be negative unless listed above in HPI    Physical exam Blood pressure (!) 142/78, pulse 90, height 5\' 5"  (1.651 m), weight 269 lb (122 kg), SpO2 96 %. General: NAD Neck: No JVD, no  thyromegaly or thyroid nodule.  Lungs: Clear to auscultation bilaterally with normal respiratory effort. CV: Nondisplaced PMI. Regular rate and rhythm, normal S1/S2, no B0/F7, 2/6 systolic murmur over RUSB.  No peripheral edema.  No carotid bruit.    Abdomen: Soft, nontender, no distention.  Skin: Intact without lesions or rashes.  Neurologic: Alert and oriented x 3.  Psych: Normal affect. Extremities: No clubbing or cyanosis.  HEENT: Normal.   ECG: Most recent ECG reviewed.   Labs: Lab Results  Component Value Date/Time   K 4.1 11/17/2015 02:43 PM   BUN 17 11/17/2015 02:43 PM   CREATININE 1.12 (H) 11/17/2015 02:43 PM   ALT 13 04/24/2017 07:16 AM   TSH 1.22 11/17/2015  02:43 PM   HGB 10.9 (L) 04/24/2017 07:16 AM     Lipids: Lab Results  Component Value Date/Time   CHOL 132 04/24/2017 07:16 AM   TRIG 147 04/24/2017 07:16 AM   HDL 48 (L) 04/24/2017 07:16 AM        ASSESSMENT AND PLAN:  1.  Aortic stenosis: This is mild in severity with a mean gradient of 13 mmHg.  I will monitor with clinical surveillance and echocardiography.  2.  Hypertension: Blood pressure is mildly elevated.  She is on losartan 50 mg and hydrochlorothiazide 25 mg.  Losartan was initiated in early January.  She may require a dose increase.  3.  Hyperlipidemia: Lipids reviewed above.  Continue simvastatin 20 mg.  4.  Insulin-dependent diabetes mellitus: Currently on insulin and metformin.  HbA1c 6.5% on 04/24/17.  5.  Chest pain: Typical and atypical symptoms with some occurring at rest and some with exertion.  She does have several cardiovascular risk factors.  It appears she underwent cardiac catheterization several years ago and I will have to try to see if a report of this can be obtained directly from the cath Lab. I will proceed with a nuclear myocardial perfusion imaging study to evaluate for ischemic heart disease (Lexiscan Myoview).    Disposition: Follow up in 3 months  Signed: Kate Sable, M.D., F.A.C.C.  05/11/2017, 2:46 PM

## 2017-05-11 NOTE — Patient Instructions (Signed)
Your physician recommends that you schedule a follow-up appointment in: 3 months with [facility]    Your physician has requested that you have a lexiscan myoview. For further information please visit [email or web address]. Please follow instruction sheet, as given.     Your physician recommends that you continue on your current medications as directed. Please refer to the Current Medication list given to you today.    If you need a refill on your cardiac medications before your next appointment, please call your pharmacy.      No lab work ordered today     Thank you for choosing [facility] !

## 2017-05-19 ENCOUNTER — Encounter (HOSPITAL_BASED_OUTPATIENT_CLINIC_OR_DEPARTMENT_OTHER)
Admission: RE | Admit: 2017-05-19 | Discharge: 2017-05-19 | Disposition: A | Discharge status: Home / Self Care | Payer: BLUE CROSS/BLUE SHIELD | Source: Ambulatory Visit | Attending: Cardiovascular Disease | Admitting: Cardiovascular Disease

## 2017-05-19 ENCOUNTER — Encounter (HOSPITAL_COMMUNITY)
Admission: RE | Admit: 2017-05-19 | Discharge: 2017-05-19 | Disposition: A | Discharge status: Home / Self Care | Payer: BLUE CROSS/BLUE SHIELD | Source: Ambulatory Visit | Attending: Cardiovascular Disease | Admitting: Cardiovascular Disease

## 2017-05-19 ENCOUNTER — Encounter (HOSPITAL_COMMUNITY): Payer: Self-pay

## 2017-05-19 DIAGNOSIS — R079 Chest pain, unspecified: Secondary | ICD-10-CM

## 2017-05-19 LAB — NM MYOCAR MULTI W/SPECT W/WALL MOTION / EF
LV dias vol: 110 mL (ref 46–106)
LV sys vol: 46 mL
Peak HR: 115 {beats}/min
RATE: 0.29
Rest HR: 70 {beats}/min
SDS: 4
SRS: 3
SSS: 7
TID: 1.02

## 2017-05-19 MED ORDER — TECHNETIUM TC 99M TETROFOSMIN IV KIT
10.0000 | PACK | Freq: Once | INTRAVENOUS | Status: AC | PRN
  Administered 2017-05-19: 10.5 via INTRAVENOUS

## 2017-05-19 MED ORDER — TECHNETIUM TC 99M TETROFOSMIN IV KIT
30.0000 | PACK | Freq: Once | INTRAVENOUS | Status: AC | PRN
  Administered 2017-05-19: 32 via INTRAVENOUS

## 2017-05-19 MED ORDER — REGADENOSON 0.4 MG/5ML IV SOLN
INTRAVENOUS | Status: AC
  Administered 2017-05-19: 0.4 mg via INTRAVENOUS
  Filled 2017-05-19: qty 5

## 2017-05-19 MED ORDER — SODIUM CHLORIDE 0.9% FLUSH
INTRAVENOUS | Status: AC
  Administered 2017-05-19: 10 mL via INTRAVENOUS
  Filled 2017-05-19: qty 10

## 2017-05-30 ENCOUNTER — Other Ambulatory Visit: Payer: Self-pay

## 2017-05-30 ENCOUNTER — Ambulatory Visit (INDEPENDENT_AMBULATORY_CARE_PROVIDER_SITE_OTHER): Payer: BLUE CROSS/BLUE SHIELD | Admitting: Internal Medicine

## 2017-05-30 ENCOUNTER — Encounter: Payer: Self-pay | Admitting: Internal Medicine

## 2017-05-30 VITALS — BP 180/98 | HR 99 | Temp 97.0°F | Ht 65.0 in | Wt 264.4 lb

## 2017-05-30 DIAGNOSIS — D508 Other iron deficiency anemias: Secondary | ICD-10-CM | POA: Diagnosis not present

## 2017-05-30 DIAGNOSIS — Z860100 Personal history of colon polyps, unspecified: Secondary | ICD-10-CM

## 2017-05-30 MED ORDER — PEG 3350-KCL-NA BICARB-NACL 420 G PO SOLR: 4000.0000 mL | ORAL | 0 refills | Status: DC

## 2017-05-30 NOTE — Progress Notes (Signed)
Primary Care Physician:  Caren Macadam, MD Primary Gastroenterologist:  Dr. Gala Romney  Pre-Procedure History & Physical: HPI:  Ann Roberson is a 67 y.o. female here for follow-up of colonic polyps and evaluate anemia. Patient noted to have a mild nonspecific anemia recently. Anemia profile sent to [facility]. Do not have those results were reviewed. Patient has a history of colonic adenomas and is due for surveillance colonoscopy now. Patient denies hematochezia, melena, constipation or diarrhea. Denies abdominal pain or any upper GI tract symptoms . GERD well-controlled on Protonix 40 mg daily. Denies dysphagia.  Past Medical History:  Diagnosis Date  . Diabetes mellitus   . Hyperlipidemia   . Hypertension   . Sarcoidosis     Past Surgical History:  Procedure Laterality Date  . CESAREAN SECTION    . CHOLECYSTECTOMY    . TUBAL LIGATION      Prior to Admission medications   Medication Sig Start Date End Date Taking? Authorizing Provider  acetaminophen (TYLENOL) 500 MG tablet Take 500 mg by mouth every 6 (six) hours as needed.   Yes [provider]  aspirin EC 81 MG tablet Take 81 mg by mouth daily.   Yes [provider]  diphenhydrAMINE (BENADRYL ALLERGY) 25 MG tablet Take 25 mg by mouth every 6 (six) hours as needed.   Yes [provider]  famotidine (PEPCID) 20 MG tablet Take 1 tablet (20 mg total) by mouth at bedtime. 05/02/17  Yes Tanda Rockers, MD  gabapentin (NEURONTIN) 300 MG capsule Take 2 capsules (600 mg total) by mouth 3 (three) times daily. 05/04/17  Yes Hagler, Apolonio Schneiders, MD  hydrochlorothiazide (HYDRODIURIL) 25 MG tablet Take 25 mg by mouth daily.  07/27/16  Yes [provider]  insulin glargine (LANTUS) 100 UNIT/ML injection Inject 55 Units into the skin at bedtime.     Yes [provider]  losartan (COZAAR) 50 MG tablet TAKE 1 TABLET BY MOUTH ONCE DAILY 05/04/17  Yes Hagler, Apolonio Schneiders, MD  metFORMIN (GLUCOPHAGE) 1000 MG tablet Take  1,000 mg by mouth 2 (two) times daily.     Yes [provider]  pantoprazole (PROTONIX) 40 MG tablet Take 1 tablet (40 mg total) by mouth daily. 05/02/17  Yes Tanda Rockers, MD  POTASSIUM PO Take 1 tablet by mouth daily. ? strength   Yes [provider]  simvastatin (ZOCOR) 20 MG tablet Take 20 mg by mouth every evening.   Yes [provider]  diclofenac sodium (VOLTAREN) 1 % GEL Apply 2 g topically 3 (three) times daily as needed. Patient not taking: Reported on 05/30/2017 05/04/17   Caren Macadam, MD    Allergies as of 05/30/2017 - Review Complete 05/30/2017  Allergen Reaction Noted  . Erythromycin Nausea And Vomiting 01/29/2011    Family History  Problem Relation Age of Onset  . Asthma Brother   . Cancer Mother   . Cancer Father   . Cancer Brother   . Diabetes Brother   . Allergic rhinitis Neg Hx   . Angioedema Neg Hx   . Atopy Neg Hx   . Eczema Neg Hx   . Immunodeficiency Neg Hx   . Urticaria Neg Hx     Social History   Socioeconomic History  . Marital status: Single    Spouse name: Not on file  . Number of children: Not on file  . Years of education: Not on file  . Highest education level: Not on file  Social Needs  . Financial  resource strain: Not on file  . Food insecurity - worry: Not on file  . Food insecurity - inability: Not on file  . Transportation needs - medical: Not on file  . Transportation needs - non-medical: Not on file  Occupational History  . Not on file  Tobacco Use  . Smoking status: Never Smoker  . Smokeless tobacco: Never Used  Substance and Sexual Activity  . Alcohol use: No  . Drug use: No  . Sexual activity: Not Currently    Birth control/protection: Post-menopausal  Other Topics Concern  . Not on file  Social History Narrative   Lives in [place], [place]. 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    Review of Systems: See HPI, otherwise negative ROS  Physical Exam: BP  (!) 180/98   Pulse 99   Temp (!) 97 F (36.1 C) (Oral)   Ht 5\' 5"  (1.651 m)   Wt 264 lb 6.4 oz (119.9 kg)   BMI 44.00 kg/m  General:   Alert,   pleasant and cooperative in NAD Neck:  Supple; no masses or thyromegaly. No significant cervical adenopathy. Lungs:  Clear throughout to auscultation.   No wheezes, crackles, or rhonchi. No acute distress. Heart:  Regular rate and rhythm; no murmurs, clicks, rubs,  or gallops. Abdomen: Non-distended, normal bowel sounds.  Soft and nontender without appreciable mass or hepatosplenomegaly.  Pulses:  Normal pulses noted. Extremities:  Without clubbing or edema.  Impression: Pleasant 67 year old lady with a history of colonic adenoma; Due for surveillance colonoscopy. Recently noted to be anemic. Further studies performed but not available at this time ([facility]).  No obvious GI bleeding. GERD well controlled. No suspicious upper GI tract symptoms.   Recommendations: We'll obtain labs review as they become available. Either way, patient needs to have a colonoscopy at this time at least for surveillance purposes. I have offered the posterior surveillance colonoscopy.  The risks, benefits, limitations, alternatives and imponderables have been reviewed with the patient. Questions have been answered. All parties are agreeable.  Further recommendations to follow.    Notice: This dictation was prepared with Dragon dictation along with smaller phrase technology. Any transcriptional errors that result from this process are unintentional and may not be corrected upon review.

## 2017-05-30 NOTE — Patient Instructions (Addendum)
Schedule surveillance colonoscopy - hx if colon polyps - propofol  Need anemia blood work results done at [facility]?  Hold night time dose of Metformin and morning dose - day of procedure.  Decrease insulin to 35 units 2 nights before TCS and 30 night before TCS.  Further recommendations to follow

## 2017-05-31 ENCOUNTER — Telehealth: Payer: Self-pay

## 2017-05-31 NOTE — Telephone Encounter (Signed)
Called and informed pt of pre-op appt 06/30/17 at 11:00am. She said she would be at work. Gave her phone number to Endo scheduler for her to reschedule pre-op

## 2017-05-31 NOTE — Telephone Encounter (Signed)
Lmom, waiting on a return call to discuss labwork needed.

## 2017-06-01 NOTE — Telephone Encounter (Signed)
Checked into labwork and the only lab work that was done on pt was for the date 04/24/17. Pt didn't go back and get labs done on 05/04/17, which the orders were put in by pt pcp.   Spoke with pt and she can go get labs done prior to her colonoscopy. It may take 1 week or so, pt said she will have to wait until she gets paid.

## 2017-06-14 ENCOUNTER — Telehealth: Payer: Self-pay | Admitting: Family Medicine

## 2017-06-14 ENCOUNTER — Other Ambulatory Visit: Payer: Self-pay

## 2017-06-14 ENCOUNTER — Encounter: Payer: Self-pay | Admitting: Family Medicine

## 2017-06-14 ENCOUNTER — Ambulatory Visit (INDEPENDENT_AMBULATORY_CARE_PROVIDER_SITE_OTHER): Payer: BLUE CROSS/BLUE SHIELD | Admitting: Family Medicine

## 2017-06-14 VITALS — BP 136/88 | HR 67 | Temp 98.1°F | Resp 16 | Ht 64.0 in | Wt 264.5 lb

## 2017-06-14 DIAGNOSIS — I10 Essential (primary) hypertension: Secondary | ICD-10-CM | POA: Diagnosis not present

## 2017-06-14 DIAGNOSIS — E1169 Type 2 diabetes mellitus with other specified complication: Secondary | ICD-10-CM

## 2017-06-14 DIAGNOSIS — M170 Bilateral primary osteoarthritis of knee: Secondary | ICD-10-CM

## 2017-06-14 DIAGNOSIS — D638 Anemia in other chronic diseases classified elsewhere: Secondary | ICD-10-CM

## 2017-06-14 DIAGNOSIS — E785 Hyperlipidemia, unspecified: Secondary | ICD-10-CM

## 2017-06-14 LAB — HEMOGLOBINOPATHY EVALUATION
Fetal Hemoglobin Testing: <1 % (ref 0.0–1.9)
HCT: 34.6 % — ABNORMAL LOW (ref 35.0–45.0)
Hemoglobin A2 - HGBRFX: 1.8 % (ref 1.8–3.5)
Hemoglobin: 10.9 g/dL — ABNORMAL LOW (ref 11.7–15.5)
Hgb A: 97.2 % (ref >96.0)
MCH: 27.5 pg (ref 27.0–33.0)
MCV: 87.2 fL (ref 80.0–100.0)
RBC: 3.97 10*6/uL (ref 3.80–5.10)
RDW: 12.4 % (ref 11.0–15.0)

## 2017-06-14 LAB — IRON,TIBC AND FERRITIN PANEL
%SAT: 22 % (calc) (ref 11–50)
Ferritin: 208 ng/mL (ref 20–288)
Iron: 57 ug/dL (ref 45–160)
TIBC: 265 mcg/dL (calc) (ref 250–450)

## 2017-06-14 LAB — BASIC METABOLIC PANEL
BUN: 17 mg/dL (ref 7–25)
CO2: 29 mmol/L (ref 20–32)
Calcium: 9.2 mg/dL (ref 8.6–10.4)
Chloride: 106 mmol/L (ref 98–110)
Creat: 0.94 mg/dL (ref 0.50–0.99)
Glucose, Bld: 153 mg/dL — ABNORMAL HIGH (ref 65–99)
Potassium: 4.5 mmol/L (ref 3.5–5.3)
Sodium: 142 mmol/L (ref 135–146)

## 2017-06-14 MED ORDER — "PEN NEEDLES 5/16"" 31G X 8 MM MISC": 1.0000 | 0 refills | Status: DC

## 2017-06-14 MED ORDER — LOSARTAN POTASSIUM 100 MG PO TABS: 100.0000 mg | ORAL_TABLET | Freq: Every day | ORAL | 1 refills | Status: DC

## 2017-06-14 MED ORDER — OLMESARTAN MEDOXOMIL 20 MG PO TABS: 20.0000 mg | ORAL_TABLET | Freq: Every day | ORAL | 0 refills | Status: DC

## 2017-06-14 NOTE — Telephone Encounter (Signed)
NO, just the losartan 100 mg a day. I cancelled the benicar/olmesartan. Please let them know. Gwen Her. Mannie Stabile, MD

## 2017-06-14 NOTE — Telephone Encounter (Signed)
Is [facility] to fill both losartan and olmesartan?

## 2017-06-14 NOTE — Progress Notes (Signed)
Patient ID: Ann Roberson, female    DOB: 12-25-1950, 67 y.o.   MRN: [id number]  Chief Complaint  Patient presents with  . Follow-up  . Medication Management  . Hypertension  . Knee Pain  . Cough    Allergies Erythromycin  Subjective:   Ann Roberson is a 67 y.o. female who presents to [facility] today.  HPI Ann Roberson presents for follow-up today.  She reports she is still trying to lose weight so that she can get knee replacement surgery.  She reports that she went to get the Voltaren gel but could not afford it because it was over $50.  She has not been taking the diclofenac but on a very rare occasion.  She reports that she had a fall several weeks ago.  She reports that she was outside and it was raining and very wet.  She grabbed hold of the mailbox to stabilize herself and the whole mailbox came out of the ground and she fell backward into the road.  She did not hit her head.  She reports that her elbow was initially sore and she had some skin abrasions.  There is have subsequently healed up.  She has been seen by in pulmonology.  He thought some of her cough symptoms were related to reflux and not sarcoidosis.  She reports she is taking theDr. Melvyn Novas acid medication.  She does have a follow-up with Dr. Melvyn Novas soon.  She has also seen by Dr. Buford Dresser due to the anemia.  She is due for a colonoscopy and has that scheduled.  She reports that her blood pressure has been running a bit high.  Has been taking it every day.  Denies any chest pain or swelling in her extremities.  Denies any palpitations.  Has not been back to orthopedics for knee pain.  Did previously received joint injections in the past which helped with the pain.  She has not had a joint injection in over a year.  She has never received Synvisc injections.  She is also here to review her anemia lab test.  She does still have anemia which is been present for years.  Her labs indicate that she is not  iron deficient and has a normal hemoglobin phenotype.  She reports that she had been on iron pills for years and years because she was told that she was iron deficient.   Past Medical History:  Diagnosis Date  . Diabetes mellitus   . Hyperlipidemia   . Hypertension   . Sarcoidosis     Past Surgical History:  Procedure Laterality Date  . CESAREAN SECTION    . CHOLECYSTECTOMY    . TUBAL LIGATION      Family History  Problem Relation Age of Onset  . Asthma Brother   . Cancer Mother   . Cancer Father   . Cancer Brother   . Diabetes Brother   . Allergic rhinitis Neg Hx   . Angioedema Neg Hx   . Atopy Neg Hx   . Eczema Neg Hx   . Immunodeficiency Neg Hx   . Urticaria Neg Hx      Social History   Socioeconomic History  . Marital status: Single    Spouse name: Not on file  . Number of children: Not on file  . Years of education: Not on file  . Highest education level: Not on file  Occupational History  . Not on file  Social Needs  . Financial  resource strain: Not on file  . Food insecurity:    Worry: Not on file    Inability: Not on file  . Transportation needs:    Medical: Not on file    Non-medical: Not on file  Tobacco Use  . Smoking status: Never Smoker  . Smokeless tobacco: Never Used  Substance and Sexual Activity  . Alcohol use: No  . Drug use: No  . Sexual activity: Not Currently    Birth control/protection: Post-menopausal  Lifestyle  . Physical activity:    Days per week: Not on file    Minutes per session: Not on file  . Stress: Not on file  Relationships  . Social connections:    Talks on phone: Not on file    Gets together: Not on file    Attends religious service: Not on file    Active member of club or organization: Not on file    Attends meetings of clubs or organizations: Not on file    Relationship status: Not on file  Other Topics Concern  . Not on file  Social History Narrative   Lives in [place], [place]. Works as a Quarry manager. Had an  injury at work where fell and tripped on a phone cord.   Current Outpatient Medications on File Prior to Visit  Medication Sig Dispense Refill  . acetaminophen (TYLENOL) 500 MG tablet Take 500 mg by mouth every 6 (six) hours as needed.    [name] aspirin EC 81 MG tablet Take 81 mg by mouth daily.    . diphenhydrAMINE (BENADRYL ALLERGY) 25 MG tablet Take 25 mg by mouth every 6 (six) hours as needed.    . famotidine (PEPCID) 20 MG tablet Take 1 tablet (20 mg total) by mouth at bedtime. 30 tablet 2  . gabapentin (NEURONTIN) 300 MG capsule Take 2 capsules (600 mg total) by mouth 3 (three) times daily. 180 capsule 3  . hydrochlorothiazide (HYDRODIURIL) 25 MG tablet Take 25 mg by mouth daily.   1  . insulin glargine (LANTUS) 100 UNIT/ML injection Inject 55 Units into the skin at bedtime.      . metFORMIN (GLUCOPHAGE) 1000 MG tablet Take 1,000 mg by mouth 2 (two) times daily.      . pantoprazole (PROTONIX) 40 MG tablet Take 1 tablet (40 mg total) by mouth daily. 30 tablet 2  . polyethylene glycol-electrolytes (TRILYTE) 420 g solution Take 4,000 mLs by mouth as directed. 4000 mL 0  . POTASSIUM PO Take 1 tablet by mouth daily. ? strength    . simvastatin (ZOCOR) 20 MG tablet Take 20 mg by mouth every evening.     No current facility-administered medications on file prior to visit.     Review of Systems  Constitutional: Negative for activity change, appetite change and fever.  HENT: Negative for dental problem, nosebleeds, postnasal drip, rhinorrhea, sore throat and tinnitus.   Eyes: Negative for visual disturbance.  Respiratory: Positive for cough. Negative for chest tightness and shortness of breath.   Cardiovascular: Negative for chest pain, palpitations and leg swelling.  Gastrointestinal: Negative for abdominal pain, constipation, diarrhea, nausea and vomiting.  Genitourinary: Negative for dysuria, frequency and urgency.  Musculoskeletal: Positive for arthralgias and gait problem.       Reports  that it is still difficult to walk secondary to her knee pain.  Is still trying to get weight off so that she can get knee replacement.  No redness in her joints.  Neurological: Negative for dizziness, syncope and light-headedness.  Hematological: Negative for adenopathy.  Psychiatric/Behavioral: Negative for behavioral problems and dysphoric mood. The patient is not nervous/anxious.      Objective:   BP 136/88 (BP Location: Left Arm, Patient Position: Sitting, Cuff Size: Normal)   Pulse 67   Temp 98.1 F (36.7 C) (Temporal)   Resp 16   Ht 5\' 4"  (1.626 m)   Wt 264 lb 8 oz (120 kg)   SpO2 98%   BMI 45.40 kg/m  Blood pressure checked by me today 155/92.  Review of office visits from specialist physicians reveals significantly elevated blood pressure at those visits 2. Physical Exam  Constitutional: She is oriented to person, place, and time. She appears well-developed and well-nourished. No distress.  HENT:  Head: Normocephalic and atraumatic.  Eyes: Pupils are equal, round, and reactive to light.  Neck: Normal range of motion. Neck supple. No thyromegaly present.  Cardiovascular: Normal rate, regular rhythm and normal heart sounds.  Pulmonary/Chest: Effort normal and breath sounds normal. No respiratory distress.  Neurological: She is alert and oriented to person, place, and time. No cranial nerve deficit.  Skin: Skin is warm and dry.  Healed skin abrasion on elbow with scarring and loss of pigmentation.  Psychiatric: She has a normal mood and affect. Her behavior is normal. Thought content normal.  Nursing note and vitals reviewed.    Assessment and Plan  1. HTN, goal below 130/80 Blood pressure uncontrolled.  Increase losartan at this time.  Follow-up in 4 weeks for recheck and plan to check BMP at that time due to increasing dose of medication. Lifestyle modifications discussed with patient including a diet emphasizing vegetables, fruits, and whole grains. Limiting intake of  sodium to less than 2,400 mg per day.  Recommendations discussed include consuming low-fat dairy products, poultry, fish, legumes, non-tropical vegetable oils, and nuts; and limiting intake of sweets, sugar-sweetened beverages, and red meat. Discussed following a plan such as the Dietary Approaches to Stop Hypertension (DASH) diet. Patient to read up on this diet.   - losartan (COZAAR) 100 MG tablet; Take 1 tablet (100 mg total) by mouth daily.  Dispense: 90 tablet; Refill: 1  2. Hyperlipidemia associated with type 2 diabetes mellitus ([place]) LDL check in January reveals LDL is at goal at level being 61.  Continue statin medications as directed.  LFTs checked in January which were within normal limits.  She has not been on aspirin secondary to her anemia.  She was told she could restart her aspirin 81 mg a day to decrease her cardiovascular risk.  3. Morbid obesity due to excess calories ([place]) Continued weight loss was encouraged and discussed today.  She is to continue to make good food choices and decrease her caloric intake to a normal range but not less than 1200 cal a day.  She is to call if she is having any problems with her blood sugars.  Her diabetes is well controlled.  4. Primary osteoarthritis of both knees Did discuss that she could go back and see orthopedics and get steroid injection into the knee or other possible injections to aid with increased movement and pain control in the knee.  We discussed that her lack of mobility does not assist in her weight loss.  She is hesitant to go back to the orthopedic surgeon because she does not believe they will do anything for her until she loses weight.  I have placed a referral in the past and she was encouraged to go to the orthopedic.  We  did discuss that due to her diabetes, hypertension and risk for chronic kidney disease I do not recommend that she be on diclofenac on a daily basis but instead use it as needed for exacerbations of her knee  pain.  She voiced understanding.  5. Anemia in other chronic diseases classified elsewhere Discussed anemia with patient today.  Her anemia has been chronic.  She has a normal hemoglobin phenotype.  She is not iron deficient.  Her ferritin levels are on the high side of normal.  She does not have renal disease.  Her other blood cell lines are within normal limits.  She was encouraged not to use iron supplementation as this would not improve her anemia and put her at increased risk for liver issues related to iron storage.  We will continue to monitor her blood counts several times here to make sure she is not decreasing.  We will continue to monitor and refer to hematology if needed.  6. Type 2 diabetes mellitus with other specified complication, without long-term current use of insulin (H[facility]) Hemoglobin A1c under good control.  Not due for A1c for another several months.  Continue to work on weight loss.  Discussed hypoglycemic risk of cutting her calories too much.  Good food choices recommended.  Keep scheduled visit with Dr. Melvyn Novas in pulmonology and Dr. Buford Dresser in GI.   Return in about 4 weeks (around 07/12/2017). Caren Macadam, MD 06/17/2017

## 2017-06-15 NOTE — Telephone Encounter (Signed)
Pharmacy notified.

## 2017-06-17 DIAGNOSIS — M179 Osteoarthritis of knee, unspecified: Secondary | ICD-10-CM | POA: Insufficient documentation

## 2017-06-17 DIAGNOSIS — E1169 Type 2 diabetes mellitus with other specified complication: Secondary | ICD-10-CM | POA: Insufficient documentation

## 2017-06-17 DIAGNOSIS — M1710 Unilateral primary osteoarthritis, unspecified knee: Secondary | ICD-10-CM | POA: Insufficient documentation

## 2017-06-17 DIAGNOSIS — M1712 Unilateral primary osteoarthritis, left knee: Secondary | ICD-10-CM | POA: Insufficient documentation

## 2017-06-17 DIAGNOSIS — D638 Anemia in other chronic diseases classified elsewhere: Secondary | ICD-10-CM | POA: Insufficient documentation

## 2017-06-19 ENCOUNTER — Telehealth: Payer: Self-pay | Admitting: Family Medicine

## 2017-06-19 NOTE — Telephone Encounter (Signed)
Pt LVM that she needs 37mm instead of 9mm--called into [facility] to [place]

## 2017-06-20 ENCOUNTER — Other Ambulatory Visit: Payer: Self-pay | Admitting: Family Medicine

## 2017-06-20 ENCOUNTER — Ambulatory Visit: Payer: BLUE CROSS/BLUE SHIELD | Admitting: Internal Medicine

## 2017-06-20 ENCOUNTER — Other Ambulatory Visit: Payer: Self-pay

## 2017-06-20 ENCOUNTER — Encounter: Payer: Self-pay | Admitting: Internal Medicine

## 2017-06-20 ENCOUNTER — Ambulatory Visit (INDEPENDENT_AMBULATORY_CARE_PROVIDER_SITE_OTHER): Payer: BLUE CROSS/BLUE SHIELD | Admitting: Internal Medicine

## 2017-06-20 VITALS — BP 140/70 | HR 85 | Ht 64.0 in | Wt 264.0 lb

## 2017-06-20 DIAGNOSIS — R0600 Dyspnea, unspecified: Secondary | ICD-10-CM

## 2017-06-20 DIAGNOSIS — R058 Other specified cough: Secondary | ICD-10-CM

## 2017-06-20 DIAGNOSIS — H6123 Impacted cerumen, bilateral: Secondary | ICD-10-CM

## 2017-06-20 DIAGNOSIS — R0609 Other forms of dyspnea: Secondary | ICD-10-CM | POA: Diagnosis not present

## 2017-06-20 DIAGNOSIS — R05 Cough: Secondary | ICD-10-CM | POA: Diagnosis not present

## 2017-06-20 LAB — PULMONARY FUNCTION TEST
DL/VA % pred: 107 %
DL/VA: 5.15 ml/min/mmHg/L
DLCO cor % pred: 79 %
DLCO cor: 19.4 ml/min/mmHg
DLCO unc % pred: 73 %
DLCO unc: 17.73 ml/min/mmHg
FEF 25-75 Post: 2.37 L/sec
FEF 25-75 Pre: 1.93 L/sec
FEF2575-%Change-Post: 22 %
FEF2575-%Pred-Post: 124 %
FEF2575-%Pred-Pre: 101 %
FEV1-%Change-Post: 4 %
FEV1-%Pred-Post: 96 %
FEV1-%Pred-Pre: 91 %
FEV1-Post: 1.9 L
FEV1-Pre: 1.82 L
FEV1FVC-%Change-Post: 7 %
FEV1FVC-%Pred-Pre: 105 %
FEV6-%Change-Post: -1 %
FEV6-%Pred-Post: 88 %
FEV6-%Pred-Pre: 89 %
FEV6-Post: 2.16 L
FEV6-Pre: 2.18 L
FEV6FVC-%Change-Post: 1 %
FEV6FVC-%Pred-Post: 104 %
FEV6FVC-%Pred-Pre: 102 %
FVC-%Change-Post: -2 %
FVC-%Pred-Post: 85 %
FVC-%Pred-Pre: 87 %
FVC-Post: 2.16 L
FVC-Pre: 2.21 L
Post FEV1/FVC ratio: 88 %
Post FEV6/FVC ratio: 100 %
Pre FEV1/FVC ratio: 82 %
Pre FEV6/FVC Ratio: 99 %
RV % pred: 105 %
RV: 2.19 L
TLC % pred: 91 %
TLC: 4.63 L

## 2017-06-20 MED ORDER — "PEN NEEDLES 3/16"" 31G X 5 MM MISC": 1.0000 | 0 refills | Status: DC

## 2017-06-20 NOTE — Progress Notes (Signed)
PFT done today. 

## 2017-06-20 NOTE — Progress Notes (Signed)
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 Due to recurrent sarcoid.   History of Present Illness  05/01/2017 1st [facility] office visit/ [facility]   Chief Complaint  Patient presents with  . Pulmonary Consult    Referred by Dr. Mannie Stabile for Sarcoid eval. Pt states she was dxed with Sarcoid in 2003 by skin bx. She states she has been wheezing and coughing- comes and goes. Cough is non prod.   sob x one year can still do [employer] leaning on cart / has to use scooter to do wm/ uses [facility] parking/ no 02  No inhalers Sleeps on L side / feels choking immediately when try to lie down  on back  X decades  Stopped lisinopril 10/2015 due to hives/ not cough (as far as she can remember) Cough comes and goes day > noct and non productive ? Worse with voice use assoc sometime with audible wheezing  No better on symbicort rec Pantoprazole (protonix) 40 mg   Take  30-60 min before first meal of the day and Pepcid (famotidine)  20 mg one @  bedtime until return to office - this is the best way to tell whether stomach acid is contributing to your problem.   GERD diet    06/20/2017  f/u ov/[facility] re: uacs   Chief Complaint  Patient presents with  . Follow-up    PFT's done today. Her breathing is unchanged since the last visit and no new co's today.   Dyspnea:  Really more limited by legs Cough: better / some pnds Sleep: on side horizontal SABA use:  N/a    No obvious day to day or daytime variability or assoc excess/ purulent  sputum or mucus plugs or hemoptysis or cp or chest tightness, subjective wheeze or overt sinus or hb symptoms. No unusual exposure hx or h/o childhood pna/ asthma or knowledge of premature birth.  Sleeping ok flat without nocturnal  or early am exacerbation  of respiratory  c/o's or need for noct saba. Also denies any obvious fluctuation of symptoms with weather or environmental changes or other aggravating or alleviating factors except as outlined above   Current Allergies, Complete Past Medical History, Past Surgical History, Family History, and Social History were reviewed in [employer] record.  ROS  The following are not active complaints unless bolded Hoarseness, sore throat, dysphagia, dental problems, itching, sneezing,  nasal congestion or discharge of excess mucus or purulent secretions, ear ache,   fever, chills, sweats, unintended wt loss or wt gain, classically pleuritic or exertional cp,  orthopnea pnd or leg swelling, presyncope, palpitations, abdominal pain, anorexia, nausea, vomiting, diarrhea  or change in bowel habits or change in bladder habits, change in stools or change in urine, dysuria, hematuria,  rash, arthralgias, visual complaints, headache, numbness, weakness or ataxia or problems with walking or coordination,  change in mood/affect or memory.  Current Meds  Medication Sig  . acetaminophen (TYLENOL) 500 MG tablet Take 500 mg by mouth every 6 (six) hours as needed.  [name] aspirin EC 81 MG tablet Take 81 mg by mouth daily.  . diphenhydrAMINE (BENADRYL ALLERGY) 25 MG tablet Take 25 mg by mouth every 6 (six) hours as needed.  . famotidine (PEPCID) 20 MG tablet Take 1 tablet (20 mg total) by mouth at bedtime.  . gabapentin (NEURONTIN) 300 MG capsule Take 2 capsules (600 mg total) by mouth 3 (three) times daily.  . hydrochlorothiazide (HYDRODIURIL) 25 MG tablet Take 25 mg by mouth daily.   . insulin glargine (LANTUS) 100 UNIT/ML injection Inject 55  Units into the skin at bedtime.    [name] losartan (COZAAR) 100 MG tablet Take 1 tablet (100 mg total) by mouth daily.  . metFORMIN (GLUCOPHAGE) 1000 MG tablet Take 1,000 mg by mouth 2 (two) times daily.    . pantoprazole (PROTONIX) 40 MG tablet Take 1 tablet (40 mg total) by mouth daily.  . polyethylene glycol-electrolytes (TRILYTE) 420 g solution Take 4,000 mLs by mouth as directed.  [name] POTASSIUM PO Take 1 tablet by mouth daily. ? strength  . simvastatin (ZOCOR) 20 MG tablet Take 20 mg by mouth every evening.  [name]  ] Insulin Pen Needle (PEN NEEDLES 3/16") 31G X 5 MM MISC 1 each by Does not apply route as directed.               Objective:   Physical Exam  Massively obese/ can't get on exam table walks with cane   06/20/2017       264   05/01/17 261 lb 9.6 oz (118.7 kg)  04/06/17 259 lb (117.5 kg)  08/17/16 258 lb (117 kg)     Vital signs reviewed - Note on arrival 02 sats  96% on RA     HEENT: nl dentition, turbinates bilaterally, and oropharynx.  external ear canals partially obst with wax bilaterally  NECK :  without JVD/Nodes/TM/ nl carotid upstrokes bilaterally   LUNGS: no acc muscle use,  Nl contour chest which is clear to A and P bilaterally without cough on insp or exp maneuvers   CV:  RRR  no s3 or murmur or increase in P2, and no edema   ABD:  Quite obese but soft and nontender with very limited  inspiratory excursion . No bruits or organomegaly appreciated, bowel sounds nl  MS:  Nl gait/ ext warm without deformities, calf tenderness, cyanosis or clubbing No obvious joint restrictions   SKIN: warm and dry without lesions    NEURO:  alert, approp, nl sensorium with  no motor or cerebellar deficits apparent.         trace pitting both ankles      CXR PA and Lateral:   05/01/2017 :    I personally reviewed images and agree with radiology impression as follows:   Several areas of calcification in nonenlarged lymph nodes consistent with prior granulomatous  disease. No edema or consolidation.      Assessment:

## 2017-06-20 NOTE — Patient Instructions (Addendum)
Your only pulmonary problems are all related to your weight   For drainage / throat tickle try take CHLORPHENIRAMINE  4 mg - take one every 4 hours as needed - available over the counter- may cause drowsiness so start with just a bedtime dose or two and see how you tolerate it before trying in daytime    No need for pulmonary medications or follow up  Our NP Tammy can remove the wax if the over counter kit doesn't help you or see your PCP

## 2017-06-20 NOTE — Telephone Encounter (Signed)
Done

## 2017-06-20 NOTE — Telephone Encounter (Signed)
Dr. Roma Kayser patient. Thank you!   -Abby S.

## 2017-06-25 ENCOUNTER — Encounter: Payer: Self-pay | Admitting: Internal Medicine

## 2017-06-25 DIAGNOSIS — H6120 Impacted cerumen, unspecified ear: Secondary | ICD-10-CM | POA: Insufficient documentation

## 2017-06-25 NOTE — Assessment & Plan Note (Signed)
PFTs 06/20/2017  With ERV 52% c/w effects of obesity   Body mass index is 45.32 kg/m.  -  trending up Lab Results  Component Value Date   TSH 1.22 11/17/2015     Contributing to gerd risk/ doe/reviewed the need and the process to achieve and maintain neg calorie balance > defer f/u primary care including intermittently monitoring thyroid status

## 2017-06-25 NOTE — Assessment & Plan Note (Signed)
Max rx for GERD 05/01/2017  - pft's 06/20/2017 s obst  - 1st gen H1 blockers per guidelines  06/20/2017 >>>    I had an extended final summary discussion with the patient reviewing all relevant studies completed to date and  lasting 15 to 20 minutes of a 25 minute visit on the following issues:   1) no evidence of a pulmonary problem here   2) ent f/u prn if not improving on rx for gerd/ pnds   3) Each maintenance medication was reviewed in detail including most importantly the difference between maintenance and as needed and under what circumstances the prns are to be used.  Please see AVS for specific  Instructions which are unique to this visit and I personally typed out  which were reviewed in detail in writing with the patient and a copy provided.

## 2017-06-25 NOTE — Assessment & Plan Note (Signed)
rec otc's / lavage or ENT f/u prn (also for uacs)

## 2017-06-25 NOTE — Assessment & Plan Note (Signed)
Echo 04/12/17  Left ventricle: The cavity size was normal. Wall thickness was   increased in a pattern of mild LVH. Systolic function was normal.   The estimated ejection fraction was in the range of 60% to 65%.   Wall motion was normal; there were no regional wall motion   abnormalities. Doppler parameters are consistent with abnormal   left ventricular relaxation (grade 1 diastolic dysfunction).   Doppler parameters are consistent with high ventricular filling   pressure. - Aortic valve: Trileaflet; mildly thickened, mildly calcified   leaflets. There was mild stenosis. Peak velocity (S): 248 cm/s.   Mean gradient (S): 13 mm Hg.   - Spirometry 05/01/2017  FEV1 1.72 (83%)  Ratio 83 on no rx / no curvature   - PFT's  06/20/2017  FEV1 1.90 (96 % ) ratio 88  p 4 % improvement from saba p 0 prior to study with DLCO  73/79 % corrects to 107  % for alv volume  With ERV 52% (see MO a/p)  No pulmonary f/u needed

## 2017-06-26 ENCOUNTER — Other Ambulatory Visit: Payer: Self-pay | Admitting: Family Medicine

## 2017-06-27 NOTE — Patient Instructions (Signed)
Ann Roberson  06/27/2017     @PREFPERIOPPHARMACY @   Your procedure is scheduled on  07/06/2017   Report to [facility] at  645   A.M.  Call this number if you have problems the morning of surgery:  [phone]   Remember:  Do not eat food or drink liquids after midnight.  Take these medicines the morning of surgery with A SIP OF WATER  Neurontin, losartan, protonix.   Do not wear jewelry, make-up or nail polish.  Do not wear lotions, powders, or perfumes, or deodorant.  Do not shave 48 hours prior to surgery.  Men may shave face and neck.  Do not bring valuables to the hospital.  [facility] is not responsible for any belongings or valuables.  Contacts, dentures or bridgework may not be worn into surgery.  Leave your suitcase in the car.  After surgery it may be brought to your room.  For patients admitted to the hospital, discharge time will be determined by your treatment team.  Patients discharged the day of surgery will not be allowed to drive home.   Name and phone number of your driver:   family Special instructions:  Follow the diet and prep instructions given to you by Dr Roseanne Kaufman office.  Please read over the following fact sheets that you were given. Anesthesia Post-op Instructions and Care and Recovery After Surgery       Colonoscopy, Adult A colonoscopy is an exam to look at the large intestine. It is done to check for problems, such as:  Lumps (tumors).  Growths (polyps).  Swelling (inflammation).  Bleeding.  What happens before the procedure? Eating and drinking Follow instructions from your doctor about eating and drinking. These instructions may include:  A few days before the procedure - follow a low-fiber diet. ? Avoid nuts. ? Avoid seeds. ? Avoid dried fruit. ? Avoid raw fruits. ? Avoid vegetables.  1-3 days before the procedure - follow a clear liquid diet. Avoid liquids that have red or purple dye. Drink only clear  liquids, such as: ? Clear broth or bouillon. ? Black coffee or tea. ? Clear juice. ? Clear soft drinks or sports drinks. ? Gelatin dessert. ? Popsicles.  On the day of the procedure - do not eat or drink anything during the 2 hours before the procedure.  Bowel prep If you were prescribed an oral bowel prep:  Take it as told by your doctor. Starting the day before your procedure, you will need to drink a lot of liquid. The liquid will cause you to poop (have bowel movements) until your poop is almost clear or light green.  If your skin or butt gets irritated from diarrhea, you may: ? Wipe the area with wipes that have medicine in them, such as adult wet wipes with aloe and vitamin E. ? Put something on your skin that soothes the area, such as petroleum jelly.  If you throw up (vomit) while drinking the bowel prep, take a break for up to 60 minutes. Then begin the bowel prep again. If you keep throwing up and you cannot take the bowel prep without throwing up, call your doctor.  General instructions  Ask your doctor about changing or stopping your normal medicines. This is important if you take diabetes medicines or blood thinners.  Plan to have someone take you home from the hospital or clinic. What happens during the procedure?  An IV tube  may be put into one of your veins.  You will be given medicine to help you relax (sedative).  To reduce your risk of infection: ? Your doctors will wash their hands. ? Your anal area will be washed with soap.  You will be asked to lie on your side with your knees bent.  Your doctor will get a long, thin, flexible tube ready. The tube will have a camera and a light on the end.  The tube will be put into your anus.  The tube will be gently put into your large intestine.  Air will be delivered into your large intestine to keep it open. You may feel some pressure or cramping.  The camera will be used to take photos.  A small tissue  sample may be removed from your body to be looked at under a microscope (biopsy). If any possible problems are found, the tissue will be sent to a lab for testing.  If small growths are found, your doctor may remove them and have them checked for cancer.  The tube that was put into your anus will be slowly removed. The procedure may vary among doctors and hospitals. What happens after the procedure?  Your doctor will check on you often until the medicines you were given have worn off.  Do not drive for 24 hours after the procedure.  You may have a small amount of blood in your poop.  You may pass gas.  You may have mild cramps or bloating in your belly (abdomen).  It is up to you to get the results of your procedure. Ask your doctor, or the department performing the procedure, when your results will be ready. This information is not intended to replace advice given to you by your health care provider. Make sure you discuss any questions you have with your health care provider. Document Released: 04/16/2010 Document Revised: 01/13/2016 Document Reviewed: 05/26/2015 Elsevier Interactive Patient Education  2017 [employer]  Colonoscopy, Adult, Care After This sheet gives you information about how to care for yourself after your procedure. Your health care provider may also give you more specific instructions. If you have problems or questions, contact your health care provider. What can I expect after the procedure? After the procedure, it is common to have:  A small amount of blood in your stool for 24 hours after the procedure.  Some gas.  Mild abdominal cramping or bloating.  Follow these instructions at home: General instructions   For the first 24 hours after the procedure: ? Do not drive or use machinery. ? Do not sign important documents. ? Do not drink alcohol. ? Do your regular daily activities at a slower pace than normal. ? Eat soft, easy-to-digest foods. ? Rest  often.  Take over-the-counter or prescription medicines only as told by your health care provider.  It is up to you to get the results of your procedure. Ask your health care provider, or the department performing the procedure, when your results will be ready. Relieving cramping and bloating  Try walking around when you have cramps or feel bloated.  Apply heat to your abdomen as told by your health care provider. Use a heat source that your health care provider recommends, such as a moist heat pack or a heating pad. ? Place a towel between your skin and the heat source. ? Leave the heat on for 20-30 minutes. ? Remove the heat if your skin turns bright red. This is especially important if  you are unable to feel pain, heat, or cold. You may have a greater risk of getting burned. Eating and drinking  Drink enough fluid to keep your urine clear or pale yellow.  Resume your normal diet as instructed by your health care provider. Avoid heavy or fried foods that are hard to digest.  Avoid drinking alcohol for as long as instructed by your health care provider. Contact a health care provider if:  You have blood in your stool 2-3 days after the procedure. Get help right away if:  You have more than a small spotting of blood in your stool.  You pass large blood clots in your stool.  Your abdomen is swollen.  You have nausea or vomiting.  You have a fever.  You have increasing abdominal pain that is not relieved with medicine. This information is not intended to replace advice given to you by your health care provider. Make sure you discuss any questions you have with your health care provider. Document Released: 10/27/2003 Document Revised: 12/07/2015 Document Reviewed: 05/26/2015 Elsevier Interactive Patient Education  2018 [place] Anesthesia is a term that refers to techniques, procedures, and medicines that help a person stay safe and comfortable  during a medical procedure. Monitored anesthesia care, or sedation, is one type of anesthesia. Your anesthesia specialist may recommend sedation if you will be having a procedure that does not require you to be unconscious, such as:  Cataract surgery.  A dental procedure.  A biopsy.  A colonoscopy.  During the procedure, you may receive a medicine to help you relax (sedative). There are three levels of sedation:  Mild sedation. At this level, you may feel awake and relaxed. You will be able to follow directions.  Moderate sedation. At this level, you will be sleepy. You may not remember the procedure.  Deep sedation. At this level, you will be asleep. You will not remember the procedure.  The more medicine you are given, the deeper your level of sedation will be. Depending on how you respond to the procedure, the anesthesia specialist may change your level of sedation or the type of anesthesia to fit your needs. An anesthesia specialist will monitor you closely during the procedure. Let your health care provider know about:  Any allergies you have.  All medicines you are taking, including vitamins, herbs, eye drops, creams, and over-the-counter medicines.  Any use of steroids (by mouth or as a cream).  Any problems you or family members have had with sedatives and anesthetic medicines.  Any blood disorders you have.  Any surgeries you have had.  Any medical conditions you have, such as sleep apnea.  Whether you are pregnant or may be pregnant.  Any use of cigarettes, alcohol, or street drugs. What are the risks? Generally, this is a safe procedure. However, problems may occur, including:  Getting too much medicine (oversedation).  Nausea.  Allergic reaction to medicines.  Trouble breathing. If this happens, a breathing tube may be used to help with breathing. It will be removed when you are awake and breathing on your own.  Heart trouble.  Lung trouble.  Before  the procedure Staying hydrated Follow instructions from your health care provider about hydration, which may include:  Up to 2 hours before the procedure - you may continue to drink clear liquids, such as water, clear fruit juice, black coffee, and plain tea.  Eating and drinking restrictions Follow instructions from your health care provider about eating and  drinking, which may include:  8 hours before the procedure - stop eating heavy meals or foods such as meat, fried foods, or fatty foods.  6 hours before the procedure - stop eating light meals or foods, such as toast or cereal.  6 hours before the procedure - stop drinking milk or drinks that contain milk.  2 hours before the procedure - stop drinking clear liquids.  Medicines Ask your health care provider about:  Changing or stopping your regular medicines. This is especially important if you are taking diabetes medicines or blood thinners.  Taking medicines such as aspirin and ibuprofen. These medicines can thin your blood. Do not take these medicines before your procedure if your health care provider instructs you not to.  Tests and exams  You will have a physical exam.  You may have blood tests done to show: ? How well your kidneys and liver are working. ? How well your blood can clot.  General instructions  Plan to have someone take you home from the hospital or clinic.  If you will be going home right after the procedure, plan to have someone with you for 24 hours.  What happens during the procedure?  Your blood pressure, heart rate, breathing, level of pain and overall condition will be monitored.  An IV tube will be inserted into one of your veins.  Your anesthesia specialist will give you medicines as needed to keep you comfortable during the procedure. This may mean changing the level of sedation.  The procedure will be performed. After the procedure  Your blood pressure, heart rate, breathing rate, and  blood oxygen level will be monitored until the medicines you were given have worn off.  Do not drive for 24 hours if you received a sedative.  You may: ? Feel sleepy, clumsy, or nauseous. ? Feel forgetful about what happened after the procedure. ? Have a sore throat if you had a breathing tube during the procedure. ? Vomit. This information is not intended to replace advice given to you by your health care provider. Make sure you discuss any questions you have with your health care provider. Document Released: 12/08/2004 Document Revised: 08/21/2015 Document Reviewed: 07/05/2015 Elsevier Interactive Patient Education  2018 [place], Care After These instructions provide you with information about caring for yourself after your procedure. Your health care provider may also give you more specific instructions. Your treatment has been planned according to current medical practices, but problems sometimes occur. Call your health care provider if you have any problems or questions after your procedure. What can I expect after the procedure? After your procedure, it is common to:  Feel sleepy for several hours.  Feel clumsy and have poor balance for several hours.  Feel forgetful about what happened after the procedure.  Have poor judgment for several hours.  Feel nauseous or vomit.  Have a sore throat if you had a breathing tube during the procedure.  Follow these instructions at home: For at least 24 hours after the procedure:   Do not: ? Participate in activities in which you could fall or become injured. ? Drive. ? Use heavy machinery. ? Drink alcohol. ? Take sleeping pills or medicines that cause drowsiness. ? Make important decisions or sign legal documents. ? Take care of children on your own.  Rest. Eating and drinking  Follow the diet that is recommended by your health care provider.  If you vomit, drink water, juice, or soup when you  can drink without vomiting.  Make sure you have little or no nausea before eating solid foods. General instructions  Have a responsible adult stay with you until you are awake and alert.  Take over-the-counter and prescription medicines only as told by your health care provider.  If you smoke, do not smoke without supervision.  Keep all follow-up visits as told by your health care provider. This is important. Contact a health care provider if:  You keep feeling nauseous or you keep vomiting.  You feel light-headed.  You develop a rash.  You have a fever. Get help right away if:  You have trouble breathing. This information is not intended to replace advice given to you by your health care provider. Make sure you discuss any questions you have with your health care provider. Document Released: 07/05/2015 Document Revised: 11/04/2015 Document Reviewed: 07/05/2015 Elsevier Interactive Patient Education  [employer].

## 2017-06-30 ENCOUNTER — Other Ambulatory Visit: Payer: Self-pay

## 2017-06-30 ENCOUNTER — Encounter (HOSPITAL_COMMUNITY)
Admission: RE | Admit: 2017-06-30 | Discharge: 2017-06-30 | Disposition: A | Discharge status: Home / Self Care | Payer: BLUE CROSS/BLUE SHIELD | Source: Ambulatory Visit | Attending: Internal Medicine | Admitting: Internal Medicine

## 2017-06-30 ENCOUNTER — Other Ambulatory Visit (HOSPITAL_COMMUNITY): Payer: BLUE CROSS/BLUE SHIELD

## 2017-06-30 ENCOUNTER — Encounter (HOSPITAL_COMMUNITY): Payer: Self-pay

## 2017-06-30 DIAGNOSIS — Z01812 Encounter for preprocedural laboratory examination: Secondary | ICD-10-CM | POA: Diagnosis not present

## 2017-06-30 HISTORY — DX: Other specified postprocedural states: Z98.890

## 2017-06-30 HISTORY — DX: Nausea with vomiting, unspecified: R11.2

## 2017-06-30 HISTORY — DX: Gastro-esophageal reflux disease without esophagitis: K21.9

## 2017-06-30 HISTORY — DX: Polyneuropathy, unspecified: G62.9

## 2017-06-30 HISTORY — DX: Anemia, unspecified: D64.9

## 2017-06-30 HISTORY — DX: Cardiac murmur, unspecified: R01.1

## 2017-06-30 HISTORY — DX: Unspecified osteoarthritis, unspecified site: M19.90

## 2017-06-30 LAB — BASIC METABOLIC PANEL
Anion gap: 12 (ref 5–15)
BUN: 18 mg/dL (ref 6–20)
CO2: 22 mmol/L (ref 22–32)
Calcium: 9.2 mg/dL (ref 8.9–10.3)
Chloride: 105 mmol/L (ref 101–111)
Creatinine, Ser: 0.92 mg/dL (ref 0.44–1.00)
GFR calc Af Amer: >60 mL/min (ref >60)
GFR calc non Af Amer: >60 mL/min (ref >60)
Glucose, Bld: 182 mg/dL — ABNORMAL HIGH (ref 65–99)
Potassium: 4.2 mmol/L (ref 3.5–5.1)
Sodium: 139 mmol/L (ref 135–145)

## 2017-06-30 LAB — CBC
HCT: 36.2 % (ref 36.0–46.0)
Hemoglobin: 11 g/dL — ABNORMAL LOW (ref 12.0–15.0)
MCH: 26.8 pg (ref 26.0–34.0)
MCHC: 30.4 g/dL (ref 30.0–36.0)
MCV: 88.1 fL (ref 78.0–100.0)
Platelets: 277 10*3/uL (ref 150–400)
RBC: 4.11 MIL/uL (ref 3.87–5.11)
RDW: 12.7 % (ref 11.5–15.5)
WBC: 6.3 10*3/uL (ref 4.0–10.5)

## 2017-07-06 ENCOUNTER — Encounter (HOSPITAL_COMMUNITY): Payer: Self-pay | Admitting: *Deleted

## 2017-07-06 ENCOUNTER — Ambulatory Visit (HOSPITAL_COMMUNITY)
Admission: RE | Admit: 2017-07-06 | Discharge: 2017-07-06 | Disposition: A | Discharge status: Home / Self Care | Payer: BLUE CROSS/BLUE SHIELD | Source: Ambulatory Visit | Attending: Internal Medicine | Admitting: Internal Medicine

## 2017-07-06 ENCOUNTER — Ambulatory Visit (HOSPITAL_COMMUNITY): Payer: BLUE CROSS/BLUE SHIELD | Admitting: Anesthesiology

## 2017-07-06 ENCOUNTER — Encounter (HOSPITAL_COMMUNITY)
Admission: RE | Disposition: A | Discharge status: Home / Self Care | Payer: Self-pay | Source: Ambulatory Visit | Attending: Internal Medicine

## 2017-07-06 DIAGNOSIS — D869 Sarcoidosis, unspecified: Secondary | ICD-10-CM | POA: Diagnosis not present

## 2017-07-06 DIAGNOSIS — Z79899 Other long term (current) drug therapy: Secondary | ICD-10-CM | POA: Insufficient documentation

## 2017-07-06 DIAGNOSIS — Z86010 Personal history of colonic polyps: Secondary | ICD-10-CM | POA: Diagnosis not present

## 2017-07-06 DIAGNOSIS — D120 Benign neoplasm of cecum: Secondary | ICD-10-CM | POA: Insufficient documentation

## 2017-07-06 DIAGNOSIS — I10 Essential (primary) hypertension: Secondary | ICD-10-CM | POA: Insufficient documentation

## 2017-07-06 DIAGNOSIS — K5730 Diverticulosis of large intestine without perforation or abscess without bleeding: Secondary | ICD-10-CM | POA: Insufficient documentation

## 2017-07-06 DIAGNOSIS — D122 Benign neoplasm of ascending colon: Secondary | ICD-10-CM | POA: Diagnosis not present

## 2017-07-06 DIAGNOSIS — Z794 Long term (current) use of insulin: Secondary | ICD-10-CM | POA: Diagnosis not present

## 2017-07-06 DIAGNOSIS — E1140 Type 2 diabetes mellitus with diabetic neuropathy, unspecified: Secondary | ICD-10-CM | POA: Insufficient documentation

## 2017-07-06 DIAGNOSIS — Z1211 Encounter for screening for malignant neoplasm of colon: Secondary | ICD-10-CM | POA: Insufficient documentation

## 2017-07-06 DIAGNOSIS — Z7982 Long term (current) use of aspirin: Secondary | ICD-10-CM | POA: Diagnosis not present

## 2017-07-06 DIAGNOSIS — E785 Hyperlipidemia, unspecified: Secondary | ICD-10-CM | POA: Diagnosis not present

## 2017-07-06 DIAGNOSIS — Z9049 Acquired absence of other specified parts of digestive tract: Secondary | ICD-10-CM | POA: Insufficient documentation

## 2017-07-06 DIAGNOSIS — D128 Benign neoplasm of rectum: Secondary | ICD-10-CM | POA: Diagnosis not present

## 2017-07-06 HISTORY — PX: COLONOSCOPY WITH PROPOFOL: SHX5780

## 2017-07-06 HISTORY — PX: POLYPECTOMY: SHX5525

## 2017-07-06 LAB — GLUCOSE, CAPILLARY
Glucose-Capillary: 103 mg/dL — ABNORMAL HIGH (ref 65–99)
Glucose-Capillary: 96 mg/dL (ref 65–99)

## 2017-07-06 SURGERY — COLONOSCOPY WITH PROPOFOL
Anesthesia: Monitor Anesthesia Care

## 2017-07-06 MED ORDER — LACTATED RINGERS IV SOLN: INTRAVENOUS | Status: DC

## 2017-07-06 MED ORDER — LACTATED RINGERS IV SOLN
INTRAVENOUS | Status: DC
  Administered 2017-07-06: 1000 mL via INTRAVENOUS
  Administered 2017-07-06: 09:00:00 via INTRAVENOUS

## 2017-07-06 MED ORDER — SPOT INK MARKER SYRINGE KIT
PACK | SUBMUCOSAL | Status: AC
  Filled 2017-07-06: qty 5

## 2017-07-06 MED ORDER — PROPOFOL 10 MG/ML IV BOLUS
INTRAVENOUS | Status: AC
  Filled 2017-07-06: qty 20

## 2017-07-06 MED ORDER — PROMETHAZINE HCL 25 MG/ML IJ SOLN: 6.2500 mg | INTRAMUSCULAR | Status: DC | PRN

## 2017-07-06 MED ORDER — FENTANYL CITRATE (PF) 100 MCG/2ML IJ SOLN
INTRAMUSCULAR | Status: DC | PRN
  Administered 2017-07-06: 25 ug via INTRAVENOUS
  Administered 2017-07-06: 50 ug via INTRAVENOUS
  Administered 2017-07-06: 25 ug via INTRAVENOUS

## 2017-07-06 MED ORDER — PROPOFOL 500 MG/50ML IV EMUL
INTRAVENOUS | Status: DC | PRN
  Administered 2017-07-06: 100 ug/kg/min via INTRAVENOUS

## 2017-07-06 MED ORDER — CHLORHEXIDINE GLUCONATE CLOTH 2 % EX PADS: 6.0000 | MEDICATED_PAD | Freq: Once | CUTANEOUS | Status: DC

## 2017-07-06 MED ORDER — FENTANYL CITRATE (PF) 100 MCG/2ML IJ SOLN
INTRAMUSCULAR | Status: AC
  Filled 2017-07-06: qty 2

## 2017-07-06 MED ORDER — SPOT INK MARKER SYRINGE KIT
PACK | SUBMUCOSAL | Status: DC | PRN
  Administered 2017-07-06: 4 mL via SUBMUCOSAL

## 2017-07-06 MED ORDER — PROPOFOL 10 MG/ML IV BOLUS
INTRAVENOUS | Status: DC | PRN
  Administered 2017-07-06: 50 mg via INTRAVENOUS

## 2017-07-06 MED ORDER — ONDANSETRON HCL 4 MG/2ML IJ SOLN
INTRAMUSCULAR | Status: AC
  Filled 2017-07-06: qty 2

## 2017-07-06 MED ORDER — PHENYLEPHRINE HCL 10 MG/ML IJ SOLN
INTRAMUSCULAR | Status: DC | PRN
  Administered 2017-07-06: 40 ug via INTRAVENOUS
  Administered 2017-07-06: 80 ug via INTRAVENOUS
  Administered 2017-07-06: 40 ug via INTRAVENOUS
  Administered 2017-07-06 (×5): 80 ug via INTRAVENOUS

## 2017-07-06 MED ORDER — PHENYLEPHRINE 40 MCG/ML (10ML) SYRINGE FOR IV PUSH (FOR BLOOD PRESSURE SUPPORT)
PREFILLED_SYRINGE | INTRAVENOUS | Status: AC
  Filled 2017-07-06: qty 10

## 2017-07-06 MED ORDER — MIDAZOLAM HCL 2 MG/2ML IJ SOLN
INTRAMUSCULAR | Status: AC
  Filled 2017-07-06: qty 2

## 2017-07-06 MED ORDER — MIDAZOLAM HCL 5 MG/5ML IJ SOLN
INTRAMUSCULAR | Status: DC | PRN
  Administered 2017-07-06: 2 mg via INTRAVENOUS

## 2017-07-06 MED ORDER — MEPERIDINE HCL 100 MG/ML IJ SOLN: 6.2500 mg | INTRAMUSCULAR | Status: DC | PRN

## 2017-07-06 MED ORDER — HYDROCODONE-ACETAMINOPHEN 7.5-325 MG PO TABS: 1.0000 | ORAL_TABLET | Freq: Once | ORAL | Status: DC | PRN

## 2017-07-06 MED ORDER — HYDROMORPHONE HCL 1 MG/ML IJ SOLN: 0.2500 mg | INTRAMUSCULAR | Status: DC | PRN

## 2017-07-06 MED ORDER — ONDANSETRON HCL 4 MG/2ML IJ SOLN
INTRAMUSCULAR | Status: DC | PRN
  Administered 2017-07-06: 4 mg via INTRAVENOUS

## 2017-07-06 NOTE — Anesthesia Preprocedure Evaluation (Signed)
Anesthesia Evaluation  Patient identified by MRN, date of birth, ID band Patient awake    Reviewed: Allergy & Precautions, H&P , NPO status , Patient's Chart, lab work & pertinent test results  Airway Mallampati: II  TM Distance: >3 FB Neck ROM: full    Dental no notable dental hx. (+) Poor Dentition   Pulmonary neg pulmonary ROS, shortness of breath and with exertion,    Pulmonary exam normal breath sounds clear to auscultation + decreased breath sounds      Cardiovascular Exercise Tolerance: Good hypertension, On Medications negative cardio ROS  + Valvular Problems/Murmurs MR  Rhythm:regular Rate:Normal     Neuro/Psych negative neurological ROS  negative psych ROS   GI/Hepatic negative GI ROS, Neg liver ROS, GERD  ,  Endo/Other  negative endocrine ROSdiabetes, Type 2Morbid obesity  Renal/GU negative Renal ROS  negative genitourinary   Musculoskeletal   Abdominal   Peds  Hematology negative hematology ROS (+) anemia ,   Anesthesia Other Findings H/o colonic polyps  Reproductive/Obstetrics negative OB ROS                             Anesthesia Physical Anesthesia Plan  ASA: III  Anesthesia Plan: MAC   Post-op Pain Management:    Induction:   PONV Risk Score and Plan:   Airway Management Planned:   Additional Equipment:   Intra-op Plan:   Post-operative Plan:   Informed Consent: I have reviewed the patients History and Physical, chart, labs and discussed the procedure including the risks, benefits and alternatives for the proposed anesthesia with the patient or authorized representative who has indicated his/her understanding and acceptance.   Dental Advisory Given  Plan Discussed with: CRNA  Anesthesia Plan Comments:         Anesthesia Quick Evaluation

## 2017-07-06 NOTE — Anesthesia Postprocedure Evaluation (Signed)
Anesthesia Post Note  Patient: Ann Roberson  Procedure(s) Performed: COLONOSCOPY WITH PROPOFOL (N/A ) POLYPECTOMY  Patient location during evaluation: PACU Anesthesia Type: MAC Level of consciousness: awake, awake and alert, oriented and patient cooperative Pain management: pain level controlled Vital Signs Assessment: post-procedure vital signs reviewed and stable Respiratory status: spontaneous breathing, nonlabored ventilation, respiratory function stable and patient connected to face mask oxygen Cardiovascular status: stable Postop Assessment: no apparent nausea or vomiting Anesthetic complications: no     Last Vitals:  Vitals:   07/06/17 0800 07/06/17 0815  BP: (!) 144/119 139/74  Resp: (!) 25 (!) 25  Temp:    SpO2: 97% 98%    Last Pain:  Vitals:   07/06/17 0736  TempSrc: Oral  PainSc: 0-No pain                 CARVER,ALISON L

## 2017-07-06 NOTE — Discharge Instructions (Signed)
PATIENT INSTRUCTIONS POST-ANESTHESIA  IMMEDIATELY FOLLOWING SURGERY:  Do not drive or operate machinery for the first twenty four hours after surgery.  Do not make any important decisions for twenty four hours after surgery or while taking narcotic pain medications or sedatives.  If you develop intractable nausea and vomiting or a severe headache please notify your doctor immediately.  FOLLOW-UP:  Please make an appointment with your surgeon as instructed. You do not need to follow up with anesthesia unless specifically instructed to do so.  WOUND CARE INSTRUCTIONS (if applicable):  Keep a dry clean dressing on the anesthesia/puncture wound site if there is drainage.  Once the wound has quit draining you may leave it open to air.  Generally you should leave the bandage intact for twenty four hours unless there is drainage.  If the epidural site drains for more than 36-48 hours please call the anesthesia department.  QUESTIONS?:  Please feel free to call your physician or the hospital operator if you have any questions, and they will be happy to assist you.       Colonoscopy Discharge Instructions  Read the instructions outlined below and refer to this sheet in the next few weeks. These discharge instructions provide you with general information on caring for yourself after you leave the hospital. Your doctor may also give you specific instructions. While your treatment has been planned according to the most current medical practices available, unavoidable complications occasionally occur. If you have any problems or questions after discharge, call Dr. Gala Romney at [phone]. ACTIVITY  You may resume your regular activity, but move at a slower pace for the next 24 hours.   Take frequent rest periods for the next 24 hours.   Walking will help get rid of the air and reduce the bloated feeling in your belly (abdomen).   No driving for 24 hours (because of the medicine (anesthesia) used during the  test).    Do not sign any important legal documents or operate any machinery for 24 hours (because of the anesthesia used during the test).  NUTRITION  Drink plenty of fluids.   You may resume your normal diet as instructed by your doctor.   Begin with a light meal and progress to your normal diet. Heavy or fried foods are harder to digest and may make you feel sick to your stomach (nauseated).   Avoid alcoholic beverages for 24 hours or as instructed.  MEDICATIONS  You may resume your normal medications unless your doctor tells you otherwise.  WHAT YOU CAN EXPECT TODAY  Some feelings of bloating in the abdomen.   Passage of more gas than usual.   Spotting of blood in your stool or on the toilet paper.  IF YOU HAD POLYPS REMOVED DURING THE COLONOSCOPY:  No aspirin products for 7 days or as instructed.   No alcohol for 7 days or as instructed.   Eat a soft diet for the next 24 hours.  FINDING OUT THE RESULTS OF YOUR TEST Not all test results are available during your visit. If your test results are not back during the visit, make an appointment with your caregiver to find out the results. Do not assume everything is normal if you have not heard from your caregiver or the medical facility. It is important for you to follow up on all of your test results.  SEEK IMMEDIATE MEDICAL ATTENTION IF:  You have more than a spotting of blood in your stool.   Your belly is swollen (  abdominal distention).   You are nauseated or vomiting.   You have a temperature over 101.   You have abdominal pain or discomfort that is severe or gets worse throughout the day.    Diverticulosis and colon polyp information provided  He had multiple polyps. The largest could not be totally removed.  Office visit with [place] in 6 months  Further recommendations to follow pending review of pathology report   Colon Polyps Polyps are tissue growths inside the body. Polyps can grow in many places,  including the large intestine (colon). A polyp may be a round bump or a mushroom-shaped growth. You could have one polyp or several. Most colon polyps are noncancerous (benign). However, some colon polyps can become cancerous over time. What are the causes? The exact cause of colon polyps is not known. What increases the risk? This condition is more likely to develop in people who:  Have a family history of colon cancer or colon polyps.  Are older than 21 or older than 45 if they are African American.  Have inflammatory bowel disease, such as ulcerative colitis or Crohn disease.  Are overweight.  Smoke cigarettes.  Do not get enough exercise.  Drink too much alcohol.  Eat a diet that is: ? High in fat and red meat. ? Low in fiber.  Had childhood cancer that was treated with abdominal radiation.  What are the signs or symptoms? Most polyps do not cause symptoms. If you have symptoms, they may include:  Blood coming from your rectum when having a bowel movement.  Blood in your stool.The stool may look dark red or black.  A change in bowel habits, such as constipation or diarrhea.  How is this diagnosed? This condition is diagnosed with a colonoscopy. This is a procedure that uses a lighted, flexible scope to look at the inside of your colon. How is this treated? Treatment for this condition involves removing any polyps that are found. Those polyps will then be tested for cancer. If cancer is found, your health care provider will talk to you about options for colon cancer treatment. Follow these instructions at home: Diet  Eat plenty of fiber, such as fruits, vegetables, and whole grains.  Eat foods that are high in calcium and vitamin D, such as milk, cheese, yogurt, eggs, liver, fish, and broccoli.  Limit foods high in fat, red meats, and processed meats, such as hot dogs, sausage, bacon, and lunch meats.  Maintain a healthy weight, or lose weight if recommended by  your health care provider. General instructions  Do not smoke cigarettes.  Do not drink alcohol excessively.  Keep all follow-up visits as told by your health care provider. This is important. This includes keeping regularly scheduled colonoscopies. Talk to your health care provider about when you need a colonoscopy.  Exercise every day or as told by your health care provider. Contact a health care provider if:  You have new or worsening bleeding during a bowel movement.  You have new or increased blood in your stool.  You have a change in bowel habits.  You unexpectedly lose weight. This information is not intended to replace advice given to you by your health care provider. Make sure you discuss any questions you have with your health care provider. Document Released: 12/09/2003 Document Revised: 08/20/2015 Document Reviewed: 02/02/2015 Elsevier Interactive Patient Education  2018 [employer].  Diverticulosis Diverticulosis is a condition that develops when small pouches (diverticula) form in the wall of the large  intestine (colon). The colon is where water is absorbed and stool is formed. The pouches form when the inside layer of the colon pushes through weak spots in the outer layers of the colon. You may have a few pouches or many of them. What are the causes? The cause of this condition is not known. What increases the risk? The following factors may make you more likely to develop this condition:  Being older than age 78. Your risk for this condition increases with age. Diverticulosis is rare among people younger than age 64. By age 61, many people have it.  Eating a low-fiber diet.  Having frequent constipation.  Being overweight.  Not getting enough exercise.  Smoking.  Taking over-the-counter pain medicines, like aspirin and ibuprofen.  Having a family history of diverticulosis.  What are the signs or symptoms? In most people, there are no symptoms of this  condition. If you do have symptoms, they may include:  Bloating.  Cramps in the abdomen.  Constipation or diarrhea.  Pain in the lower left side of the abdomen.  How is this diagnosed? This condition is most often diagnosed during an exam for other colon problems. Because diverticulosis usually has no symptoms, it often cannot be diagnosed independently. This condition may be diagnosed by:  Using a flexible scope to examine the colon (colonoscopy).  Taking an X-ray of the colon after dye has been put into the colon (barium enema).  Doing a CT scan.  How is this treated? You may not need treatment for this condition if you have never developed an infection related to diverticulosis. If you have had an infection before, treatment may include:  Eating a high-fiber diet. This may include eating more fruits, vegetables, and grains.  Taking a fiber supplement.  Taking a live bacteria supplement (probiotic).  Taking medicine to relax your colon.  Taking antibiotic medicines.  Follow these instructions at home:  Drink 6-8 glasses of water or more each day to prevent constipation.  Try not to strain when you have a bowel movement.  If you have had an infection before: ? Eat more fiber as directed by your health care provider or your diet and nutrition specialist (dietitian). ? Take a fiber supplement or probiotic, if your health care provider approves.  Take over-the-counter and prescription medicines only as told by your health care provider.  If you were prescribed an antibiotic, take it as told by your health care provider. Do not stop taking the antibiotic even if you start to feel better.  Keep all follow-up visits as told by your health care provider. This is important. Contact a health care provider if:  You have pain in your abdomen.  You have bloating.  You have cramps.  You have not had a bowel movement in 3 days. Get help right away if:  Your pain gets  worse.  Your bloating becomes very bad.  You have a fever or chills, and your symptoms suddenly get worse.  You vomit.  You have bowel movements that are bloody or black.  You have bleeding from your rectum. Summary  Diverticulosis is a condition that develops when small pouches (diverticula) form in the wall of the large intestine (colon).  You may have a few pouches or many of them.  This condition is most often diagnosed during an exam for other colon problems.  If you have had an infection related to diverticulosis, treatment may include increasing the fiber in your diet, taking supplements, or taking  medicines. This information is not intended to replace advice given to you by your health care provider. Make sure you discuss any questions you have with your health care provider. Document Released: 12/10/2003 Document Revised: 02/01/2016 Document Reviewed: 02/01/2016 Elsevier Interactive Patient Education  2017 [employer].

## 2017-07-06 NOTE — Op Note (Addendum)
[facility] Patient Name: Ann Roberson Procedure Date: 07/06/2017 8:09 AM MRN: [id number] Date of Birth: 1951-03-10 Attending MD: Norvel Richards , MD CSN: [id number] Age: 67 Admit Type: Outpatient Procedure:                Colonoscopy Indications:              High risk colon cancer surveillance: Personal                            history of colonic polyps Providers:                Norvel Richards, MD, Janeece Riggers, RN, Aram Candela Referring MD:              Medicines:                Propofol per Anesthesia Complications:            No immediate complications. Estimated Blood Loss:     Estimated blood loss: none. Procedure:                After obtaining informed consent, the colonoscope                            was passed under direct vision. Throughout the                            procedure, the patient's blood pressure, pulse, and                            oxygen saturations were monitored continuously. The                            EC-[id number] ([id number]) scope was introduced through                            the and advanced to the the cecum, identified by                            appendiceal orifice and ileocecal valve. Scope In: 8:31:30 AM Scope Out: 9:22:36 AM Scope Withdrawal Time: 0 hours 38 minutes 47 seconds  Total Procedure Duration: 0 hours 51 minutes 6 seconds  Findings:      The perianal and digital rectal examinations were normal.      Scattered small-mouthed diverticula were found in the sigmoid colon and       descending colon.      Five semi-pedunculated polyps were found in the ascending colon and       cecum. The polyps were 8 to 16 mm in size. These polyps were removed       with a hot snare. Resection and retrieval were incomplet. largest polyp,       16 x 10 mm's fold in the mid ascending segment appeared to be a serrated       type, Extreme respiratory excursions. On a flexure. Very hard approach.    Piecemeal resection performed. Elloview injected to lift  it away from       the colon wall; unable to resect completely largely due to movement.       polyp site tatood      The exam was otherwise without abnormality on direct and retroflexion       views. Impression:               - Diverticulosis in the sigmoid colon and in the                            descending colon.                           - Five 8 to 16 mm polyps in the ascending colon and                            in the cecum, removed with a hot snare. largest                            polyp could not be removed completely?status post                            inking..                           - The examination was otherwise normal on direct                            and retroflexion views. Moderate Sedation:      Moderate (conscious) sedation was personally administered by an       anesthesia professional. The following parameters were monitored: oxygen       saturation, heart rate, blood pressure, respiratory rate, EKG, adequacy       of pulmonary ventilation, and response to care. Total physician       intraservice time was 55 minutes. Recommendation:           - Patient has a contact number available for                            emergencies. The signs and symptoms of potential                            delayed complications were discussed with the                            patient. Return to normal activities tomorrow.                            Written discharge instructions were provided to the                            patient.                           - Resume previous diet.                           -  Continue present medications.                           - Repeat colonoscopy date to be determined after                            pending pathology results are reviewed for                            surveillance after piecemeal polypectomy.                           - Repeat colonoscopy after studies  are complete for                            surveillance.                           - Return to GI office in 6 months. Procedure Code(s):        --- Professional ---                           [phone], Colonoscopy, flexible; with removal of                            tumor(s), polyp(s), or other lesion(s) by snare                            technique Diagnosis Code(s):        --- Professional ---                           Z86.010, Personal history of colonic polyps                           D12.2, Benign neoplasm of ascending colon                           D12.0, Benign neoplasm of cecum                           K57.30, Diverticulosis of large intestine without                            perforation or abscess without bleeding CPT copyright 2017 American Medical Association. All rights reserved. The codes documented in this report are preliminary and upon coder review may  be revised to meet current compliance requirements. Cristopher Estimable. Rourk, MD Norvel Richards, MD 07/06/2017 9:31:53 AM This report has been signed electronically. Number of Addenda: 1 Addendum Number: 1   Addendum Date: 07/08/2017 11:32:56 AM      disregard ? in text Cristopher Estimable. Rourk, MD Norvel Richards, MD 07/08/2017 11:33:48 AM This report has been signed electronically.

## 2017-07-06 NOTE — H&P (Signed)
@LOGO @   Primary Care Physician:  Caren Macadam, MD Primary Gastroenterologist:  Dr. Gala Romney  Pre-Procedure History & Physical: HPI:  Ann Roberson is a 67 y.o. female here for surveillance colonoscopy; history of colonic adenoma.  Past Medical History:  Diagnosis Date  . Anemia   . Arthritis   . Diabetes mellitus   . GERD (gastroesophageal reflux disease)   . Heart murmur   . Hyperlipidemia   . Hypertension   . Neuropathy   . PONV (postoperative nausea and vomiting)   . Sarcoidosis     Past Surgical History:  Procedure Laterality Date  . CESAREAN SECTION    . CHOLECYSTECTOMY    . MASTECTOMY, PARTIAL Left    benign  . TUBAL LIGATION      Prior to Admission medications   Medication Sig Start Date End Date Taking? Authorizing Provider  acetaminophen (TYLENOL) 650 MG CR tablet Take 1,300 mg by mouth 2 (two) times daily.   Yes [provider]  aspirin EC 81 MG tablet Take 81 mg by mouth daily.   Yes [provider]  Cyanocobalamin (VITAMIN B-12 PO) Take 1 tablet by mouth daily.   Yes [provider]  diphenhydrAMINE (BENADRYL ALLERGY) 25 MG tablet Take 25 mg by mouth every 6 (six) hours as needed for allergies.    Yes [provider]  gabapentin (NEURONTIN) 300 MG capsule Take 2 capsules (600 mg total) by mouth 3 (three) times daily. 05/04/17  Yes Hagler, Apolonio Schneiders, MD  hydrochlorothiazide (HYDRODIURIL) 25 MG tablet Take 25 mg by mouth daily.  07/27/16  Yes [provider]  insulin glargine (LANTUS) 100 UNIT/ML injection Inject 55 Units into the skin at bedtime.     Yes [provider]  losartan (COZAAR) 100 MG tablet Take 1 tablet (100 mg total) by mouth daily. 06/14/17  Yes Hagler, Apolonio Schneiders, MD  metFORMIN (GLUCOPHAGE) 1000 MG tablet TAKE 1 TABLET BY MOUTH TWICE DAILY Patient taking differently: TAKE 1000 MG BY MOUTH TWICE DAILY 06/26/17  Yes Fayrene Helper, MD  Misc Natural Products (ADVANCED JOINT RELIEF PO) Take 1 tablet  by mouth daily.   Yes [provider]  pantoprazole (PROTONIX) 40 MG tablet Take 1 tablet (40 mg total) by mouth daily. 05/02/17  Yes Tanda Rockers, MD  polyethylene glycol-electrolytes (TRILYTE) 420 g solution Take 4,000 mLs by mouth as directed. 05/30/17  Yes Caston Coopersmith, Cristopher Estimable, MD  potassium chloride SA (K-DUR,KLOR-CON) 20 MEQ tablet Take 20 mEq by mouth daily.   Yes [provider]  simvastatin (ZOCOR) 20 MG tablet Take 20 mg by mouth at bedtime.    Yes [provider]  B-D UF III MINI PEN NEEDLES 31G X 5 MM MISC USE WITH LANTUS PEN Patient not taking: Reported on 06/26/2017 06/20/17   Caren Macadam, MD  famotidine (PEPCID) 20 MG tablet Take 1 tablet (20 mg total) by mouth at bedtime. Patient not taking: Reported on 06/26/2017 05/02/17   Tanda Rockers, MD  Insulin Pen Needle (PEN NEEDLES 3/16") 31G X 5 MM MISC 1 each by Does not apply route as directed. Patient not taking: Reported on 06/26/2017 06/20/17   Caren Macadam, MD    Allergies as of 05/30/2017 - Review Complete 05/30/2017  Allergen Reaction Noted  . Erythromycin Nausea And Vomiting 01/29/2011    Family History  Problem Relation Age of Onset  . Asthma Brother   . Cancer Mother   . Cancer Father   . Cancer Brother   . Diabetes Brother   .  Allergic rhinitis Neg Hx   . Angioedema Neg Hx   . Atopy Neg Hx   . Eczema Neg Hx   . Immunodeficiency Neg Hx   . Urticaria Neg Hx     Social History   Socioeconomic History  . Marital status: Single    Spouse name: Not on file  . Number of children: Not on file  . Years of education: Not on file  . Highest education level: Not on file  Occupational History  . Not on file  Social Needs  . Financial resource strain: Not on file  . Food insecurity:    Worry: Not on file    Inability: Not on file  . Transportation needs:    Medical: Not on file    Non-medical: Not on file  Tobacco Use  . Smoking status: Never Smoker  . Smokeless tobacco: Never Used   Substance and Sexual Activity  . Alcohol use: No  . Drug use: No  . Sexual activity: Not Currently    Birth control/protection: Post-menopausal  Lifestyle  . Physical activity:    Days per week: Not on file    Minutes per session: Not on file  . Stress: Not on file  Relationships  . Social connections:    Talks on phone: Not on file    Gets together: Not on file    Attends religious service: Not on file    Active member of club or organization: Not on file    Attends meetings of clubs or organizations: Not on file    Relationship status: Not on file  . Intimate partner violence:    Fear of current or ex partner: Not on file    Emotionally abused: Not on file    Physically abused: Not on file    Forced sexual activity: Not on file  Other Topics Concern  . Not on file  Social History Narrative   Lives in [place], [place]. 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    Review of Systems: See HPI, otherwise negative ROS  Physical Exam: Temp (!) 97.5 F (36.4 C) (Oral)   SpO2 100%  General:   Alert,  Well-developed, well-nourished, pleasant and cooperative in NAD Neck:  Supple; no masses or thyromegaly. No significant cervical adenopathy. Lungs:  Clear throughout to auscultation.   No wheezes, crackles, or rhonchi. No acute distress. Heart:  Regular rate and rhythm; no murmurs, clicks, rubs,  or gallops. Abdomen: Non-distended, normal bowel sounds.  Soft and nontender without appreciable mass or hepatosplenomegaly.  Pulses:  Normal pulses noted. Extremities:  Without clubbing or edema.  Impression:    67 year old lady with a history of colonic adenoma; here for surveillance examination.  Recommendations:  I have offered the patient a surveillance colonoscopy today.  The risks, benefits, limitations, alternatives and imponderables have been reviewed with the patient. Questions have been answered. All parties are agreeable.      Notice:  This dictation was prepared with Dragon dictation along with smaller phrase technology. Any transcriptional errors that result from this process are unintentional and may not be corrected upon review.

## 2017-07-06 NOTE — Transfer of Care (Signed)
Immediate Anesthesia Transfer of Care Note  Patient: Ann Roberson  Procedure(s) Performed: COLONOSCOPY WITH PROPOFOL (N/A ) POLYPECTOMY  Patient Location: PACU  Anesthesia Type:MAC  Level of Consciousness: awake, alert , oriented, drowsy and patient cooperative  Airway & Oxygen Therapy: Patient Spontanous Breathing and Patient connected to face mask oxygen  Post-op Assessment: Report given to RN and Post -op Vital signs reviewed and stable  Post vital signs: Reviewed and stable  Last Vitals:  Vitals Value Taken Time  BP    Temp    Pulse 85 07/06/2017  9:29 AM  Resp 17 07/06/2017  9:29 AM  SpO2 100 % 07/06/2017  9:29 AM  Vitals shown include unvalidated device data.  Last Pain:  Vitals:   07/06/17 0736  TempSrc: Oral  PainSc: 0-No pain      Patients Stated Pain Goal: 8 (88/64/84 [id number])  Complications: No apparent anesthesia complications

## 2017-07-08 ENCOUNTER — Encounter: Payer: Self-pay | Admitting: Internal Medicine

## 2017-07-10 ENCOUNTER — Telehealth: Payer: Self-pay

## 2017-07-10 NOTE — Telephone Encounter (Signed)
Per RMR- Send letter to patient.  Send copy of letter with path to referring provider and PCP.  Patient needs an office visit in 6 months to set up a repeat colonoscopy.   Patient needs to be done under general anesthesia in 6 months. Needs to be intubated so respirations can be controlled to remove the remainder of the tough polyp that persists.

## 2017-07-11 NOTE — Telephone Encounter (Signed)
OV made °

## 2017-07-12 ENCOUNTER — Encounter (HOSPITAL_COMMUNITY): Payer: Self-pay | Admitting: Internal Medicine

## 2017-07-13 NOTE — Telephone Encounter (Signed)
Patient called and received results from result letter and she is aware of her f/u appt in 12/2017

## 2017-07-20 ENCOUNTER — Ambulatory Visit (INDEPENDENT_AMBULATORY_CARE_PROVIDER_SITE_OTHER): Payer: BLUE CROSS/BLUE SHIELD | Admitting: Family Medicine

## 2017-07-20 ENCOUNTER — Encounter: Payer: Self-pay | Admitting: Family Medicine

## 2017-07-20 ENCOUNTER — Other Ambulatory Visit: Payer: Self-pay

## 2017-07-20 VITALS — BP 142/82 | HR 76 | Temp 98.3°F | Resp 16 | Ht 64.0 in | Wt 243.8 lb

## 2017-07-20 DIAGNOSIS — E1159 Type 2 diabetes mellitus with other circulatory complications: Secondary | ICD-10-CM

## 2017-07-20 DIAGNOSIS — I10 Essential (primary) hypertension: Secondary | ICD-10-CM

## 2017-07-20 DIAGNOSIS — E1169 Type 2 diabetes mellitus with other specified complication: Secondary | ICD-10-CM | POA: Diagnosis not present

## 2017-07-20 DIAGNOSIS — E785 Hyperlipidemia, unspecified: Secondary | ICD-10-CM | POA: Diagnosis not present

## 2017-07-20 DIAGNOSIS — H6122 Impacted cerumen, left ear: Secondary | ICD-10-CM | POA: Diagnosis not present

## 2017-07-20 DIAGNOSIS — I152 Hypertension secondary to endocrine disorders: Secondary | ICD-10-CM

## 2017-07-20 MED ORDER — AMLODIPINE BESYLATE 5 MG PO TABS: 5.0000 mg | ORAL_TABLET | Freq: Every day | ORAL | 3 refills | Status: DC

## 2017-07-20 NOTE — Progress Notes (Signed)
Patient ID: Ann Roberson, female    DOB: 03-08-1951, 67 y.o.   MRN: [id number]  Chief Complaint  Patient presents with  . Follow-up    Allergies Erythromycin  Subjective:   Ann Roberson is a 67 y.o. female who presents to [facility] today.  HPI Ann Roberson presents today for follow-up visit.  She recently had her colonoscopy and was noted to have some polyps.  They were not able to fully remove the polyps and had to stop the colonoscopy secondary to some breathing issues.  She is scheduled to go back and have the rest of the polyp removed in October 2019.  She reports that she has not had any trouble since the colonoscopy.  She reports that she is doing well.  Bowel movements are normal.  No melena or bright red blood per rectum.  Has lost 20 pounds since she was last seen here.  Reports that she is trying to eat healthy and to eat less.  Her activity level has not decreased but her exercise ability is limited secondary to her knee pain.  She is still hoping to lose the weight necessary so that she can get a knee replacement.  She does express dissatisfaction with her job today.  But she reports that her overall mood is good.  She reports her energy level is good.  She is taking all of her blood pressure medications.  She is taking losartan 100 mg and HCTZ 25 mg a day.  She denies any side effects.  She denies any chest pain, shortness of breath, or swelling in her extremities.  Her blood pressures were reviewed from her recent visit with Dr. Melvyn Novas and from the colonoscopy.  All of her readings were approximately 140/80.  She reports that that is how her blood pressure usually runs.  She is a diabetic and is taking her insulin and blood sugar medications.  She denies any lesions on her feet.  She does report that she has some left ear fullness and was told that she could have some earwax in there.  She would like that checked today.  She reports that the earwax is  bothering her.  She feels some pressure in that ear.  She can hear okay.  She reports she is taking all her medications as directed.   Past Medical History:  Diagnosis Date  . Anemia   . Arthritis   . Diabetes mellitus   . GERD (gastroesophageal reflux disease)   . Heart murmur   . Hyperlipidemia   . Hypertension   . Neuropathy   . PONV (postoperative nausea and vomiting)   . Sarcoidosis     Past Surgical History:  Procedure Laterality Date  . CESAREAN SECTION    . CHOLECYSTECTOMY    . COLONOSCOPY WITH PROPOFOL N/A 07/06/2017   Procedure: COLONOSCOPY WITH PROPOFOL;  Surgeon: Daneil Dolin, MD;  [facility];  Service: Endoscopy;  Laterality: N/A;  8:30am  . MASTECTOMY, PARTIAL Left    benign  . POLYPECTOMY  07/06/2017   Procedure: POLYPECTOMY;  Surgeon: Daneil Dolin, MD;  Location: [facility];  Service: Endoscopy;;  cecal polyp cs, ascending colon polyp hs  . TUBAL LIGATION      Family History  Problem Relation Age of Onset  . Asthma Brother   . Cancer Mother   . Cancer Father   . Cancer Brother   . Diabetes Brother   . Allergic rhinitis Neg Hx   .  Angioedema Neg Hx   . Atopy Neg Hx   . Eczema Neg Hx   . Immunodeficiency Neg Hx   . Urticaria Neg Hx      Social History   Socioeconomic History  . Marital status: Single    Spouse name: Not on file  . Number of children: Not on file  . Years of education: Not on file  . Highest education level: Not on file  Occupational History  . Not on file  Social Needs  . Financial resource strain: Not on file  . Food insecurity:    Worry: Not on file    Inability: Not on file  . Transportation needs:    Medical: Not on file    Non-medical: Not on file  Tobacco Use  . Smoking status: Never Smoker  . Smokeless tobacco: Never Used  Substance and Sexual Activity  . Alcohol use: No  . Drug use: No  . Sexual activity: Not Currently    Birth control/protection: Post-menopausal  Lifestyle  . Physical  activity:    Days per week: Not on file    Minutes per session: Not on file  . Stress: Not on file  Relationships  . Social connections:    Talks on phone: Not on file    Gets together: Not on file    Attends religious service: Not on file    Active member of club or organization: Not on file    Attends meetings of clubs or organizations: Not on file    Relationship status: Not on file  Other Topics Concern  . Not on file  Social History Narrative   Lives in [place], [place]. 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   Current Outpatient Medications on File Prior to Visit  Medication Sig Dispense Refill  . acetaminophen (TYLENOL) 650 MG CR tablet Take 1,300 mg by mouth 2 (two) times daily.    [name] aspirin EC 81 MG tablet Take 81 mg by mouth daily.    . Cyanocobalamin (VITAMIN B-12 PO) Take 1 tablet by mouth daily.    . diphenhydrAMINE (BENADRYL ALLERGY) 25 MG tablet Take 25 mg by mouth every 6 (six) hours as needed for allergies.     [name] gabapentin (NEURONTIN) 300 MG capsule Take 2 capsules (600 mg total) by mouth 3 (three) times daily. 180 capsule 3  . hydrochlorothiazide (HYDRODIURIL) 25 MG tablet Take 25 mg by mouth daily.   1  . insulin glargine (LANTUS) 100 UNIT/ML injection Inject 55 Units into the skin at bedtime.      [name] losartan (COZAAR) 100 MG tablet Take 1 tablet (100 mg total) by mouth daily. 90 tablet 1  . metFORMIN (GLUCOPHAGE) 1000 MG tablet TAKE 1 TABLET BY MOUTH TWICE DAILY (Patient taking differently: TAKE 1000 MG BY MOUTH TWICE DAILY) 60 tablet 0  . Misc Natural Products (ADVANCED JOINT RELIEF PO) Take 1 tablet by mouth daily.    . potassium chloride SA (K-DUR,KLOR-CON) 20 MEQ tablet Take 20 mEq by mouth daily.    . simvastatin (ZOCOR) 20 MG tablet Take 20 mg by mouth at bedtime.      No current facility-administered medications on file prior to visit.     Review of Systems  Constitutional: Negative for activity change, appetite  change and fever.  HENT: Positive for ear pain. Negative for congestion, hearing loss, sinus pain, sneezing, sore throat and trouble swallowing.   Eyes: Negative for visual disturbance.  Respiratory: Negative for cough, chest tightness and shortness of breath.   Cardiovascular: Negative for chest pain, palpitations and leg swelling.  Gastrointestinal: Negative for abdominal pain, nausea and vomiting.  Genitourinary: Negative for difficulty urinating, dysuria, frequency and urgency.  Musculoskeletal: Negative for myalgias.       Has chronic knee pain.  Neurological: Negative for dizziness, syncope, light-headedness, numbness and headaches.  Hematological: Negative for adenopathy.  Psychiatric/Behavioral: Negative for dysphoric mood and sleep disturbance. The patient is not nervous/anxious.      Objective:   BP (!) 142/82 (BP Location: Left Arm, Patient Position: Sitting, Cuff Size: Normal)   Pulse 76   Temp 98.3 F (36.8 C) (Temporal)   Resp 16   Ht 5\' 4"  (1.626 m)   Wt 243 lb 12 oz (110.6 kg)   SpO2 97%   BMI 41.84 kg/m   Physical Exam  Constitutional: She is oriented to person, place, and time. She appears well-developed and well-nourished. No distress.  HENT:  Head: Normocephalic and atraumatic.  Right Ear: External ear normal.  Nose: Nose normal.  Mouth/Throat: Uvula is midline and oropharynx is clear and moist.  Left ear canal obstructed with cerumen.  Left ear irrigated/lavaged with removal of cerumen.  Tympanic membrane visualized after ear wax removal.  Tympanic membrane within normal limits.  Normal light reflex.  Intact.  Ear canal within normal limits.  Patient was symptomatic improvement after removal of earwax.  Eyes: Pupils are equal, round, and reactive to light.  Neck: Normal range of motion. Neck supple. No thyromegaly present.  Cardiovascular: Normal rate, regular rhythm and normal heart sounds.  Pulmonary/Chest: Effort normal and breath sounds normal. No  respiratory distress.  Neurological: She is alert and oriented to person, place, and time. No cranial nerve deficit.  Skin: Skin is warm and dry.  Nursing note and vitals reviewed. No lesions on feet bilaterally.  2+ dorsalis pedis pulses.  Toenails clean, dry and intact.  No thickening or dryness of feet. Depression screen [facility] 2/9 07/20/2017 04/06/2017  Decreased Interest 0 0  Down, Depressed, Hopeless 0 1  PHQ - 2 Score 0 1    Assessment and Plan  1. Hyperlipidemia associated with type 2 diabetes mellitus ([place]) LDL currently at goal.  Continue statins as directed.  Check LFTs q. 6 months. Patient understands risks and side effects of statins.  Will call if any problems.  2. Type 2 diabetes mellitus with other specified complication, without long-term current use of insulin ([place]) Well-controlled.  Diabetes self-care discussed.  Continue medications. 3. Hypertension associated with diabetes ([place]) This blood pressure goal with patient today.  Blood pressure goal is less than 130/80.  We will add Norvasc at this time. Check blood pressure in 2 to 4 weeks. Regarding risk versus benefit of medication.  Continue diet modifications and weight loss to aid with blood pressure control. - amLODipine (NORVASC) 5 MG tablet; Take 1 tablet (5 mg total) by mouth daily.  Dispense: 90 tablet; Refill: 3  4. Impacted cerumen of left ear Ear care discussed with patient.  Ear wax removed with symptomatic improvement. We will plan to give next pneumonia shot at time of flu shot in the fall. Return in about 1 month (around 08/17/2017) for Blood pressure. Caren Macadam, MD 07/20/2017

## 2017-08-02 ENCOUNTER — Other Ambulatory Visit: Payer: Self-pay | Admitting: Family Medicine

## 2017-08-22 ENCOUNTER — Ambulatory Visit: Payer: BLUE CROSS/BLUE SHIELD | Admitting: Cardiovascular Disease

## 2017-08-23 ENCOUNTER — Ambulatory Visit: Payer: BLUE CROSS/BLUE SHIELD | Admitting: Family Medicine

## 2017-08-29 ENCOUNTER — Ambulatory Visit (INDEPENDENT_AMBULATORY_CARE_PROVIDER_SITE_OTHER): Payer: BLUE CROSS/BLUE SHIELD | Admitting: Cardiovascular Disease

## 2017-08-29 ENCOUNTER — Encounter: Payer: Self-pay | Admitting: Cardiovascular Disease

## 2017-08-29 ENCOUNTER — Ambulatory Visit: Payer: BLUE CROSS/BLUE SHIELD | Admitting: Cardiovascular Disease

## 2017-08-29 VITALS — BP 128/78 | HR 94 | Ht 64.0 in | Wt 270.0 lb

## 2017-08-29 DIAGNOSIS — Z794 Long term (current) use of insulin: Secondary | ICD-10-CM

## 2017-08-29 DIAGNOSIS — E785 Hyperlipidemia, unspecified: Secondary | ICD-10-CM | POA: Diagnosis not present

## 2017-08-29 DIAGNOSIS — R9439 Abnormal result of other cardiovascular function study: Secondary | ICD-10-CM | POA: Diagnosis not present

## 2017-08-29 DIAGNOSIS — E119 Type 2 diabetes mellitus without complications: Secondary | ICD-10-CM

## 2017-08-29 DIAGNOSIS — I10 Essential (primary) hypertension: Secondary | ICD-10-CM | POA: Diagnosis not present

## 2017-08-29 DIAGNOSIS — I25118 Atherosclerotic heart disease of native coronary artery with other forms of angina pectoris: Secondary | ICD-10-CM | POA: Diagnosis not present

## 2017-08-29 DIAGNOSIS — R079 Chest pain, unspecified: Secondary | ICD-10-CM | POA: Diagnosis not present

## 2017-08-29 DIAGNOSIS — I350 Nonrheumatic aortic (valve) stenosis: Secondary | ICD-10-CM

## 2017-08-29 DIAGNOSIS — IMO0001 Reserved for inherently not codable concepts without codable children: Secondary | ICD-10-CM

## 2017-08-29 MED ORDER — NITROGLYCERIN 0.4 MG SL SUBL: 0.4000 mg | SUBLINGUAL_TABLET | SUBLINGUAL | 3 refills | Status: AC | PRN

## 2017-08-29 MED ORDER — ATORVASTATIN CALCIUM 40 MG PO TABS: 40.0000 mg | ORAL_TABLET | Freq: Every day | ORAL | 3 refills | Status: DC

## 2017-08-29 MED ORDER — METOPROLOL TARTRATE 25 MG PO TABS: 25.0000 mg | ORAL_TABLET | Freq: Two times a day (BID) | ORAL | 3 refills | Status: DC

## 2017-08-29 NOTE — Progress Notes (Signed)
SUBJECTIVE: The patient returns for follow-up after undergoing cardiovascular testing performed for the evaluation of chest pain.  Nuclear stress test on 05/19/2017 was an intermediate risk study, with a medium, mild intensity, inferoseptal and inferolateral defect that partially reversible in the mid to apical distribution and fixed at the base suggestive of scar with mild to moderate peri-infarct ischemia.   She described significant stress at work.  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  She has had 3 or 4 episodes of chest pain since her last visit with me in February.  Each episode occurs at rest and last for about 5 minutes.  She has some occasional wheezing.  She denies leg edema.  She has occasional pains in her legs.     Review of Systems: As per "subjective", otherwise negative.  Allergies  Allergen Reactions  . Erythromycin Nausea And Vomiting    Current Outpatient Medications  Medication Sig Dispense Refill  . acetaminophen (TYLENOL) 650 MG CR tablet Take 1,300 mg by mouth 2 (two) times daily.    [name] amLODipine (NORVASC) 5 MG tablet Take 1 tablet (5 mg total) by mouth daily. 90 tablet 3  . aspirin EC 81 MG tablet Take 81 mg by mouth daily.    . Cyanocobalamin (VITAMIN B-12 PO) Take 1 tablet by mouth daily.    . diphenhydrAMINE (BENADRYL ALLERGY) 25 MG tablet Take 25 mg by mouth every 6 (six) hours as needed for allergies.     [name] gabapentin (NEURONTIN) 300 MG capsule Take 2 capsules (600 mg total) by mouth 3 (three) times daily. 180 capsule 3  . hydrochlorothiazide (HYDRODIURIL) 25 MG tablet Take 25 mg by mouth daily.   1  . insulin glargine (LANTUS) 100 UNIT/ML injection Inject 55 Units into the skin at bedtime.      [name] losartan (COZAAR) 100 MG tablet Take 1 tablet (100 mg total) by mouth daily. 90 tablet 1  . metFORMIN (GLUCOPHAGE) 1000 MG tablet TAKE 1 TABLET BY MOUTH TWICE DAILY 60 tablet 3  . Misc Natural Products (ADVANCED JOINT RELIEF  PO) Take 1 tablet by mouth daily.    . potassium chloride SA (K-DUR,KLOR-CON) 20 MEQ tablet Take 20 mEq by mouth daily.    . simvastatin (ZOCOR) 20 MG tablet Take 20 mg by mouth at bedtime.      No current facility-administered medications for this visit.     Past Medical History:  Diagnosis Date  . Anemia   . Arthritis   . Diabetes mellitus   . GERD (gastroesophageal reflux disease)   . Heart murmur   . Hyperlipidemia   . Hypertension   . Neuropathy   . PONV (postoperative nausea and vomiting)   . Sarcoidosis     Past Surgical History:  Procedure Laterality Date  . CESAREAN SECTION    . CHOLECYSTECTOMY    . COLONOSCOPY WITH PROPOFOL N/A 07/06/2017   Procedure: COLONOSCOPY WITH PROPOFOL;  Surgeon: Daneil Dolin, MD;  [facility];  Service: Endoscopy;  Laterality: N/A;  8:30am  . MASTECTOMY, PARTIAL Left    benign  . POLYPECTOMY  07/06/2017   Procedure: POLYPECTOMY;  Surgeon: Daneil Dolin, MD;  [facility];  Service: Endoscopy;;  cecal polyp cs, ascending colon polyp hs  . TUBAL LIGATION      Social History   Socioeconomic History  . Marital status: Single    Spouse name: Not on file  . Number  of children: Not on file  . Years of education: Not on file  . Highest education level: Not on file  Occupational History  . Not on file  Social Needs  . Financial resource strain: Not on file  . Food insecurity:    Worry: Not on file    Inability: Not on file  . Transportation needs:    Medical: Not on file    Non-medical: Not on file  Tobacco Use  . Smoking status: Never Smoker  . Smokeless tobacco: Never Used  Substance and Sexual Activity  . Alcohol use: No  . Drug use: No  . Sexual activity: Not Currently    Birth control/protection: Post-menopausal  Lifestyle  . Physical activity:    Days per week: Not on file    Minutes per session: Not on file  . Stress: Not on file  Relationships  . Social connections:    Talks on phone: Not  on file    Gets together: Not on file    Attends religious service: Not on file    Active member of club or organization: Not on file    Attends meetings of clubs or organizations: Not on file    Relationship status: Not on file  . Intimate partner violence:    Fear of current or ex partner: Not on file    Emotionally abused: Not on file    Physically abused: Not on file    Forced sexual activity: Not on file  Other Topics Concern  . Not on file  Social History Narrative   Lives in [place], [place]. 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     Vitals:   08/29/17 1424  BP: 128/78  Pulse: 94  SpO2: 98%  Weight: 270 lb (122.5 kg)  Height: 5\' 4"  (1.626 m)    Wt Readings from Last 3 Encounters:  08/29/17 270 lb (122.5 kg)  07/20/17 243 lb 12 oz (110.6 kg)  06/30/17 264 lb (119.7 kg)     PHYSICAL EXAM General: NAD HEENT: Normal. Neck: No JVD, no thyromegaly. Lungs: Clear to auscultation bilaterally with normal respiratory effort. CV: Regular rate and rhythm, normal S1/S2, no K9/F8, 2/6 systolic murmur over RUSB. No pretibial or periankle edema. Abdomen: Soft, nontender, no distention.  Neurologic: Alert and oriented.  Psych: Normal affect. Skin: Normal. Musculoskeletal: No gross deformities.    ECG: Most recent ECG reviewed.   Labs: Lab Results  Component Value Date/Time   K 4.2 06/30/2017 03:12 PM   BUN 18 06/30/2017 03:12 PM   CREATININE 0.92 06/30/2017 03:12 PM   CREATININE 0.94 06/12/2017 07:19 AM   ALT 13 04/24/2017 07:16 AM   TSH 1.22 11/17/2015 02:43 PM   HGB 11.0 (L) 06/30/2017 03:12 PM     Lipids: Lab Results  Component Value Date/Time   LDLCALC 61 04/24/2017 07:16 AM   CHOL 132 04/24/2017 07:16 AM   TRIG 147 04/24/2017 07:16 AM   HDL 48 (L) 04/24/2017 07:16 AM       ASSESSMENT AND PLAN: 1.  Aortic stenosis: This is mild in severity with a mean gradient of 13 mmHg.  I will monitor with clinical surveillance  and echocardiography.  2.  Hypertension: Blood pressure is normal today.  She is on losartan and hydrochlorothiazide.  I will monitor blood pressure as I am starting metoprolol tartrate 25 mg twice daily.  3.  Hyperlipidemia: Lipids previously reviewed.    I will switch  simvastatin 20 mg to Lipitor 40 mg.  4.  Insulin-dependent diabetes mellitus: Currently on insulin and metformin.  HbA1c 6.5% on 04/24/17.  5.  Chest pain with abnormal nuclear stress test/presumed coronary artery disease: She has several cardiovascular risk factors with both symptoms and stress test results suggestive of ischemic heart disease.  She is currently on aspirin and statin therapy.  I will start metoprolol tartrate 25 mg twice daily.  I will prescribe nitroglycerin.  I will switch simvastatin 20 mg to Lipitor 40 mg.    Disposition: Follow up 3 months   Kate Sable, M.D., F.A.C.C.

## 2017-08-29 NOTE — Patient Instructions (Addendum)
Your physician recommends that you schedule a follow-up appointment in: 3 months with Dr.Koneswaran    STOP Simvastatin   START Atorvastatin 40 mg at dinner  START Metoprolol 25 mg twice a day  Take nitroglycerin as directed       Nitroglycerin sublingual tablets What is this medicine? NITROGLYCERIN (nye troe GLI ser in) is a type of vasodilator. It relaxes blood vessels, increasing the blood and oxygen supply to your heart. This medicine is used to relieve chest pain caused by angina. It is also used to prevent chest pain before activities like climbing stairs, going outdoors in cold weather, or sexual activity. This medicine may be used for other purposes; ask your health care provider or pharmacist if you have questions. COMMON BRAND NAME(S): Nitroquick, Nitrostat, Nitrotab What should I tell my health care provider before I take this medicine? They need to know if you have any of these conditions: -anemia -head injury, recent stroke, or bleeding in the brain -liver disease -previous heart attack -an unusual or allergic reaction to nitroglycerin, other medicines, foods, dyes, or preservatives -pregnant or trying to get pregnant -breast-feeding How should I use this medicine? Take this medicine by mouth as needed. At the first sign of an angina attack (chest pain or tightness) place one tablet under your tongue. You can also take this medicine 5 to 10 minutes before an event likely to produce chest pain. Follow the directions on the prescription label. Let the tablet dissolve under the tongue. Do not swallow whole. Replace the dose if you accidentally swallow it. It will help if your mouth is not dry. Saliva around the tablet will help it to dissolve more quickly. Do not eat or drink, smoke or chew tobacco while a tablet is dissolving. If you are not better within 5 minutes after taking ONE dose of nitroglycerin, call 9-1-1 immediately to seek emergency medical care. Do not take  more than 3 nitroglycerin tablets over 15 minutes. If you take this medicine often to relieve symptoms of angina, your doctor or health care professional may provide you with different instructions to manage your symptoms. If symptoms do not go away after following these instructions, it is important to call 9-1-1 immediately. Do not take more than 3 nitroglycerin tablets over 15 minutes. Talk to your pediatrician regarding the use of this medicine in children. Special care may be needed. Overdosage: If you think you have taken too much of this medicine contact a poison control center or emergency room at once. NOTE: This medicine is only for you. Do not share this medicine with others. What if I miss a dose? This does not apply. This medicine is only used as needed. What may interact with this medicine? Do not take this medicine with any of the following medications: -certain migraine medicines like ergotamine and dihydroergotamine (DHE) -medicines used to treat erectile dysfunction like sildenafil, tadalafil, and vardenafil -riociguat This medicine may also interact with the following medications: -alteplase -aspirin -heparin -medicines for high blood pressure -medicines for mental depression -other medicines used to treat angina -phenothiazines like chlorpromazine, mesoridazine, prochlorperazine, thioridazine This list may not describe all possible interactions. Give your health care provider a list of all the medicines, herbs, non-prescription drugs, or dietary supplements you use. Also tell them if you smoke, drink alcohol, or use illegal drugs. Some items may interact with your medicine. What should I watch for while using this medicine? Tell your doctor or health care professional if you feel your medicine is no  longer working. Keep this medicine with you at all times. Sit or lie down when you take your medicine to prevent falling if you feel dizzy or faint after using it. Try to remain  calm. This will help you to feel better faster. If you feel dizzy, take several deep breaths and lie down with your feet propped up, or bend forward with your head resting between your knees. You may get drowsy or dizzy. Do not drive, use machinery, or do anything that needs mental alertness until you know how this drug affects you. Do not stand or sit up quickly, especially if you are an older patient. This reduces the risk of dizzy or fainting spells. Alcohol can make you more drowsy and dizzy. Avoid alcoholic drinks. Do not treat yourself for coughs, colds, or pain while you are taking this medicine without asking your doctor or health care professional for advice. Some ingredients may increase your blood pressure. What side effects may I notice from receiving this medicine? Side effects that you should report to your doctor or health care professional as soon as possible: -blurred vision -dry mouth -skin rash -sweating -the feeling of extreme pressure in the head -unusually weak or tired Side effects that usually do not require medical attention (report to your doctor or health care professional if they continue or are bothersome): -flushing of the face or neck -headache -irregular heartbeat, palpitations -nausea, vomiting This list may not describe all possible side effects. Call your doctor for medical advice about side effects. You may report side effects to FDA at 1-800-FDA-1088. Where should I keep my medicine? Keep out of the reach of children. Store at room temperature between 20 and 25 degrees C (68 and 77 degrees F). Store in [employer]. Protect from light and moisture. Keep tightly closed. Throw away any unused medicine after the expiration date. NOTE: This sheet is a summary. It may not cover all possible information. If you have questions about this medicine, talk to your doctor, pharmacist, or health care provider.  2018 Elsevier/Gold Standard (2013-01-10  17:57:36)   No lab work or tests today     Thank you for choosing [facility] !

## 2017-08-30 ENCOUNTER — Encounter: Payer: Self-pay | Admitting: Family Medicine

## 2017-08-30 ENCOUNTER — Ambulatory Visit (INDEPENDENT_AMBULATORY_CARE_PROVIDER_SITE_OTHER): Payer: BLUE CROSS/BLUE SHIELD | Admitting: Family Medicine

## 2017-08-30 ENCOUNTER — Telehealth: Payer: Self-pay | Admitting: Family Medicine

## 2017-08-30 ENCOUNTER — Other Ambulatory Visit: Payer: Self-pay

## 2017-08-30 VITALS — BP 130/60 | HR 94 | Temp 98.4°F | Resp 18 | Ht 64.0 in | Wt 272.0 lb

## 2017-08-30 DIAGNOSIS — F321 Major depressive disorder, single episode, moderate: Secondary | ICD-10-CM | POA: Diagnosis not present

## 2017-08-30 DIAGNOSIS — E876 Hypokalemia: Secondary | ICD-10-CM

## 2017-08-30 DIAGNOSIS — I152 Hypertension secondary to endocrine disorders: Secondary | ICD-10-CM

## 2017-08-30 DIAGNOSIS — E1159 Type 2 diabetes mellitus with other circulatory complications: Secondary | ICD-10-CM | POA: Diagnosis not present

## 2017-08-30 DIAGNOSIS — I10 Essential (primary) hypertension: Secondary | ICD-10-CM

## 2017-08-30 DIAGNOSIS — M170 Bilateral primary osteoarthritis of knee: Secondary | ICD-10-CM

## 2017-08-30 MED ORDER — DULOXETINE HCL 30 MG PO CPEP: 30.0000 mg | ORAL_CAPSULE | Freq: Every day | ORAL | 1 refills | Status: DC

## 2017-08-30 MED ORDER — POTASSIUM CHLORIDE CRYS ER 20 MEQ PO TBCR: 20.0000 meq | EXTENDED_RELEASE_TABLET | Freq: Every day | ORAL | 1 refills | Status: DC

## 2017-08-30 NOTE — Telephone Encounter (Signed)
-----   Message from Herminio Commons, MD sent at 08/30/2017  5:06 PM EDT ----- She needs optimal medical management first. If symptoms persist or worsen with good medical therapy, I would then cath.  ----- Message ----- From: Caren Macadam, MD Sent: 08/30/2017   4:53 PM To: Herminio Commons, MD  Do you think that she needs a cath? She was asking today why she was not referred for cath?

## 2017-08-30 NOTE — Progress Notes (Signed)
Patient ID: Ann Roberson, female    DOB: [birth year], 67 y.o.   MRN: [id number]  Chief Complaint  Patient presents with  . Hypertension    1 month follow up  . Diabetes    Allergies Erythromycin  Subjective:   Ann Roberson is a 66 y.o. female who presents to [facility] today.  HPI Here for follow up. At the last visit, norvasc was added b/c BP was elevated above goal.  She reports that since that time she has been seen by Dr. Bronson Ing and she had an abnormal nuclear stress test.  Her cholesterol medication was changed from simvastatin to Lipitor.  In addition, she was started on metoprolol.  She has not started this medication yet.  She plans to pick it up today.  She was seen by Dr. Bronson Ing yesterday.  She reports that she did have a cardiac cath greater than 10 years ago.  She reports the cath was within normal limits.  She does have questions as to why she was not referred for catheterization to see if she has a blockage.  She reports that she is still suffering from her chronic arthritis pain in her knees. Knees hurt and body aches. Wants to get knee replacement but cannot b/c has to loose more weight.  Overall, she reports that her mood is not been very good.  Reports that does feel down and stressed. Is not happy with job. Reports that feels sad and is alone a lot. Does have contact with her son and grandson. Feels lonely at night and goes to bed early because does not have much to do. Job is stressful. Wakes up at night from time to time. 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  Denies any suicidal or homicidal ideation.  Does still find joy in being with her family.  Appetite is good.  Energy level is somewhat low.  Does wake up several times at night.   Past Medical History:  Diagnosis Date  . Anemia   . Arthritis   . Diabetes mellitus   . GERD (gastroesophageal reflux disease)   . Heart murmur   . Hyperlipidemia   . Hypertension    . Neuropathy   . PONV (postoperative nausea and vomiting)   . Sarcoidosis     Past Surgical History:  Procedure Laterality Date  . CESAREAN SECTION    . CHOLECYSTECTOMY    . COLONOSCOPY WITH PROPOFOL N/A 07/06/2017   Procedure: COLONOSCOPY WITH PROPOFOL;  Surgeon: Daneil Dolin, MD;  [facility];  Service: Endoscopy;  Laterality: N/A;  8:30am  . MASTECTOMY, PARTIAL Left    benign  . POLYPECTOMY  07/06/2017   Procedure: POLYPECTOMY;  Surgeon: Daneil Dolin, MD;  Location: [facility];  Service: Endoscopy;;  cecal polyp cs, ascending colon polyp hs  . TUBAL LIGATION      Family History  Problem Relation Age of Onset  . Asthma Brother   . Cancer Mother   . Cancer Father   . Cancer Brother   . Diabetes Brother   . Allergic rhinitis Neg Hx   . Angioedema Neg Hx   . Atopy Neg Hx   . Eczema Neg Hx   . Immunodeficiency Neg Hx   . Urticaria Neg Hx      Social History   Socioeconomic History  . Marital status: Single    Spouse name: Not on file  . Number of children: Not on file  .  Years of education: Not on file  . Highest education level: Not on file  Occupational History  . Not on file  Social Needs  . Financial resource strain: Not on file  . Food insecurity:    Worry: Not on file    Inability: Not on file  . Transportation needs:    Medical: Not on file    Non-medical: Not on file  Tobacco Use  . Smoking status: Never Smoker  . Smokeless tobacco: Never Used  Substance and Sexual Activity  . Alcohol use: No  . Drug use: No  . Sexual activity: Not Currently    Birth control/protection: Post-menopausal  Lifestyle  . Physical activity:    Days per week: Not on file    Minutes per session: Not on file  . Stress: Not on file  Relationships  . Social connections:    Talks on phone: Not on file    Gets together: Not on file    Attends religious service: Not on file    Active member of club or organization: Not on file    Attends meetings of  clubs or organizations: Not on file    Relationship status: Not on file  Other Topics Concern  . Not on file  Social History Narrative   Lives in [place], [place]. 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    Review of Systems  Constitutional: Negative for activity change, appetite change and fever.  HENT: Negative for trouble swallowing and voice change.   Eyes: Negative for visual disturbance.  Respiratory: Negative for cough, chest tightness, shortness of breath and wheezing.        No current chest pain or chest tightness.  Cardiovascular: Negative for chest pain, palpitations and leg swelling.  Gastrointestinal: Negative for abdominal pain, nausea and vomiting.  Genitourinary: Negative for dysuria, frequency and urgency.  Musculoskeletal: Positive for arthralgias.  Neurological: Negative for dizziness, syncope and light-headedness.  Hematological: Negative for adenopathy.  Psychiatric/Behavioral: Positive for dysphoric mood and sleep disturbance. Negative for behavioral problems and suicidal ideas. The patient is nervous/anxious.      Objective:   BP 130/60 (BP Location: Left Arm, Patient Position: Sitting, Cuff Size: Large)   Pulse 94   Temp 98.4 F (36.9 C) (Temporal)   Resp 18   Ht 5\' 4"  (1.626 m)   Wt 272 lb (123.4 kg)   SpO2 98%   BMI 46.69 kg/m   Physical Exam  Constitutional: She is oriented to person, place, and time. She appears well-developed and well-nourished. No distress.  HENT:  Head: Normocephalic and atraumatic.  Eyes: Pupils are equal, round, and reactive to light.  Neck: Normal range of motion. Neck supple. No thyromegaly present.  Cardiovascular: Normal rate, regular rhythm and normal heart sounds.  Pulmonary/Chest: Effort normal and breath sounds normal. No respiratory distress.  Neurological: She is alert and oriented to person, place, and time. No cranial nerve deficit.  Skin: Skin is warm and dry.    Psychiatric: Her speech is normal and behavior is normal. Judgment and thought content normal. Cognition and memory are normal. She exhibits a depressed mood.  Nursing note and vitals reviewed.   Depression screen [facility] 2/9 08/30/2017 07/20/2017 04/06/2017  Decreased Interest 0 0 0  Down, Depressed, Hopeless 2 0 1  PHQ - 2 Score 2 0 1  Altered sleeping 2 - -  Tired, decreased energy 3 - -  Change in appetite 0 - -  Feeling bad or failure about yourself  0 - -  Trouble concentrating 0 - -  Moving slowly or fidgety/restless 0 - -  Suicidal thoughts 0 - -  PHQ-9 Score 7 - -  Difficult doing work/chores Somewhat difficult - -    Assessment and Plan  1. Hypertension associated with diabetes ([place]) BP well controlled.  Improved with Norvasc.  Still needing to start beta-blocker as prescribed by Dr. Bronson Ing Did discuss with patient that I would send message to Dr. Bronson Ing regarding Chest pain and question about why she has not been referred for cardiac catheterization/evaluation.   2. Primary osteoarthritis of both knees Follow up with orthopedics.   3. Depression, major, single episode, moderate ([place]) Start cymbalta.  Patient does agree that she feels depressed and overly stressed with her job.  This is not something that she can change at this time.  She would be interested in medication.  We discussed risk versus benefits of medication.  We also discussed the Cymbalta could give her some possible benefit with her chronic knee pain.  She is agreeable to trying this medication and she will follow-up in 1 month. Patient counseled in detail regarding the risks of medication. Told to call or return to clinic if develop any worrisome signs or symptoms. Patient voiced understanding.  Suicide risks evaluated and documented in note if present or in the area below.  Patient has protective factors of family and community support.  Patient reports that family believes is behaving  rationally. Patient displays problem solving skills.   Patient specifically denies suicide ideation. Patient has access/information to healthcare contacts if situation or mood changes where patient is a risk to self or others or mood becomes unstable.   During the encounter, the patient had good eye contact and firm handshake regarding safety contract and agreement to seek help if mood worsens and not to harm self.   Patient understands the treatment plan and is in agreement. Agrees to keep follow up and call prior or return to clinic if needed.   - DULoxetine (CYMBALTA) 30 MG capsule; Take 1 capsule (30 mg total) by mouth daily.  Dispense: 30 capsule; Refill: 1  4. Hypokalemia due to loss of potassium Refill potassium pills. Plan to recheck at follow up.  - potassium chloride SA (K-DUR,KLOR-CON) 20 MEQ tablet; Take 1 tablet (20 mEq total) by mouth daily.  Dispense: 90 tablet; Refill: 1  Patient defers labs today and agrees to get it done at follow up.  No follow-ups on file. Caren Macadam, MD 08/30/2017

## 2017-08-30 NOTE — Telephone Encounter (Signed)
Please call patient and advised that Dr. Bronson Ing believes that her cardiac situation needs to be managed medically initially.  This means she needs to be taking the beta-blocker/metoprolol twice a day and the different cholesterol medication.  After she is on these medications if her symptoms do not improve or at any point if they worsen then he would proceed with a cardiac catheterization.

## 2017-08-31 ENCOUNTER — Encounter: Payer: Self-pay | Admitting: Family Medicine

## 2017-08-31 NOTE — Telephone Encounter (Signed)
Advised patient of Dr.Koneswaran's recommendations with verbal understanding.

## 2017-09-01 ENCOUNTER — Encounter: Payer: Self-pay | Admitting: Family Medicine

## 2017-09-04 ENCOUNTER — Other Ambulatory Visit: Payer: Self-pay | Admitting: Family Medicine

## 2017-09-04 DIAGNOSIS — E1140 Type 2 diabetes mellitus with diabetic neuropathy, unspecified: Secondary | ICD-10-CM

## 2017-09-04 DIAGNOSIS — Z794 Long term (current) use of insulin: Principal | ICD-10-CM

## 2017-09-04 DIAGNOSIS — Z1231 Encounter for screening mammogram for malignant neoplasm of breast: Secondary | ICD-10-CM

## 2017-09-07 ENCOUNTER — Ambulatory Visit: Payer: BLUE CROSS/BLUE SHIELD | Admitting: Family Medicine

## 2017-09-14 ENCOUNTER — Ambulatory Visit (HOSPITAL_COMMUNITY)
Admission: RE | Admit: 2017-09-14 | Discharge: 2017-09-14 | Disposition: A | Discharge status: Home / Self Care | Payer: BLUE CROSS/BLUE SHIELD | Source: Ambulatory Visit | Attending: Family Medicine | Admitting: Family Medicine

## 2017-09-14 DIAGNOSIS — Z1231 Encounter for screening mammogram for malignant neoplasm of breast: Secondary | ICD-10-CM | POA: Diagnosis present

## 2017-09-26 ENCOUNTER — Other Ambulatory Visit: Payer: Self-pay | Admitting: Family Medicine

## 2017-10-06 ENCOUNTER — Encounter: Payer: Self-pay | Admitting: Family Medicine

## 2017-10-06 ENCOUNTER — Other Ambulatory Visit: Payer: Self-pay

## 2017-10-06 ENCOUNTER — Ambulatory Visit (INDEPENDENT_AMBULATORY_CARE_PROVIDER_SITE_OTHER): Payer: BLUE CROSS/BLUE SHIELD | Admitting: Family Medicine

## 2017-10-06 ENCOUNTER — Other Ambulatory Visit (HOSPITAL_COMMUNITY)
Admission: RE | Admit: 2017-10-06 | Discharge: 2017-10-06 | Disposition: A | Discharge status: Home / Self Care | Payer: BLUE CROSS/BLUE SHIELD | Source: Ambulatory Visit | Attending: Family Medicine | Admitting: Family Medicine

## 2017-10-06 VITALS — BP 142/74 | HR 84 | Temp 98.7°F | Resp 14 | Ht 64.0 in | Wt 262.1 lb

## 2017-10-06 DIAGNOSIS — Z124 Encounter for screening for malignant neoplasm of cervix: Secondary | ICD-10-CM | POA: Diagnosis not present

## 2017-10-06 DIAGNOSIS — Z23 Encounter for immunization: Secondary | ICD-10-CM

## 2017-10-06 DIAGNOSIS — I10 Essential (primary) hypertension: Secondary | ICD-10-CM

## 2017-10-06 DIAGNOSIS — Z113 Encounter for screening for infections with a predominantly sexual mode of transmission: Secondary | ICD-10-CM

## 2017-10-06 DIAGNOSIS — E785 Hyperlipidemia, unspecified: Secondary | ICD-10-CM | POA: Diagnosis not present

## 2017-10-06 DIAGNOSIS — N750 Cyst of Bartholin's gland: Secondary | ICD-10-CM | POA: Diagnosis not present

## 2017-10-06 DIAGNOSIS — E1159 Type 2 diabetes mellitus with other circulatory complications: Secondary | ICD-10-CM | POA: Diagnosis not present

## 2017-10-06 DIAGNOSIS — E1169 Type 2 diabetes mellitus with other specified complication: Secondary | ICD-10-CM | POA: Diagnosis not present

## 2017-10-06 DIAGNOSIS — I152 Hypertension secondary to endocrine disorders: Secondary | ICD-10-CM

## 2017-10-06 MED ORDER — SULFAMETHOXAZOLE-TRIMETHOPRIM 800-160 MG PO TABS: 1.0000 | ORAL_TABLET | Freq: Two times a day (BID) | ORAL | 0 refills | Status: DC

## 2017-10-06 NOTE — Progress Notes (Signed)
Patient ID: Ann Roberson, female    DOB: 08-14-1950, 67 y.o.   MRN: [id number]  Chief Complaint  Patient presents with  . Follow-up  . Hypertension    Allergies Erythromycin  Subjective:   Ann Roberson is a 67 y.o. female who presents to [facility] today.  HPI [name] presents today for follow-up visit.  Since she was last seen she has started the Cymbalta 30 mg p.o. daily.  She reports that she can tell that her mood is better.  She also believes that is helped with her pain in her knees.  She reports that overall she believes that the medication is helping her.  She would like to continue it.  She denies any adverse side effects with the medication.  Denies any suicidal or homicidal ideations.  Reports that her mood is improved.  She is enjoying doing things with her grandson.  She reports she has been busy today taking care of her grandchildren and she has not taking her blood pressure medicine.  She reports that she feels well.  She would like to discuss the fact that 3 days ago she noticed a bump/small cyst on her labial region.  She reports she has had the cyst in the past.  She reports they can get red, tender, swollen, and very painful.  She reports that they have had to be lanced in the past.  She has never had a catheter drainage tube placed in the cyst.  She would like for [place] to check this today.  She reports that the area is tender but there is been no drainage.  She reports that does cause some discomfort but it is not to the degree that it is been in the past.  She reports since we are going to have to do a pelvic exam to look at that she would might as well go ahead and get her Pap smear done.  She reports she has been quite sometime since she has had a Pap smear.  She denies any vaginal bleeding.  She reports she occasionally has some vaginal discharge but believes that it is within normal limits.  She denies any vaginal itching.  Denies any foul  vaginal odor.  She denies any pelvic pain.  Is urinating normally.  Denies any history of abnormal Pap smears or sexually transmitted infections.  She is due for her diabetic blood work.  She reports that her heart is beating normal.  Denies any chest pain, shortness of breath, swelling in her extremities, or palpitations.  She reports that overall she is feeling much better.  Reports she is still not overly excited or enthusiastic about her job but she is handling it better now that she is on the medication.  Reports her blood sugars have been running well.  Using her medication and insulin.  Denies any hypoglycemic episodes.  Is due for her diabetic eye exam.  No lesions on her feet.  Is taking all of her medications as directed.  Denies any myalgias associated with her cholesterol medication.     Past Medical History:  Diagnosis Date  . Anemia   . Arthritis   . Diabetes mellitus   . GERD (gastroesophageal reflux disease)   . Heart murmur   . Hyperlipidemia   . Hypertension   . Neuropathy   . PONV (postoperative nausea and vomiting)   . Sarcoidosis     Past Surgical History:  Procedure Laterality Date  . CESAREAN SECTION    .  CHOLECYSTECTOMY    . COLONOSCOPY WITH PROPOFOL N/A 07/06/2017   Procedure: COLONOSCOPY WITH PROPOFOL;  Surgeon: Daneil Dolin, MD;  [facility];  Service: Endoscopy;  Laterality: N/A;  8:30am  . MASTECTOMY, PARTIAL Left    benign  . POLYPECTOMY  07/06/2017   Procedure: POLYPECTOMY;  Surgeon: Daneil Dolin, MD;  [facility];  Service: Endoscopy;;  cecal polyp cs, ascending colon polyp hs  . TUBAL LIGATION      Family History  Problem Relation Age of Onset  . Asthma Brother   . Cancer Mother   . Cancer Father   . Cancer Brother   . Diabetes Brother   . Allergic rhinitis Neg Hx   . Angioedema Neg Hx   . Atopy Neg Hx   . Eczema Neg Hx   . Immunodeficiency Neg Hx   . Urticaria Neg Hx      Social History   Socioeconomic  History  . Marital status: Single    Spouse name: Not on file  . Number of children: Not on file  . Years of education: Not on file  . Highest education level: Not on file  Occupational History  . Not on file  Social Needs  . Financial resource strain: Not on file  . Food insecurity:    Worry: Not on file    Inability: Not on file  . Transportation needs:    Medical: Not on file    Non-medical: Not on file  Tobacco Use  . Smoking status: Never Smoker  . Smokeless tobacco: Never Used  Substance and Sexual Activity  . Alcohol use: No  . Drug use: No  . Sexual activity: Not Currently    Birth control/protection: Post-menopausal  Lifestyle  . Physical activity:    Days per week: Not on file    Minutes per session: Not on file  . Stress: Not on file  Relationships  . Social connections:    Talks on phone: Not on file    Gets together: Not on file    Attends religious service: Not on file    Active member of club or organization: Not on file    Attends meetings of clubs or organizations: Not on file    Relationship status: Not on file  Other Topics Concern  . 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    Review of Systems  Constitutional: Negative for activity change, appetite change, chills, diaphoresis, fever and unexpected weight change.  HENT: Negative for trouble swallowing and voice change.   Eyes: Negative for visual disturbance.  Respiratory: Negative for cough and chest tightness.   Cardiovascular: Negative for leg swelling.  Gastrointestinal: Negative for abdominal pain, nausea and vomiting.  Endocrine: Negative for polyphagia and polyuria.  Genitourinary: Positive for genital sores. Negative for decreased urine volume, difficulty urinating, dyspareunia, dysuria, flank pain, frequency, hematuria, menstrual problem, pelvic pain, urgency, vaginal bleeding and  vaginal discharge.       Patient reports that she has a cystic area/bump on her left labia majora.  Musculoskeletal: Positive for arthralgias.       Persistent pain in her knees.   Skin: Negative for wound.  Neurological: Negative for dizziness, tremors, syncope, facial asymmetry, weakness, light-headedness and numbness.  Hematological: Negative for adenopathy.  Psychiatric/Behavioral: Negative for agitation, behavioral problems, confusion, dysphoric mood, hallucinations, self-injury, sleep disturbance  and suicidal ideas. The patient is not nervous/anxious and is not hyperactive.    Current Outpatient Medications on File Prior to Visit  Medication Sig Dispense Refill  . acetaminophen (TYLENOL) 650 MG CR tablet Take 1,300 mg by mouth 2 (two) times daily.    [name] amLODipine (NORVASC) 5 MG tablet Take 1 tablet (5 mg total) by mouth daily. 90 tablet 3  . aspirin EC 81 MG tablet Take 81 mg by mouth daily.    [name] atorvastatin (LIPITOR) 40 MG tablet Take 1 tablet (40 mg total) by mouth daily. 90 tablet 3  . B-D UF III MINI PEN NEEDLES 31G X 5 MM MISC USE WITH LANTUS PEN 100 each 0  . Cyanocobalamin (VITAMIN B-12 PO) Take 1 tablet by mouth daily.    . diphenhydrAMINE (BENADRYL ALLERGY) 25 MG tablet Take 25 mg by mouth every 6 (six) hours as needed for allergies.     . DULoxetine (CYMBALTA) 30 MG capsule Take 1 capsule (30 mg total) by mouth daily. 30 capsule 1  . gabapentin (NEURONTIN) 300 MG capsule TAKE 2 CAPSULES BY MOUTH THREE TIMES DAILY 180 capsule 1  . hydrochlorothiazide (HYDRODIURIL) 25 MG tablet Take 25 mg by mouth daily.   1  . insulin glargine (LANTUS) 100 UNIT/ML injection Inject 55 Units into the skin at bedtime.      [name] losartan (COZAAR) 100 MG tablet Take 1 tablet (100 mg total) by mouth daily. 90 tablet 1  . metFORMIN (GLUCOPHAGE) 1000 MG tablet TAKE 1 TABLET BY MOUTH TWICE DAILY 60 tablet 3  . metoprolol tartrate (LOPRESSOR) 25 MG tablet Take 1 tablet (25 mg total) by mouth 2 (two) times  daily. 180 tablet 3  . Misc Natural Products (ADVANCED JOINT RELIEF PO) Take 1 tablet by mouth daily.    . nitroGLYCERIN (NITROSTAT) 0.4 MG SL tablet Place 1 tablet (0.4 mg total) under the tongue every 5 (five) minutes as needed. 25 tablet 3  . potassium chloride SA (K-DUR,KLOR-CON) 20 MEQ tablet Take 1 tablet (20 mEq total) by mouth daily. 90 tablet 1   No current facility-administered medications on file prior to visit.     Objective:   BP (!) 142/74 (BP Location: Left Arm, Patient Position: Sitting, Cuff Size: Large)   Pulse 84   Temp 98.7 F (37.1 C) (Temporal)   Resp 14   Ht 5\' 4"  (1.626 m)   Wt 262 lb 1.9 oz (118.9 kg)   SpO2 98%   BMI 44.99 kg/m   Physical Exam  Constitutional: She appears well-developed and well-nourished.  HENT:  Head: Normocephalic and atraumatic.  Eyes: Pupils are equal, round, and reactive to light. EOM are normal.  Neck: Normal range of motion. Neck supple.  Cardiovascular: Normal rate, regular rhythm and normal heart sounds.  Pulmonary/Chest: Effort normal and breath sounds normal.  Abdominal: Hernia confirmed negative in the right inguinal area and confirmed negative in the left inguinal area.  Genitourinary: Vagina normal and uterus normal. Pelvic exam was performed with patient prone. There is no rash, tenderness, lesion or injury on the right labia. There is tenderness and lesion on the left labia. There is no rash or injury on the left labia. Cervix exhibits no motion tenderness, no discharge and no friability. Right adnexum displays no mass, no tenderness and no fullness. Left adnexum displays no mass, no tenderness and no fullness. No tenderness in the vagina. No signs of injury around the vagina. No vaginal discharge found.  Genitourinary Comments: Labia majora with 1.5  x 1.5 cm cystic lesion, tender to palpation.  No drainage.  Circumscribed and cystic in nature without overt fluctuance.  No other skin lesions noted.  Lymphadenopathy: No  inguinal adenopathy noted on the left side.  Skin: Skin is warm and dry. Capillary refill takes less than 2 seconds.  Psychiatric: She has a normal mood and affect. Her behavior is normal. Judgment and thought content normal.  Vitals reviewed.  Depression screen [facility] 2/9 10/06/2017 08/30/2017 07/20/2017 04/06/2017  Decreased Interest 1 0 0 0  Down, Depressed, Hopeless 0 2 0 1  PHQ - 2 Score 1 2 0 1  Altered sleeping 0 2 - -  Tired, decreased energy 1 3 - -  Change in appetite 0 0 - -  Feeling bad or failure about yourself  1 0 - -  Trouble concentrating 0 0 - -  Moving slowly or fidgety/restless 0 0 - -  Suicidal thoughts 0 0 - -  PHQ-9 Score 3 7 - -  Difficult doing work/chores Not difficult at all Somewhat difficult - -    Assessment and Plan  1. Type 2 diabetes mellitus with other specified complication, without long-term current use of insulin [facility]) Patient will return to clinic for labs.  Diabetic eye exam was due June/2019.  Patient asked to get this completed. Dietary and lifestyle modifications recommended. Foot care discussed. - Pneumococcal polysaccharide vaccine 23-valent greater than or equal to 2yo subcutaneous/IM - COMPLETE METABOLIC PANEL WITH GFR - Hemoglobin A1c - Microalbumin / creatinine urine ratio - Ambulatory referral to Ophthalmology  2. Hypertension associated with diabetes ([place]) Continue medications as directed.  Compliance with medication was recommended. Lifestyle modifications discussed with patient including a diet emphasizing vegetables, fruits, and whole grains. Limiting intake of sodium to less than 2,400 mg per day.  Recommendations discussed include consuming low-fat dairy products, poultry, fish, legumes, non-tropical vegetable oils, and nuts; and limiting intake of sweets, sugar-sweetened beverages, and red meat. Discussed following a plan such as the Dietary Approaches to Stop Hypertension (DASH) diet. Patient to read up on this diet.  Recheck blood  pressure next visit.  3. Bartholin gland cyst We will start antibiotics at this time.  Patient will use warm compresses/sitz bath.  She was told that if her symptoms worsen over the weekend she should go to the emergency department.  If her symptoms are not significantly improved by Monday then she will call our office and we will get her in with gynecology for evaluation.  She was counseled concerning worrisome signs and symptoms of infection and if those symptoms occur she will go to the emergency department. - sulfamethoxazole-trimethoprim (BACTRIM DS,SEPTRA DS) 800-160 MG tablet; Take 1 tablet by mouth 2 (two) times daily.  Dispense: 20 tablet; Refill: 0  4. Hyperlipidemia associated with type 2 diabetes mellitus ([place]) Continue statin medication.  Check labs.  Diet modifications discussed. - Lipid panel  5. Screen for STD (sexually transmitted disease) Screening performed per patient request. - HIV antibody - Hepatitis C antibody - RPR  6. Screening for cervical cancer Screening performed today.  Mammogram up-to-date.  Defers clinical breast exam today. - Cytology - PAP She will return to lab for testing prior to her next office visit. Return in about 2 months (around 12/07/2017) for follow up. Caren Macadam, MD 10/06/2017

## 2017-10-11 LAB — CYTOLOGY - PAP
Candida vaginitis: NEGATIVE
Chlamydia: NEGATIVE
Diagnosis: NEGATIVE
HPV: NOT DETECTED
Neisseria Gonorrhea: NEGATIVE
Trichomonas: NEGATIVE

## 2017-10-17 ENCOUNTER — Encounter: Payer: Self-pay | Admitting: Family Medicine

## 2017-11-01 ENCOUNTER — Other Ambulatory Visit: Payer: Self-pay | Admitting: Family Medicine

## 2017-11-01 DIAGNOSIS — F321 Major depressive disorder, single episode, moderate: Secondary | ICD-10-CM

## 2017-11-09 ENCOUNTER — Ambulatory Visit: Payer: BLUE CROSS/BLUE SHIELD | Admitting: Family Medicine

## 2017-11-10 ENCOUNTER — Encounter: Payer: Self-pay | Admitting: Family Medicine

## 2017-11-23 ENCOUNTER — Other Ambulatory Visit: Payer: Self-pay | Admitting: Family Medicine

## 2017-11-23 DIAGNOSIS — E1140 Type 2 diabetes mellitus with diabetic neuropathy, unspecified: Secondary | ICD-10-CM

## 2017-11-23 DIAGNOSIS — Z794 Long term (current) use of insulin: Principal | ICD-10-CM

## 2017-12-06 ENCOUNTER — Encounter: Payer: Self-pay | Admitting: Cardiovascular Disease

## 2017-12-06 ENCOUNTER — Ambulatory Visit (INDEPENDENT_AMBULATORY_CARE_PROVIDER_SITE_OTHER): Payer: PPO | Admitting: Cardiovascular Disease

## 2017-12-06 VITALS — BP 122/70 | HR 90 | Ht 64.0 in | Wt 272.0 lb

## 2017-12-06 DIAGNOSIS — I25118 Atherosclerotic heart disease of native coronary artery with other forms of angina pectoris: Secondary | ICD-10-CM | POA: Diagnosis not present

## 2017-12-06 DIAGNOSIS — R9439 Abnormal result of other cardiovascular function study: Secondary | ICD-10-CM | POA: Diagnosis not present

## 2017-12-06 DIAGNOSIS — I10 Essential (primary) hypertension: Secondary | ICD-10-CM

## 2017-12-06 DIAGNOSIS — R079 Chest pain, unspecified: Secondary | ICD-10-CM | POA: Diagnosis not present

## 2017-12-06 DIAGNOSIS — I350 Nonrheumatic aortic (valve) stenosis: Secondary | ICD-10-CM

## 2017-12-06 DIAGNOSIS — E785 Hyperlipidemia, unspecified: Secondary | ICD-10-CM

## 2017-12-06 DIAGNOSIS — E119 Type 2 diabetes mellitus without complications: Secondary | ICD-10-CM | POA: Diagnosis not present

## 2017-12-06 DIAGNOSIS — Z794 Long term (current) use of insulin: Secondary | ICD-10-CM

## 2017-12-06 DIAGNOSIS — IMO0001 Reserved for inherently not codable concepts without codable children: Secondary | ICD-10-CM

## 2017-12-06 NOTE — Progress Notes (Signed)
SUBJECTIVE: The patient presents for follow-up of presumed coronary artery disease as well as hypertension and aortic stenosis.  The patient denies any symptoms of chest pain, palpitations, lightheadedness, dizziness, leg swelling, orthopnea, PND, and syncope.  She seldom has shortness of breath.  She has been busy taking care of her grandchild.  She works as a Quarry manager.       Review of Systems: As per "subjective", otherwise negative.  Allergies  Allergen Reactions  . Erythromycin Nausea And Vomiting    Current Outpatient Medications  Medication Sig Dispense Refill  . acetaminophen (TYLENOL) 650 MG CR tablet Take 1,300 mg by mouth 2 (two) times daily.    [name] amLODipine (NORVASC) 5 MG tablet Take 1 tablet (5 mg total) by mouth daily. 90 tablet 3  . aspirin EC 81 MG tablet Take 81 mg by mouth daily.    [name] atorvastatin (LIPITOR) 40 MG tablet Take 1 tablet (40 mg total) by mouth daily. 90 tablet 3  . B-D UF III MINI PEN NEEDLES 31G X 5 MM MISC USE WITH LANTUS PEN 100 each 0  . Cyanocobalamin (VITAMIN B-12 PO) Take 1 tablet by mouth daily.    . diphenhydrAMINE (BENADRYL ALLERGY) 25 MG tablet Take 25 mg by mouth every 6 (six) hours as needed for allergies.     . DULoxetine (CYMBALTA) 30 MG capsule TAKE 1 CAPSULE BY MOUTH ONCE DAILY 30 capsule 1  . gabapentin (NEURONTIN) 300 MG capsule TAKE 2 CAPSULES BY MOUTH THREE TIMES DAILY 180 capsule 1  . hydrochlorothiazide (HYDRODIURIL) 25 MG tablet Take 25 mg by mouth daily.   1  . insulin glargine (LANTUS) 100 UNIT/ML injection Inject 55 Units into the skin at bedtime.      [name] losartan (COZAAR) 100 MG tablet Take 1 tablet (100 mg total) by mouth daily. 90 tablet 1  . metFORMIN (GLUCOPHAGE) 1000 MG tablet TAKE 1 TABLET BY MOUTH TWICE DAILY 60 tablet 3  . Misc Natural Products (ADVANCED JOINT RELIEF PO) Take 1 tablet by mouth daily.    . nitroGLYCERIN (NITROSTAT) 0.4 MG SL tablet Place 1 tablet (0.4 mg total) under the tongue every 5 (five)  minutes as needed. 25 tablet 3  . potassium chloride SA (K-DUR,KLOR-CON) 20 MEQ tablet Take 1 tablet (20 mEq total) by mouth daily. 90 tablet 1  . metoprolol tartrate (LOPRESSOR) 25 MG tablet Take 1 tablet (25 mg total) by mouth 2 (two) times daily. 180 tablet 3   No current facility-administered medications for this visit.     Past Medical History:  Diagnosis Date  . Anemia   . Arthritis   . Diabetes mellitus   . GERD (gastroesophageal reflux disease)   . Heart murmur   . Hyperlipidemia   . Hypertension   . Neuropathy   . PONV (postoperative nausea and vomiting)   . Sarcoidosis     Past Surgical History:  Procedure Laterality Date  . CESAREAN SECTION    . CHOLECYSTECTOMY    . COLONOSCOPY WITH PROPOFOL N/A 07/06/2017   Procedure: COLONOSCOPY WITH PROPOFOL;  Surgeon: Daneil Dolin, MD;  [facility];  Service: Endoscopy;  Laterality: N/A;  8:30am  . MASTECTOMY, PARTIAL Left    benign  . POLYPECTOMY  07/06/2017   Procedure: POLYPECTOMY;  Surgeon: Daneil Dolin, MD;  Location: [facility];  Service: Endoscopy;;  cecal polyp cs, ascending colon polyp hs  . TUBAL LIGATION      Social History   Socioeconomic History  .  Marital status: Single    Spouse name: Not on file  . Number of children: Not on file  . Years of education: Not on file  . Highest education level: Not on file  Occupational History  . Not on file  Social Needs  . Financial resource strain: Not on file  . Food insecurity:    Worry: Not on file    Inability: Not on file  . Transportation needs:    Medical: Not on file    Non-medical: Not on file  Tobacco Use  . Smoking status: Never Smoker  . Smokeless tobacco: Never Used  Substance and Sexual Activity  . Alcohol use: No  . Drug use: No  . Sexual activity: Not Currently    Birth control/protection: Post-menopausal  Lifestyle  . Physical activity:    Days per week: Not on file    Minutes per session: Not on file  . Stress: Not on  file  Relationships  . Social connections:    Talks on phone: Not on file    Gets together: Not on file    Attends religious service: Not on file    Active member of club or organization: Not on file    Attends meetings of clubs or organizations: Not on file    Relationship status: Not on file  . Intimate partner violence:    Fear of current or ex partner: Not on file    Emotionally abused: Not on file    Physically abused: Not on file    Forced sexual activity: Not on file  Other Topics Concern  . Not on file  Social History Narrative   Lives in [place], [place]. 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     Vitals:   12/06/17 1546  BP: 122/70  Pulse: 90  SpO2: 96%  Weight: 272 lb (123.4 kg)  Height: 5\' 4"  (1.626 m)    Wt Readings from Last 3 Encounters:  12/06/17 272 lb (123.4 kg)  10/06/17 262 lb 1.9 oz (118.9 kg)  08/30/17 272 lb (123.4 kg)     PHYSICAL EXAM General: NAD HEENT: Normal. Neck: No JVD, no thyromegaly. Lungs: Clear to auscultation bilaterally with normal respiratory effort. CV: Regular rate and rhythm, normal S1/S2, no N8/G9, 2/6 systolicmurmur over RUSB. No pretibial or periankle edema. Abdomen: Soft, nontender, no distention.  Neurologic: Alert and oriented.  Psych: Normal affect. Skin: Normal. Musculoskeletal: No gross deformities.    ECG: Reviewed above under Subjective   Labs: Lab Results  Component Value Date/Time   K 4.2 06/30/2017 03:12 PM   BUN 18 06/30/2017 03:12 PM   CREATININE 0.92 06/30/2017 03:12 PM   CREATININE 0.94 06/12/2017 07:19 AM   ALT 13 04/24/2017 07:16 AM   TSH 1.22 11/17/2015 02:43 PM   HGB 11.0 (L) 06/30/2017 03:12 PM     Lipids: Lab Results  Component Value Date/Time   LDLCALC 61 04/24/2017 07:16 AM   CHOL 132 04/24/2017 07:16 AM   TRIG 147 04/24/2017 07:16 AM   HDL 48 (L) 04/24/2017 07:16 AM       ASSESSMENT AND PLAN:  1. Aortic stenosis: Symptomatically  stable.  This is mild in severity with a mean gradient of 13 mmHg. I will monitor with clinical surveillance and echocardiography.  2. Hypertension: Blood pressure is normal today.  She is on losartan and hydrochlorothiazide.  No changes to therapy.  3. Hyperlipidemia: Lipids previously reviewed. Continue Lipitor 40 mg.  4. Insulin-dependent diabetes mellitus: Currently on insulin and metformin. HbA1c 6.5% on 04/24/17.  5. Chest pain with abnormal nuclear stress test/presumed coronary artery disease: Symptomatically improved with the addition of metoprolol.  She has several cardiovascular risk factors with both symptoms and stress test results suggestive of ischemic heart disease.  She is also currently on aspirin and statin therapy.    Disposition: Follow up 6 months   Kate Sable, M.D., F.A.C.C.

## 2017-12-06 NOTE — Patient Instructions (Addendum)
Your physician wants you to follow-up in: 6 months with Dr.Koneswaran You will receive a reminder letter in the mail two months in advance. If you don't receive a letter, please call our office to schedule the follow-up appointment.     Your physician recommends that you continue on your current medications as directed. Please refer to the Current Medication list given to you today.     If you need a refill on your cardiac medications before your next appointment, please call your pharmacy.      No lab results or tests today      Thank you for choosing [facility] !

## 2017-12-08 ENCOUNTER — Ambulatory Visit: Payer: BLUE CROSS/BLUE SHIELD | Admitting: Family Medicine

## 2017-12-08 DIAGNOSIS — Z113 Encounter for screening for infections with a predominantly sexual mode of transmission: Secondary | ICD-10-CM | POA: Diagnosis not present

## 2017-12-08 DIAGNOSIS — E1169 Type 2 diabetes mellitus with other specified complication: Secondary | ICD-10-CM | POA: Diagnosis not present

## 2017-12-08 DIAGNOSIS — E785 Hyperlipidemia, unspecified: Secondary | ICD-10-CM | POA: Diagnosis not present

## 2017-12-09 LAB — LIPID PANEL
Cholesterol: 144 mg/dL (ref <200)
HDL: 42 mg/dL — ABNORMAL LOW (ref >50)
LDL Cholesterol (Calc): 69 mg/dL (calc)
Non-HDL Cholesterol (Calc): 102 mg/dL (calc) (ref <130)
Total CHOL/HDL Ratio: 3.4 (calc) (ref <5.0)
Triglycerides: 254 mg/dL — ABNORMAL HIGH (ref <150)

## 2017-12-09 LAB — COMPLETE METABOLIC PANEL WITH GFR
AG Ratio: 1.2 (calc) (ref 1.0–2.5)
ALT: 13 U/L (ref 6–29)
AST: 11 U/L (ref 10–35)
Albumin: 3.7 g/dL (ref 3.6–5.1)
Alkaline phosphatase (APISO): 83 U/L (ref 33–130)
BUN/Creatinine Ratio: 14 (calc) (ref 6–22)
BUN: 15 mg/dL (ref 7–25)
CO2: 25 mmol/L (ref 20–32)
Calcium: 9.2 mg/dL (ref 8.6–10.4)
Chloride: 105 mmol/L (ref 98–110)
Creat: 1.04 mg/dL — ABNORMAL HIGH (ref 0.50–0.99)
GFR, Est African American: 65 mL/min/{1.73_m2} (ref >=60)
GFR, Est Non African American: 56 mL/min/{1.73_m2} — ABNORMAL LOW (ref >=60)
Globulin: 3.1 g/dL (calc) (ref 1.9–3.7)
Glucose, Bld: 196 mg/dL — ABNORMAL HIGH (ref 65–99)
Potassium: 4.2 mmol/L (ref 3.5–5.3)
Sodium: 139 mmol/L (ref 135–146)
Total Bilirubin: 0.4 mg/dL (ref 0.2–1.2)
Total Protein: 6.8 g/dL (ref 6.1–8.1)

## 2017-12-09 LAB — HEMOGLOBIN A1C
Hgb A1c MFr Bld: 9.1 % of total Hgb — ABNORMAL HIGH (ref <5.7)
Mean Plasma Glucose: 214 (calc)
eAG (mmol/L): 11.9 (calc)

## 2017-12-10 ENCOUNTER — Other Ambulatory Visit: Payer: Self-pay | Admitting: Family Medicine

## 2017-12-11 ENCOUNTER — Other Ambulatory Visit: Payer: Self-pay | Admitting: Family Medicine

## 2017-12-11 DIAGNOSIS — I10 Essential (primary) hypertension: Secondary | ICD-10-CM

## 2017-12-13 ENCOUNTER — Other Ambulatory Visit: Payer: Self-pay

## 2017-12-13 ENCOUNTER — Encounter: Payer: Self-pay | Admitting: Family Medicine

## 2017-12-13 ENCOUNTER — Ambulatory Visit (INDEPENDENT_AMBULATORY_CARE_PROVIDER_SITE_OTHER): Payer: BLUE CROSS/BLUE SHIELD | Admitting: Family Medicine

## 2017-12-13 VITALS — BP 136/70 | HR 86 | Temp 98.8°F | Resp 12 | Ht 64.0 in | Wt 272.0 lb

## 2017-12-13 DIAGNOSIS — E1140 Type 2 diabetes mellitus with diabetic neuropathy, unspecified: Secondary | ICD-10-CM

## 2017-12-13 DIAGNOSIS — E1169 Type 2 diabetes mellitus with other specified complication: Secondary | ICD-10-CM | POA: Diagnosis not present

## 2017-12-13 DIAGNOSIS — Z794 Long term (current) use of insulin: Secondary | ICD-10-CM

## 2017-12-13 DIAGNOSIS — I10 Essential (primary) hypertension: Secondary | ICD-10-CM

## 2017-12-13 DIAGNOSIS — E785 Hyperlipidemia, unspecified: Secondary | ICD-10-CM

## 2017-12-13 DIAGNOSIS — E1159 Type 2 diabetes mellitus with other circulatory complications: Secondary | ICD-10-CM | POA: Diagnosis not present

## 2017-12-13 DIAGNOSIS — I152 Hypertension secondary to endocrine disorders: Secondary | ICD-10-CM

## 2017-12-13 DIAGNOSIS — F321 Major depressive disorder, single episode, moderate: Secondary | ICD-10-CM

## 2017-12-13 MED ORDER — DULOXETINE HCL 30 MG PO CPEP: 30.0000 mg | ORAL_CAPSULE | Freq: Every day | ORAL | 1 refills | Status: DC

## 2017-12-13 MED ORDER — INSULIN GLARGINE 100 UNIT/ML ~~LOC~~ SOLN: 60.0000 [IU] | Freq: Every day | SUBCUTANEOUS | 1 refills | Status: DC

## 2017-12-13 NOTE — Progress Notes (Signed)
Patient ID: Ann Roberson, female    DOB: November 12, 1950, 67 y.o.   MRN: [id number]  Chief Complaint  Patient presents with  . Diabetes    follow up  . Hypertension    Allergies Erythromycin  Subjective:   Ann Roberson is a 67 y.o. female who presents to [facility] today.  HPI Ms. Ann Roberson presents today for follow-up.  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  Therefore, she has been at home much more during the day.  She does report that she has not been following a diabetic diet.  She reports that since she has been home she has been eating a lot more food and been drinking a lot of sodas.  She previously followed a strict diet and did not eat hardly anything she was not supposed to.  She recently came to the lab and had her blood work done prior to the appointment.  Her A1c was dramatically increased from her last reading.  Her triglycerides were also elevated.  She reports that she has been using one.  She takes 55 units nightly.  She is also on metformin.  She reports that usually her sugars have been running for the past several weeks around 200.  She has not had any hypoglycemic episodes.  She reports that she feels rather well despite her blood sugars being high.  She has been taking her blood pressure medication without any problems.  She has also gained 10 pounds since she was last seen here.  However, she reports that her knee pain is a little bit better and her mood is better.  She has been taking the Cymbalta 30 mg a day.  She reports she does not feel down, depressed, or hopeless.  She is sleeping better.  She has not had any side effects with the Cymbalta.  Still has chronic knee pain and followed by orthopedics.  She would like to have a knee replacement surgery but is not a candidate until she loses weight.  She reports she absolutely does not want to see the wound by them numerous  times in the past.  She reports that she knows what she is supposed to eat but over the past couple months with her decreased hours at work she is just been eating whatever she wanted.  She has a follow-up with GI coming up for repeat colonoscopy.  She denies any nausea, vomiting, abdominal pain, melena, bright red blood per rectum, or hematochezia.   Past Medical History:  Diagnosis Date  . Anemia   . Arthritis   . Diabetes mellitus   . GERD (gastroesophageal reflux disease)   . Heart murmur   . Hyperlipidemia   . Hypertension   . Neuropathy   . PONV (postoperative nausea and vomiting)   . Sarcoidosis     Past Surgical History:  Procedure Laterality Date  . CESAREAN SECTION    . CHOLECYSTECTOMY    . COLONOSCOPY WITH PROPOFOL N/A 07/06/2017   Procedure: COLONOSCOPY WITH PROPOFOL;  Surgeon: Daneil Dolin, MD;  [facility];  Service: Endoscopy;  Laterality: N/A;  8:30am  . MASTECTOMY, PARTIAL Left    benign  . POLYPECTOMY  07/06/2017   Procedure: POLYPECTOMY;  Surgeon: Daneil Dolin, MD;  [facility];  Service: Endoscopy;;  cecal polyp cs, ascending colon polyp hs  . TUBAL LIGATION  Family History  Problem Relation Age of Onset  . Asthma Brother   . Cancer Mother   . Cancer Father   . Cancer Brother   . Diabetes Brother   . Allergic rhinitis Neg Hx   . Angioedema Neg Hx   . Atopy Neg Hx   . Eczema Neg Hx   . Immunodeficiency Neg Hx   . Urticaria Neg Hx      Social History   Socioeconomic History  . Marital status: Single    Spouse name: Not on file  . Number of children: Not on file  . Years of education: Not on file  . Highest education level: Not on file  Occupational History  . Not on file  Social Needs  . Financial resource strain: Not on file  . Food insecurity:    Worry: Not on file    Inability: Not on file  . Transportation needs:    Medical: Not on file    Non-medical: Not on file  Tobacco Use  . Smoking status:  Never Smoker  . Smokeless tobacco: Never Used  Substance and Sexual Activity  . Alcohol use: No  . Drug use: No  . Sexual activity: Not Currently    Birth control/protection: Post-menopausal  Lifestyle  . Physical activity:    Days per week: Not on file    Minutes per session: Not on file  . Stress: Not on file  Relationships  . Social connections:    Talks on phone: Not on file    Gets together: Not on file    Attends religious service: Not on file    Active member of club or organization: Not on file    Attends meetings of clubs or organizations: Not on file    Relationship status: Not on file  Other Topics Concern  . Not on file  Social History Narrative   Lives in [place], [place]. 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    Review of Systems  Constitutional: Negative for activity change, appetite change, diaphoresis, fatigue, fever and unexpected weight change.  HENT: Negative for trouble swallowing.   Eyes: Negative for photophobia and visual disturbance.  Respiratory: Negative for cough, chest tightness and shortness of breath.   Cardiovascular: Negative for chest pain, palpitations and leg swelling.  Gastrointestinal: Negative for abdominal pain, nausea and vomiting.  Endocrine: Negative for polyphagia and polyuria.  Genitourinary: Negative for dysuria, frequency and urgency.  Musculoskeletal:       Still reports she has bilateral chronic knee pain.  Skin: Negative for rash.  Neurological: Negative for dizziness, syncope, light-headedness and headaches.  Hematological: Negative for adenopathy.  Psychiatric/Behavioral: Negative for decreased concentration, dysphoric mood, self-injury, sleep disturbance and suicidal ideas. The patient is not nervous/anxious.    Current Outpatient Medications on File Prior to Visit  Medication Sig Dispense Refill  . acetaminophen (TYLENOL) 650 MG CR tablet Take 1,300 mg by mouth 2 (two) times daily.     [name] amLODipine (NORVASC) 5 MG tablet Take 1 tablet (5 mg total) by mouth daily. 90 tablet 3  . aspirin EC 81 MG tablet Take 81 mg by mouth daily.    . B-D UF III MINI PEN NEEDLES 31G X 5 MM MISC USE WITH LANTUS PEN 100 each 0  . Cyanocobalamin (VITAMIN B-12 PO) Take 1 tablet by mouth daily.    . diphenhydrAMINE (BENADRYL ALLERGY) 25 MG tablet Take 25 mg by mouth every 6 (  six) hours as needed for allergies.     . DULoxetine (CYMBALTA) 30 MG capsule TAKE 1 CAPSULE BY MOUTH ONCE DAILY 30 capsule 1  . gabapentin (NEURONTIN) 300 MG capsule TAKE 2 CAPSULES BY MOUTH THREE TIMES DAILY 180 capsule 1  . hydrochlorothiazide (HYDRODIURIL) 25 MG tablet Take 25 mg by mouth daily.   1  . insulin glargine (LANTUS) 100 UNIT/ML injection Inject 55 Units into the skin at bedtime.      [name] losartan (COZAAR) 100 MG tablet TAKE 1 TABLET BY MOUTH ONCE DAILY 90 tablet 0  . metFORMIN (GLUCOPHAGE) 1000 MG tablet TAKE 1 TABLET BY MOUTH TWICE DAILY 60 tablet 0  . metoprolol tartrate (LOPRESSOR) 25 MG tablet Take 1 tablet (25 mg total) by mouth 2 (two) times daily. 180 tablet 3  . Misc Natural Products (ADVANCED JOINT RELIEF PO) Take 1 tablet by mouth daily.    . nitroGLYCERIN (NITROSTAT) 0.4 MG SL tablet Place 1 tablet (0.4 mg total) under the tongue every 5 (five) minutes as needed. 25 tablet 3  . potassium chloride SA (K-DUR,KLOR-CON) 20 MEQ tablet Take 1 tablet (20 mEq total) by mouth daily. 90 tablet 1  . atorvastatin (LIPITOR) 40 MG tablet Take 1 tablet (40 mg total) by mouth daily. 90 tablet 3   No current facility-administered medications on file prior to visit.      Objective:   BP 136/70 (BP Location: Left Arm, Patient Position: Sitting, Cuff Size: Large)   Pulse 86   Temp 98.8 F (37.1 C) (Temporal)   Resp 12   Ht 5\' 4"  (1.626 m)   Wt 272 lb (123.4 kg)   SpO2 94% Comment: room air  BMI 46.69 kg/m   Physical Exam  Constitutional: She is oriented to person, place, and time. She appears well-developed  and well-nourished. No distress.  HENT:  Head: Normocephalic and atraumatic.  Eyes: Pupils are equal, round, and reactive to light.  Neck: Normal range of motion. Neck supple. No thyromegaly present.  Cardiovascular: Normal rate, regular rhythm and normal heart sounds.  Pulmonary/Chest: Effort normal and breath sounds normal. No respiratory distress.  Neurological: She is alert and oriented to person, place, and time. No cranial nerve deficit.  Skin: Skin is warm and dry.  Psychiatric: She has a normal mood and affect. Her behavior is normal. Judgment and thought content normal.  Nursing note and vitals reviewed.  Depression screen [facility] 2/9 12/13/2017 10/06/2017 08/30/2017 07/20/2017 04/06/2017  Decreased Interest 0 1 0 0 0  Down, Depressed, Hopeless 0 0 2 0 1  PHQ - 2 Score 0 1 2 0 1  Altered sleeping - 0 2 - -  Tired, decreased energy - 1 3 - -  Change in appetite - 0 0 - -  Feeling bad or failure about yourself  - 1 0 - -  Trouble concentrating - 0 0 - -  Moving slowly or fidgety/restless - 0 0 - -  Suicidal thoughts - 0 0 - -  PHQ-9 Score - 3 7 - -  Difficult doing work/chores - Not difficult at all Somewhat difficult - -    Assessment and Plan  1. Type 2 diabetes mellitus with other specified complication, with long-term current use of insulin ([place]) Diabetes uncontrolled at this time.  Labs reviewed with patient today.  Hemoglobin A1c approximately 9.5%.  This is a dramatic increase from previous 6.5% reading 3 months ago.  This can be contributed to dietary indiscretion.  She has also gained significant amount  of weight. Today we discussed dietary changes, cutting out sodas, exercise, and lifestyle modifications to improve her health. She is asked to start checking her blood sugars and recording them.  She will check sugars fasting in the morning and 2 hours after meals.  She is to call early on next week with her readings and will adjust her insulin again as needed.  She correlates  this change to dietary indiscretion.  She defers seeing a nutritionist.  She also defers seeing endocrinologist at this time and believes that she can get this under control.  She would like to go back to eating healthy and not add an additional medication at this time. - insulin glargine (LANTUS) 100 UNIT/ML injection; Inject 0.6 mLs (60 Units total) into the skin at bedtime.  Dispense: 10 mL; Refill: 1  2. Hypertension associated with diabetes ([place]) Stable.  Continue current medications as directed.  3. Hyperlipidemia associated with type 2 diabetes mellitus ([place]) LDL at goal.  Did discuss her panel today.  Triglycerides are elevated which is most likely secondary to her dietary indiscretion.  Continue statin. Hyperlipidemia and the associated risk of ASCVD were discussed today. Primary vs. Secondary prevention of ASCVD were discussed and how it relates to patient morbidity, mortality, and quality of life. Shared decision making with patient including the risks of statins vs.benefits of ASCVD risk reduction discussed.  Risks of stains discussed including myopathy, rhabdomyoloysis, liver problems, increased risk of diabetes discussed. We discussed heart healthy diet, lifestyle modifications, risk factor modifications, and adherence to the recommended treatment plan. We discussed the need to periodically monitor lipid panel and liver function tests while on statin therapy.    4. Morbid obesity due to excess calories ([place]) Weight gain was discussed with patient today.  Dietary modifications discussed.  We also discussed how her weight affects her joints and worsens her knee pain.  5. Depression, major, single episode, moderate ([facility]) Improved.  Continue current medications.  Call with questions or concerns. Patient has had improvement in both her mood and her pain.  To any medications.  Patient has protective factors of family and community support.  Patient reports that family believes is behaving  rationally. Patient displays problem solving skills.   Patient specifically denies suicide ideation. Patient has access/information to healthcare contacts if situation or mood changes where patient is a risk to self or others or mood becomes unstable.   During the encounter, the patient had good eye contact and firm handshake regarding safety contract and agreement to seek help if mood worsens and not to harm self.   Patient understands the treatment plan and is in agreement. Agrees to keep follow up and call prior or return to clinic if needed.   - DULoxetine (CYMBALTA) 30 MG capsule; Take 1 capsule (30 mg total) by mouth daily.  Dispense: 90 capsule; Refill: 1 Call office back with blood sugars.  Go ahead and make sure has an appointment to follow-up with new PCP office within the next 1 to 2 months.  Compliance with diet and recommended treatment program discussed. No follow-ups on file. Caren Macadam, MD 12/13/2017

## 2017-12-28 ENCOUNTER — Other Ambulatory Visit: Payer: Self-pay | Admitting: Family Medicine

## 2018-01-09 ENCOUNTER — Telehealth: Payer: Self-pay

## 2018-01-09 ENCOUNTER — Ambulatory Visit (INDEPENDENT_AMBULATORY_CARE_PROVIDER_SITE_OTHER): Payer: BLUE CROSS/BLUE SHIELD | Admitting: Gastroenterology

## 2018-01-09 ENCOUNTER — Encounter: Payer: Self-pay | Admitting: Gastroenterology

## 2018-01-09 ENCOUNTER — Other Ambulatory Visit: Payer: Self-pay

## 2018-01-09 DIAGNOSIS — D126 Benign neoplasm of colon, unspecified: Secondary | ICD-10-CM | POA: Diagnosis not present

## 2018-01-09 NOTE — Progress Notes (Signed)
Primary Care Physician:  Caren Macadam, MD  Primary Gastroenterologist:  Garfield Cornea, MD   Chief Complaint  Patient presents with  . Colonoscopy    needs repeat tcs    HPI:  Ann Roberson is a 67 y.o. female here to schedule six-month follow-up colonoscopy.  Colonoscopy back in April for high risk colon cancer surveillance with personal history of colon polyps, showed 5 semi-pedunculated polyps in the ascending colon and cecum measuring 8 to 16 mm.  The largest polyp could not be completely removed, status post inking.  Pathology revealed tubulovillous adenoma with no high-grade dysplasia in the ascending colon polyp, other polyps tubular adenomas.  Recommend a six-month follow-up colonoscopy with general anesthesia/intubation to control respirations so the polyp that was incompletely removed could be completely resected with better control.  Clinically she does well.  Denies any constipation, diarrhea, melena, rectal bleeding.  No abdominal pain.  No upper GI complaints.  Current Outpatient Medications  Medication Sig Dispense Refill  . acetaminophen (TYLENOL) 650 MG CR tablet Take 1,300 mg by mouth 2 (two) times daily.    [name] amLODipine (NORVASC) 5 MG tablet Take 1 tablet (5 mg total) by mouth daily. 90 tablet 3  . aspirin EC 81 MG tablet Take 81 mg by mouth daily.    [name] atorvastatin (LIPITOR) 40 MG tablet Take 1 tablet (40 mg total) by mouth daily. 90 tablet 3  . B-D UF III MINI PEN NEEDLES 31G X 5 MM MISC USE WITH LANTUS PEN 100 each 0  . DULoxetine (CYMBALTA) 30 MG capsule Take 1 capsule (30 mg total) by mouth daily. 90 capsule 1  . gabapentin (NEURONTIN) 300 MG capsule TAKE 2 CAPSULES BY MOUTH THREE TIMES DAILY 180 capsule 1  . hydrochlorothiazide (HYDRODIURIL) 25 MG tablet Take 25 mg by mouth daily.   1  . insulin glargine (LANTUS) 100 UNIT/ML injection Inject 0.6 mLs (60 Units total) into the skin at bedtime. 10 mL 1  . losartan (COZAAR) 100 MG tablet TAKE 1 TABLET BY MOUTH  ONCE DAILY 90 tablet 0  . metFORMIN (GLUCOPHAGE) 1000 MG tablet TAKE 1 TABLET BY MOUTH TWICE DAILY 60 tablet 0  . metoprolol tartrate (LOPRESSOR) 25 MG tablet Take 1 tablet (25 mg total) by mouth 2 (two) times daily. 180 tablet 3  . nitroGLYCERIN (NITROSTAT) 0.4 MG SL tablet Place 1 tablet (0.4 mg total) under the tongue every 5 (five) minutes as needed. 25 tablet 3  . potassium chloride SA (K-DUR,KLOR-CON) 20 MEQ tablet Take 1 tablet (20 mEq total) by mouth daily. 90 tablet 1   No current facility-administered medications for this visit.     Allergies as of 01/09/2018 - Review Complete 01/09/2018  Allergen Reaction Noted  . Erythromycin Nausea And Vomiting 01/29/2011    Past Medical History:  Diagnosis Date  . Anemia   . Arthritis   . Diabetes mellitus   . GERD (gastroesophageal reflux disease)   . Heart murmur   . Hyperlipidemia   . Hypertension   . Neuropathy   . PONV (postoperative nausea and vomiting)   . Sarcoidosis     Past Surgical History:  Procedure Laterality Date  . CESAREAN SECTION    . CHOLECYSTECTOMY    . COLONOSCOPY WITH PROPOFOL N/A 07/06/2017   Dr. Gala Romney: Diverticulosis.five 8 to 16 mm polyps in the ascending colon and in the cecum, removed with hot snare.  Largest polyp could not be removed completely, status post inking.  Pathology revealed cecal tubular adenoma, a sending  colon polyps with multiple fragments of tubular adenomas, tubulovillous adenoma, no high-grade dysplasia or malignancy.  Tubular adenomas removed from the rectum as   . MASTECTOMY, PARTIAL Left    benign  . POLYPECTOMY  07/06/2017   Procedure: POLYPECTOMY;  Surgeon: Daneil Dolin, MD;  Location: [facility];  Service: Endoscopy;;  cecal polyp cs, ascending colon polyp hs  . TUBAL LIGATION      Family History  Problem Relation Age of Onset  . Asthma Brother   . Cancer Mother   . Cancer Father   . Cancer Brother   . Diabetes Brother   . Allergic rhinitis Neg Hx   . Angioedema Neg  Hx   . Atopy Neg Hx   . Eczema Neg Hx   . Immunodeficiency Neg Hx   . Urticaria Neg Hx     Social History   Socioeconomic History  . Marital status: Single    Spouse name: Not on file  . Number of children: Not on file  . Years of education: Not on file  . Highest education level: Not on file  Occupational History  . Not on file  Social Needs  . Financial resource strain: Not on file  . Food insecurity:    Worry: Not on file    Inability: Not on file  . Transportation needs:    Medical: Not on file    Non-medical: Not on file  Tobacco Use  . Smoking status: Never Smoker  . Smokeless tobacco: Never Used  Substance and Sexual Activity  . Alcohol use: No  . Drug use: No  . Sexual activity: Not Currently    Birth control/protection: Post-menopausal  Lifestyle  . Physical activity:    Days per week: Not on file    Minutes per session: Not on file  . Stress: Not on file  Relationships  . Social connections:    Talks on phone: Not on file    Gets together: Not on file    Attends religious service: Not on file    Active member of club or organization: Not on file    Attends meetings of clubs or organizations: Not on file    Relationship status: Not on file  . Intimate partner violence:    Fear of current or ex partner: Not on file    Emotionally abused: Not on file    Physically abused: Not on file    Forced sexual activity: Not on file  Other Topics Concern  . Not on file  Social History Narrative   Lives in [place], [place]. 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      ROS:  General: Negative for anorexia, weight loss, fever, chills, fatigue, weakness. Eyes: Negative for vision changes.  ENT: Negative for hoarseness, difficulty swallowing , nasal congestion. CV: Negative for chest pain, angina, palpitations, dyspnea on exertion, peripheral edema.  Respiratory: Negative for dyspnea at rest, dyspnea on exertion, cough,  sputum, wheezing.  GI: See history of present illness. GU:  Negative for dysuria, hematuria, urinary incontinence, urinary frequency, nocturnal urination.  MS: Positive left knee pain Derm: Negative for rash or itching.  Neuro: Negative for weakness, abnormal sensation, seizure, frequent headaches, memory loss, confusion.  Psych: Negative for anxiety, depression, suicidal ideation, hallucinations.  Endo: Negative for unusual weight change.  Heme: Negative for bruising or bleeding. Allergy: Negative for rash or hives.    Physical Examination:  BP (!) 145/84  Pulse 77   Temp (!) 97.5 F (36.4 C) (Oral)   Ht 5\' 5"  (1.651 m)   Wt 271 lb 6.4 oz (123.1 kg)   BMI 45.16 kg/m    General: Well-nourished, well-developed in no acute distress.  Head: Normocephalic, atraumatic.   Eyes: Conjunctiva pink, no icterus. Mouth: Oropharyngeal mucosa moist and pink , no lesions erythema or exudate. Neck: Supple without thyromegaly, masses, or lymphadenopathy.  Lungs: Clear to auscultation bilaterally.  Heart: Regular rate and rhythm, no murmurs rubs or gallops.  Abdomen: Bowel sounds are normal, nontender, nondistended, no hepatosplenomegaly or masses, no abdominal bruits or    hernia , no rebound or guarding.   Rectal: Not performed Extremities: No lower extremity edema. No clubbing or deformities.  Neuro: Alert and oriented x 4 , grossly normal neurologically.  Skin: Warm and dry, no rash or jaundice.   Psych: Alert and cooperative, normal mood and affect.  Labs: Lab Results  Component Value Date   CREATININE 1.04 (H) 12/08/2017   BUN 15 12/08/2017   NA 139 12/08/2017   K 4.2 12/08/2017   CL 105 12/08/2017   CO2 25 12/08/2017   Lab Results  Component Value Date   ALT 13 12/08/2017   AST 11 12/08/2017   ALKPHOS 83 12/08/2017   BILITOT 0.4 12/08/2017   Lab Results  Component Value Date   WBC 6.3 06/30/2017   HGB 11.0 (L) 06/30/2017   HCT 36.2 06/30/2017   MCV 88.1 06/30/2017    PLT 277 06/30/2017   Lab Results  Component Value Date   IRON 57 06/12/2017   TIBC 265 06/12/2017   FERRITIN 208 06/12/2017    Imaging Studies: No results found.

## 2018-01-09 NOTE — Assessment & Plan Note (Addendum)
67 year old female with incomplete resection of tubulovillous adenoma the colon as outlined above.  She is being brought back to complete polypectomy, plans for general anesthesia with intubation in order to control respirations as the approach on this colon polyp was very difficult and respirations interfered signifcantly.  I have discussed the risks, alternatives, benefits with regards to but not limited to the risk of reaction to medication, bleeding, infection, perforation and the patient is agreeable to proceed. Written consent to be obtained.

## 2018-01-09 NOTE — H&P (View-Only) (Signed)
Primary Care Physician:  Caren Macadam, MD  Primary Gastroenterologist:  Garfield Cornea, MD   Chief Complaint  Patient presents with  . Colonoscopy    needs repeat tcs    HPI:  Ann Roberson is a 67 y.o. female here to schedule six-month follow-up colonoscopy.  Colonoscopy back in April for high risk colon cancer surveillance with personal history of colon polyps, showed 5 semi-pedunculated polyps in the ascending colon and cecum measuring 8 to 16 mm.  The largest polyp could not be completely removed, status post inking.  Pathology revealed tubulovillous adenoma with no high-grade dysplasia in the ascending colon polyp, other polyps tubular adenomas.  Recommend a six-month follow-up colonoscopy with general anesthesia/intubation to control respirations so the polyp that was incompletely removed could be completely resected with better control.  Clinically she does well.  Denies any constipation, diarrhea, melena, rectal bleeding.  No abdominal pain.  No upper GI complaints.  Current Outpatient Medications  Medication Sig Dispense Refill  . acetaminophen (TYLENOL) 650 MG CR tablet Take 1,300 mg by mouth 2 (two) times daily.    [name] amLODipine (NORVASC) 5 MG tablet Take 1 tablet (5 mg total) by mouth daily. 90 tablet 3  . aspirin EC 81 MG tablet Take 81 mg by mouth daily.    [name] atorvastatin (LIPITOR) 40 MG tablet Take 1 tablet (40 mg total) by mouth daily. 90 tablet 3  . B-D UF III MINI PEN NEEDLES 31G X 5 MM MISC USE WITH LANTUS PEN 100 each 0  . DULoxetine (CYMBALTA) 30 MG capsule Take 1 capsule (30 mg total) by mouth daily. 90 capsule 1  . gabapentin (NEURONTIN) 300 MG capsule TAKE 2 CAPSULES BY MOUTH THREE TIMES DAILY 180 capsule 1  . hydrochlorothiazide (HYDRODIURIL) 25 MG tablet Take 25 mg by mouth daily.   1  . insulin glargine (LANTUS) 100 UNIT/ML injection Inject 0.6 mLs (60 Units total) into the skin at bedtime. 10 mL 1  . losartan (COZAAR) 100 MG tablet TAKE 1 TABLET BY MOUTH  ONCE DAILY 90 tablet 0  . metFORMIN (GLUCOPHAGE) 1000 MG tablet TAKE 1 TABLET BY MOUTH TWICE DAILY 60 tablet 0  . metoprolol tartrate (LOPRESSOR) 25 MG tablet Take 1 tablet (25 mg total) by mouth 2 (two) times daily. 180 tablet 3  . nitroGLYCERIN (NITROSTAT) 0.4 MG SL tablet Place 1 tablet (0.4 mg total) under the tongue every 5 (five) minutes as needed. 25 tablet 3  . potassium chloride SA (K-DUR,KLOR-CON) 20 MEQ tablet Take 1 tablet (20 mEq total) by mouth daily. 90 tablet 1   No current facility-administered medications for this visit.     Allergies as of 01/09/2018 - Review Complete 01/09/2018  Allergen Reaction Noted  . Erythromycin Nausea And Vomiting 01/29/2011    Past Medical History:  Diagnosis Date  . Anemia   . Arthritis   . Diabetes mellitus   . GERD (gastroesophageal reflux disease)   . Heart murmur   . Hyperlipidemia   . Hypertension   . Neuropathy   . PONV (postoperative nausea and vomiting)   . Sarcoidosis     Past Surgical History:  Procedure Laterality Date  . CESAREAN SECTION    . CHOLECYSTECTOMY    . COLONOSCOPY WITH PROPOFOL N/A 07/06/2017   Dr. Gala Romney: Diverticulosis.five 8 to 16 mm polyps in the ascending colon and in the cecum, removed with hot snare.  Largest polyp could not be removed completely, status post inking.  Pathology revealed cecal tubular adenoma, a sending  colon polyps with multiple fragments of tubular adenomas, tubulovillous adenoma, no high-grade dysplasia or malignancy.  Tubular adenomas removed from the rectum as   . MASTECTOMY, PARTIAL Left    benign  . POLYPECTOMY  07/06/2017   Procedure: POLYPECTOMY;  Surgeon: Daneil Dolin, MD;  Location: [facility];  Service: Endoscopy;;  cecal polyp cs, ascending colon polyp hs  . TUBAL LIGATION      Family History  Problem Relation Age of Onset  . Asthma Brother   . Cancer Mother   . Cancer Father   . Cancer Brother   . Diabetes Brother   . Allergic rhinitis Neg Hx   . Angioedema Neg  Hx   . Atopy Neg Hx   . Eczema Neg Hx   . Immunodeficiency Neg Hx   . Urticaria Neg Hx     Social History   Socioeconomic History  . Marital status: Single    Spouse name: Not on file  . Number of children: Not on file  . Years of education: Not on file  . Highest education level: Not on file  Occupational History  . Not on file  Social Needs  . Financial resource strain: Not on file  . Food insecurity:    Worry: Not on file    Inability: Not on file  . Transportation needs:    Medical: Not on file    Non-medical: Not on file  Tobacco Use  . Smoking status: Never Smoker  . Smokeless tobacco: Never Used  Substance and Sexual Activity  . Alcohol use: No  . Drug use: No  . Sexual activity: Not Currently    Birth control/protection: Post-menopausal  Lifestyle  . Physical activity:    Days per week: Not on file    Minutes per session: Not on file  . Stress: Not on file  Relationships  . Social connections:    Talks on phone: Not on file    Gets together: Not on file    Attends religious service: Not on file    Active member of club or organization: Not on file    Attends meetings of clubs or organizations: Not on file    Relationship status: Not on file  . Intimate partner violence:    Fear of current or ex partner: Not on file    Emotionally abused: Not on file    Physically abused: Not on file    Forced sexual activity: Not on file  Other Topics Concern  . 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      ROS:  General: Negative for anorexia, weight loss, fever, chills, fatigue, weakness. Eyes: Negative for vision changes.  ENT: Negative for hoarseness, difficulty swallowing , nasal congestion. CV: Negative for chest pain, angina, palpitations, dyspnea on exertion, peripheral edema.  Respiratory: Negative for dyspnea at rest, dyspnea on exertion, cough,  sputum, wheezing.  GI: See history of present illness. GU:  Negative for dysuria, hematuria, urinary incontinence, urinary frequency, nocturnal urination.  MS: Positive left knee pain Derm: Negative for rash or itching.  Neuro: Negative for weakness, abnormal sensation, seizure, frequent headaches, memory loss, confusion.  Psych: Negative for anxiety, depression, suicidal ideation, hallucinations.  Endo: Negative for unusual weight change.  Heme: Negative for bruising or bleeding. Allergy: Negative for rash or hives.    Physical Examination:  BP (!) 145/84  Pulse 77   Temp (!) 97.5 F (36.4 C) (Oral)   Ht 5\' 5"  (1.651 m)   Wt 271 lb 6.4 oz (123.1 kg)   BMI 45.16 kg/m    General: Well-nourished, well-developed in no acute distress.  Head: Normocephalic, atraumatic.   Eyes: Conjunctiva pink, no icterus. Mouth: Oropharyngeal mucosa moist and pink , no lesions erythema or exudate. Neck: Supple without thyromegaly, masses, or lymphadenopathy.  Lungs: Clear to auscultation bilaterally.  Heart: Regular rate and rhythm, no murmurs rubs or gallops.  Abdomen: Bowel sounds are normal, nontender, nondistended, no hepatosplenomegaly or masses, no abdominal bruits or    hernia , no rebound or guarding.   Rectal: Not performed Extremities: No lower extremity edema. No clubbing or deformities.  Neuro: Alert and oriented x 4 , grossly normal neurologically.  Skin: Warm and dry, no rash or jaundice.   Psych: Alert and cooperative, normal mood and affect.  Labs: Lab Results  Component Value Date   CREATININE 1.04 (H) 12/08/2017   BUN 15 12/08/2017   NA 139 12/08/2017   K 4.2 12/08/2017   CL 105 12/08/2017   CO2 25 12/08/2017   Lab Results  Component Value Date   ALT 13 12/08/2017   AST 11 12/08/2017   ALKPHOS 83 12/08/2017   BILITOT 0.4 12/08/2017   Lab Results  Component Value Date   WBC 6.3 06/30/2017   HGB 11.0 (L) 06/30/2017   HCT 36.2 06/30/2017   MCV 88.1 06/30/2017    PLT 277 06/30/2017   Lab Results  Component Value Date   IRON 57 06/12/2017   TIBC 265 06/12/2017   FERRITIN 208 06/12/2017    Imaging Studies: No results found.

## 2018-01-09 NOTE — Patient Instructions (Signed)
Colonoscopy as scheduled.  Please see separate instructions.

## 2018-01-09 NOTE — Telephone Encounter (Signed)
Endo scheduler advised pt would need to be 1st case on Propofol day d/t she will be done with general anesthesia. TCS for 02/08/18 moved up to 7:30am. Called and informed pt. New instructions mailed. Informed her of pre-op appt 02/02/18 at 2:45pm. Letter mailed with procedure instructions.

## 2018-01-10 NOTE — Progress Notes (Signed)
cc'ed to pcp °

## 2018-01-16 DIAGNOSIS — D860 Sarcoidosis of lung: Secondary | ICD-10-CM | POA: Diagnosis not present

## 2018-01-16 DIAGNOSIS — I119 Hypertensive heart disease without heart failure: Secondary | ICD-10-CM | POA: Diagnosis not present

## 2018-01-16 DIAGNOSIS — Z6841 Body Mass Index (BMI) 40.0 and over, adult: Secondary | ICD-10-CM | POA: Diagnosis not present

## 2018-01-16 DIAGNOSIS — I5189 Other ill-defined heart diseases: Secondary | ICD-10-CM | POA: Diagnosis not present

## 2018-01-16 DIAGNOSIS — Z794 Long term (current) use of insulin: Secondary | ICD-10-CM | POA: Diagnosis not present

## 2018-01-16 DIAGNOSIS — E785 Hyperlipidemia, unspecified: Secondary | ICD-10-CM | POA: Diagnosis not present

## 2018-01-16 DIAGNOSIS — E118 Type 2 diabetes mellitus with unspecified complications: Secondary | ICD-10-CM | POA: Diagnosis not present

## 2018-01-18 ENCOUNTER — Ambulatory Visit: Payer: BLUE CROSS/BLUE SHIELD | Admitting: Family Medicine

## 2018-01-25 ENCOUNTER — Other Ambulatory Visit: Payer: Self-pay | Admitting: Family Medicine

## 2018-02-02 ENCOUNTER — Encounter (HOSPITAL_COMMUNITY): Payer: Self-pay

## 2018-02-02 ENCOUNTER — Encounter (HOSPITAL_COMMUNITY)
Admission: RE | Admit: 2018-02-02 | Discharge: 2018-02-02 | Disposition: A | Discharge status: Home / Self Care | Payer: PPO | Source: Ambulatory Visit | Attending: Internal Medicine | Admitting: Internal Medicine

## 2018-02-02 NOTE — Pre-Procedure Instructions (Signed)
Dr Rick Duff aware of procedure being done under general anesthesia and is okay doing it in Endo #3.

## 2018-02-08 ENCOUNTER — Encounter (HOSPITAL_COMMUNITY): Payer: Self-pay | Admitting: *Deleted

## 2018-02-08 ENCOUNTER — Ambulatory Visit (HOSPITAL_COMMUNITY)
Admission: RE | Admit: 2018-02-08 | Discharge: 2018-02-08 | Disposition: A | Discharge status: Home / Self Care | Payer: PPO | Source: Ambulatory Visit | Attending: Internal Medicine | Admitting: Internal Medicine

## 2018-02-08 ENCOUNTER — Ambulatory Visit (HOSPITAL_COMMUNITY): Payer: PPO | Admitting: Anesthesiology

## 2018-02-08 ENCOUNTER — Encounter (HOSPITAL_COMMUNITY)
Admission: RE | Disposition: A | Discharge status: Home / Self Care | Payer: Self-pay | Source: Ambulatory Visit | Attending: Internal Medicine

## 2018-02-08 DIAGNOSIS — E1140 Type 2 diabetes mellitus with diabetic neuropathy, unspecified: Secondary | ICD-10-CM | POA: Insufficient documentation

## 2018-02-08 DIAGNOSIS — M1990 Unspecified osteoarthritis, unspecified site: Secondary | ICD-10-CM | POA: Diagnosis not present

## 2018-02-08 DIAGNOSIS — Z7982 Long term (current) use of aspirin: Secondary | ICD-10-CM | POA: Diagnosis not present

## 2018-02-08 DIAGNOSIS — Z79899 Other long term (current) drug therapy: Secondary | ICD-10-CM | POA: Diagnosis not present

## 2018-02-08 DIAGNOSIS — I10 Essential (primary) hypertension: Secondary | ICD-10-CM | POA: Insufficient documentation

## 2018-02-08 DIAGNOSIS — D126 Benign neoplasm of colon, unspecified: Secondary | ICD-10-CM

## 2018-02-08 DIAGNOSIS — Z86010 Personal history of colonic polyps: Secondary | ICD-10-CM | POA: Insufficient documentation

## 2018-02-08 DIAGNOSIS — Z794 Long term (current) use of insulin: Secondary | ICD-10-CM | POA: Diagnosis not present

## 2018-02-08 DIAGNOSIS — Z6841 Body Mass Index (BMI) 40.0 and over, adult: Secondary | ICD-10-CM | POA: Insufficient documentation

## 2018-02-08 DIAGNOSIS — K635 Polyp of colon: Secondary | ICD-10-CM | POA: Insufficient documentation

## 2018-02-08 DIAGNOSIS — D869 Sarcoidosis, unspecified: Secondary | ICD-10-CM | POA: Diagnosis not present

## 2018-02-08 DIAGNOSIS — D125 Benign neoplasm of sigmoid colon: Secondary | ICD-10-CM | POA: Diagnosis not present

## 2018-02-08 DIAGNOSIS — Z881 Allergy status to other antibiotic agents status: Secondary | ICD-10-CM | POA: Diagnosis not present

## 2018-02-08 DIAGNOSIS — D122 Benign neoplasm of ascending colon: Secondary | ICD-10-CM | POA: Insufficient documentation

## 2018-02-08 DIAGNOSIS — E785 Hyperlipidemia, unspecified: Secondary | ICD-10-CM | POA: Insufficient documentation

## 2018-02-08 DIAGNOSIS — D120 Benign neoplasm of cecum: Secondary | ICD-10-CM | POA: Diagnosis not present

## 2018-02-08 HISTORY — PX: BIOPSY: SHX5522

## 2018-02-08 HISTORY — PX: COLONOSCOPY WITH PROPOFOL: SHX5780

## 2018-02-08 HISTORY — PX: POLYPECTOMY: SHX5525

## 2018-02-08 LAB — GLUCOSE, CAPILLARY: Glucose-Capillary: 258 mg/dL — ABNORMAL HIGH (ref 70–99)

## 2018-02-08 SURGERY — COLONOSCOPY WITH PROPOFOL
Anesthesia: General

## 2018-02-08 MED ORDER — LACTATED RINGERS IV SOLN
INTRAVENOUS | Status: DC
  Administered 2018-02-08: 1000 mL via INTRAVENOUS

## 2018-02-08 MED ORDER — HYDROCODONE-ACETAMINOPHEN 7.5-325 MG PO TABS: 1.0000 | ORAL_TABLET | Freq: Once | ORAL | Status: DC | PRN

## 2018-02-08 MED ORDER — PROPOFOL 10 MG/ML IV BOLUS
INTRAVENOUS | Status: AC
  Filled 2018-02-08: qty 40

## 2018-02-08 MED ORDER — ONDANSETRON HCL 4 MG/2ML IJ SOLN
INTRAMUSCULAR | Status: DC | PRN
  Administered 2018-02-08: 4 mg via INTRAVENOUS

## 2018-02-08 MED ORDER — CHLORHEXIDINE GLUCONATE CLOTH 2 % EX PADS: 6.0000 | MEDICATED_PAD | Freq: Once | CUTANEOUS | Status: DC

## 2018-02-08 MED ORDER — PROMETHAZINE HCL 25 MG/ML IJ SOLN: 6.2500 mg | INTRAMUSCULAR | Status: DC | PRN

## 2018-02-08 MED ORDER — MEPERIDINE HCL 100 MG/ML IJ SOLN: 6.2500 mg | INTRAMUSCULAR | Status: DC | PRN

## 2018-02-08 MED ORDER — GLYCOPYRROLATE 0.2 MG/ML IJ SOLN
INTRAMUSCULAR | Status: AC
  Filled 2018-02-08: qty 1

## 2018-02-08 MED ORDER — ROCURONIUM BROMIDE 100 MG/10ML IV SOLN
INTRAVENOUS | Status: DC | PRN
  Administered 2018-02-08: 5 mg via INTRAVENOUS
  Administered 2018-02-08: 25 mg via INTRAVENOUS
  Administered 2018-02-08: 10 mg via INTRAVENOUS

## 2018-02-08 MED ORDER — GLYCOPYRROLATE 0.2 MG/ML IJ SOLN
INTRAMUSCULAR | Status: DC | PRN
  Administered 2018-02-08: 0.2 mg via INTRAVENOUS

## 2018-02-08 MED ORDER — MIDAZOLAM HCL 2 MG/2ML IJ SOLN
INTRAMUSCULAR | Status: AC
  Filled 2018-02-08: qty 2

## 2018-02-08 MED ORDER — PHENYLEPHRINE HCL 10 MG/ML IJ SOLN
INTRAMUSCULAR | Status: DC | PRN
  Administered 2018-02-08: 80 ug via INTRAVENOUS

## 2018-02-08 MED ORDER — PROPOFOL 10 MG/ML IV BOLUS
INTRAVENOUS | Status: DC | PRN
  Administered 2018-02-08: 200 mg via INTRAVENOUS

## 2018-02-08 MED ORDER — EPHEDRINE SULFATE 50 MG/ML IJ SOLN
INTRAMUSCULAR | Status: DC | PRN
  Administered 2018-02-08: 10 mg via INTRAVENOUS

## 2018-02-08 MED ORDER — MIDAZOLAM HCL 5 MG/5ML IJ SOLN
INTRAMUSCULAR | Status: DC | PRN
  Administered 2018-02-08: 2 mg via INTRAVENOUS

## 2018-02-08 MED ORDER — SUCCINYLCHOLINE CHLORIDE 20 MG/ML IJ SOLN
INTRAMUSCULAR | Status: DC | PRN
  Administered 2018-02-08: 100 mg via INTRAVENOUS

## 2018-02-08 MED ORDER — SUGAMMADEX SODIUM 500 MG/5ML IV SOLN
INTRAVENOUS | Status: DC | PRN
  Administered 2018-02-08: 300 mg via INTRAVENOUS

## 2018-02-08 MED ORDER — HYDROMORPHONE HCL 1 MG/ML IJ SOLN: 0.2500 mg | INTRAMUSCULAR | Status: DC | PRN

## 2018-02-08 MED ORDER — LACTATED RINGERS IV SOLN: INTRAVENOUS | Status: DC

## 2018-02-08 NOTE — Progress Notes (Signed)
Please excuse Ann Roberson from work today 02/08/2018 thru 02/09/2018.  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  She may return to work on 02/10/2018 or her next scheduled day after 02/09/2018.

## 2018-02-08 NOTE — Anesthesia Preprocedure Evaluation (Signed)
Anesthesia Evaluation    History of Anesthesia Complications (+) PONV and history of anesthetic complications  Airway Mallampati: II       Dental   Pulmonary     + decreased breath sounds      Cardiovascular hypertension, On Medications + Valvular Problems/Murmurs  Rhythm:regular     Neuro/Psych    GI/Hepatic GERD  Medicated,  Endo/Other  diabetes, Type 2  Renal/GU      Musculoskeletal   Abdominal   Peds  Hematology  (+) Blood dyscrasia, anemia ,   Anesthesia Other Findings Morbid obesity w/ DM-II with FBS 254   Reproductive/Obstetrics                             Anesthesia Physical Anesthesia Plan  ASA: IV  Anesthesia Plan: General ETT   Post-op Pain Management:    Induction:   PONV Risk Score and Plan:   Airway Management Planned:   Additional Equipment:   Intra-op Plan:   Post-operative Plan:   Informed Consent:   Plan Discussed with: Anesthesiologist  Anesthesia Plan Comments:         Anesthesia Quick Evaluation

## 2018-02-08 NOTE — Op Note (Signed)
[facility] Patient Name: Ann Roberson Procedure Date: 02/08/2018 7:21 AM MRN: [id number] Date of Birth: 1950-04-07 Attending MD: Norvel Richards , MD CSN: [id number] Age: 67 Admit Type: Outpatient Procedure:                Colonoscopy Indications:              High risk colon cancer surveillance: Personal                            history of colonic polyps Providers:                Norvel Richards, MD, Janeece Riggers, RN, Nelma Rothman, Technician Referring MD:              Medicines:                General Anesthesia Complications:            No immediate complications. Estimated Blood Loss:     Estimated blood loss was minimal. Procedure:                Pre-Anesthesia Assessment:                           - Prior to the procedure, a History and Physical                            was performed, and patient medications and                            allergies were reviewed. The patient's tolerance of                            previous anesthesia was also reviewed. The risks                            and benefits of the procedure and the sedation                            options and risks were discussed with the patient.                            All questions were answered, and informed consent                            was obtained. ASA Grade Assessment: III - A patient                            with severe systemic disease. After reviewing the                            risks and benefits, the patient was deemed in  satisfactory condition to undergo the procedure.                           After obtaining informed consent, the colonoscope                            was passed under direct vision. Throughout the                            procedure, the patient's blood pressure, pulse, and                            oxygen saturations were monitored continuously. The                            CF-[id number]  ([id number]) scope was introduced through                            the and advanced to the the cecum, identified by                            appendiceal orifice and ileocecal valve. The entire                            colon was well visualized. The patient tolerated                            the procedure well. The quality of the bowel                            preparation was adequate. Scope In: 7:49:34 AM Scope Out: 8:26:48 AM Scope Withdrawal Time: 0 hours 33 minutes 46 seconds  Total Procedure Duration: 0 hours 37 minutes 14 seconds  Findings:      A 12 mm polyp was found in the ileocecal valve. The polyp was       carpet-like. Lesion was ball valving in and out of the ileocecal valve.       The polyp was removed with a hot snare. Resection and retrieval were       complete. Estimated blood loss: none.      Site of prior piecemeal polypectomy identified ascending segment with       tattoo. In this area there is a 1 x 1 cm area of flat scar versus       minimal residual polyp tissue. Somewhat suspicious on narrow band       imaging. It appeared to be a very thin almost translucent area. I       elected to cold biopsy this lesion. It was lifted away from the colonic       wall with eleview 4.5 cc injected submucosally. Finally, applied APC on       right colon settings at 20 J each -entirety of this lesion treated. This       was done without difficulty or apparent complication. There was a single       3 mm polyp mid sigmoid segment which was cold snare removed. Impression:               -  One 12 mm polyp at the ileocecal valve, removed                            with a hot snare. Resected and retrieved.                           -Possible residual polyp tissue -ascending segment.                            Biopsied and ablated with APC treatment sigmoid                            polyp cold snared. Moderate Sedation:      Moderate (conscious) sedation was personally  administered by an       anesthesia professional. The following parameters were monitored: oxygen       saturation, heart rate, blood pressure, respiratory rate, EKG, adequacy       of pulmonary ventilation, and response to care. Recommendation:           - Patient has a contact number available for                            emergencies. The signs and symptoms of potential                            delayed complications were discussed with the                            patient. Return to normal activities tomorrow.                            Written discharge instructions were provided to the                            patient.                           - Advance diet as tolerated.                           - No aspirin, ibuprofen, naproxen, or other                            non-steroidal anti-inflammatory drugs for 2 weeks.                           - Repeat colonoscopy date to be determined after                            pending pathology results are reviewed for                            surveillance based on pathology results.                           -  Return to GI clinic (date not yet determined).                            Polyps today were much more approachable given                            control of respiratory excursions with general                            anesthesia. Next colonoscopy needs to be done in                            the same manner. Procedure Code(s):        --- Professional ---                           [phone], Colonoscopy, flexible; with removal of                            tumor(s), polyp(s), or other lesion(s) by snare                            technique                           45380, 70, Colonoscopy, flexible; with biopsy,                            single or multiple Diagnosis Code(s):        --- Professional ---                           Z86.010, Personal history of colonic polyps                           D12.0, Benign neoplasm of  cecum                           D12.2, Benign neoplasm of ascending colon CPT copyright 2018 [employer]. All rights reserved. The codes documented in this report are preliminary and upon coder review may  be revised to meet current compliance requirements. Ann Estimable. Rourk, MD Norvel Richards, MD 02/08/2018 8:44:11 AM This report has been signed electronically. Number of Addenda: 0

## 2018-02-08 NOTE — Interval H&P Note (Signed)
History and Physical Interval Note:  02/08/2018 7:27 AM  Ann Roberson  has presented today for surgery, with the diagnosis of tubulovillous adenoma  The various methods of treatment have been discussed with the patient and family. After consideration of risks, benefits and other options for treatment, the patient has consented to  Procedure(s) with comments: COLONOSCOPY WITH GENERAL (N/A) - 9:15am  general anesthesia; intubation to control respirations. Allow double time for case per Dr. Gala Romney as a surgical intervention .  The patient's history has been reviewed, patient examined, no change in status, stable for surgery.  I have reviewed the patient's chart and labs.  Questions were answered to the patient's satisfaction.     [name]  No change.  Follow-up colonoscopy to remove residual polyp tissue.  Need for endotracheal intubation again discussed with family and anesthesia.  The risks, benefits, limitations, alternatives and imponderables have been reviewed with the patient. Questions have been answered. All parties are agreeable.

## 2018-02-08 NOTE — Anesthesia Postprocedure Evaluation (Signed)
Anesthesia Post Note  Patient: Ann Roberson  Procedure(s) Performed: COLONOSCOPY WITH GENERAL (N/A ) POLYPECTOMY BIOPSY  Patient location during evaluation: PACU Anesthesia Type: General Level of consciousness: awake and patient cooperative Pain management: pain level controlled Vital Signs Assessment: post-procedure vital signs reviewed and stable Respiratory status: spontaneous breathing Cardiovascular status: blood pressure returned to baseline Postop Assessment: no apparent nausea or vomiting Anesthetic complications: no     Last Vitals:  Vitals:   02/08/18 0844 02/08/18 0845  BP: 135/83 117/72  Pulse: (!) 113 (!) 111  Resp: 18 20  Temp: 36.6 C   SpO2: 100% 100%    Last Pain:  Vitals:   02/08/18 0900  TempSrc:   PainSc: 7                  IDACAVAGE,ROBERT J

## 2018-02-08 NOTE — Anesthesia Procedure Notes (Signed)
Procedure Name: Intubation Date/Time: 02/08/2018 7:41 AM Performed by: Charmaine Downs, CRNA Pre-anesthesia Checklist: Patient identified, Patient being monitored, Timeout performed, Emergency Drugs available and Suction available Patient Re-evaluated:Patient Re-evaluated prior to induction Oxygen Delivery Method: Circle System Utilized Preoxygenation: Pre-oxygenation with 100% oxygen Induction Type: IV induction and Rapid sequence Ventilation: Mask ventilation without difficulty Laryngoscope Size: Mac and 4 Grade View: Grade I Tube type: Oral Tube size: 7.0 mm Number of attempts: 1 Airway Equipment and Method: stylet Placement Confirmation: ETT inserted through vocal cords under direct vision,  positive ETCO2 and breath sounds checked- equal and bilateral Secured at: 22 cm Tube secured with: Tape Dental Injury: Teeth and Oropharynx as per pre-operative assessment

## 2018-02-08 NOTE — Transfer of Care (Signed)
Immediate Anesthesia Transfer of Care Note  Patient: Ann Roberson  Procedure(s) Performed: COLONOSCOPY WITH GENERAL (N/A ) POLYPECTOMY BIOPSY  Patient Location: PACU  Anesthesia Type:General  Level of Consciousness: awake and patient cooperative  Airway & Oxygen Therapy: Patient Spontanous Breathing and Patient connected to face mask oxygen  Post-op Assessment: Report given to RN, Post -op Vital signs reviewed and stable and Patient moving all extremities  Post vital signs: Reviewed and stable  Last Vitals:  Vitals Value Taken Time  BP    Temp    Pulse 63 02/08/2018  8:41 AM  Resp 29 02/08/2018  8:41 AM  SpO2 77 % 02/08/2018  8:41 AM  Vitals shown include unvalidated device data.  Last Pain:  Vitals:   02/08/18 0737  TempSrc:   PainSc: 0-No pain      Patients Stated Pain Goal: 7 (70/96/28 3662)  Complications: No apparent anesthesia complications

## 2018-02-08 NOTE — Discharge Instructions (Signed)
Colonoscopy Discharge Instructions  Read the instructions outlined below and refer to this sheet in the next few weeks. These discharge instructions provide you with general information on caring for yourself after you leave the hospital. Your doctor may also give you specific instructions. While your treatment has been planned according to the most current medical practices available, unavoidable complications occasionally occur. If you have any problems or questions after discharge, call Dr. Gala Romney at [phone]. ACTIVITY  You may resume your regular activity, but move at a slower pace for the next 24 hours.   Take frequent rest periods for the next 24 hours.   Walking will help get rid of the air and reduce the bloated feeling in your belly (abdomen).   No driving for 24 hours (because of the medicine (anesthesia) used during the test).    Do not sign any important legal documents or operate any machinery for 24 hours (because of the anesthesia used during the test).  NUTRITION  Drink plenty of fluids.   You may resume your normal diet as instructed by your doctor.   Begin with a light meal and progress to your normal diet. Heavy or fried foods are harder to digest and may make you feel sick to your stomach (nauseated).   Avoid alcoholic beverages for 24 hours or as instructed.  MEDICATIONS  You may resume your normal medications unless your doctor tells you otherwise.  WHAT YOU CAN EXPECT TODAY  Some feelings of bloating in the abdomen.   Passage of more gas than usual.   Spotting of blood in your stool or on the toilet paper.  IF YOU HAD POLYPS REMOVED DURING THE COLONOSCOPY:  No aspirin products for 7 days or as instructed.   No alcohol for 7 days or as instructed.   Eat a soft diet for the next 24 hours.  FINDING OUT THE RESULTS OF YOUR TEST Not all test results are available during your visit. If your test results are not back during the visit, make an appointment  with your caregiver to find out the results. Do not assume everything is normal if you have not heard from your caregiver or the medical facility. It is important for you to follow up on all of your test results.  SEEK IMMEDIATE MEDICAL ATTENTION IF:  You have more than a spotting of blood in your stool.   Your belly is swollen (abdominal distention).   You are nauseated or vomiting.   You have a temperature over 101.   You have abdominal pain or discomfort that is severe or gets worse throughout the day.    Colon polyp information provided  No aspirin or nonsteroidal agents such as Aleve or Advil x2 weeks  Further recommendations to follow pending review of pathology report      Colon Polyps Polyps are tissue growths inside the body. Polyps can grow in many places, including the large intestine (colon). A polyp may be a round bump or a mushroom-shaped growth. You could have one polyp or several. Most colon polyps are noncancerous (benign). However, some colon polyps can become cancerous over time. What are the causes? The exact cause of colon polyps is not known. What increases the risk? This condition is more likely to develop in people who:  Have a family history of colon cancer or colon polyps.  Are older than 72 or older than 45 if they are African American.  Have inflammatory bowel disease, such as ulcerative colitis or Crohn disease.  Are overweight.  Smoke cigarettes.  Do not get enough exercise.  Drink too much alcohol.  Eat a diet that is: ? High in fat and red meat. ? Low in fiber.  Had childhood cancer that was treated with abdominal radiation.  What are the signs or symptoms? Most polyps do not cause symptoms. If you have symptoms, they may include:  Blood coming from your rectum when having a bowel movement.  Blood in your stool.The stool may look dark red or black.  A change in bowel habits, such as constipation or diarrhea.  How is this  diagnosed? This condition is diagnosed with a colonoscopy. This is a procedure that uses a lighted, flexible scope to look at the inside of your colon. How is this treated? Treatment for this condition involves removing any polyps that are found. Those polyps will then be tested for cancer. If cancer is found, your health care provider will talk to you about options for colon cancer treatment. Follow these instructions at home: Diet  Eat plenty of fiber, such as fruits, vegetables, and whole grains.  Eat foods that are high in calcium and vitamin D, such as milk, cheese, yogurt, eggs, liver, fish, and broccoli.  Limit foods high in fat, red meats, and processed meats, such as hot dogs, sausage, bacon, and lunch meats.  Maintain a healthy weight, or lose weight if recommended by your health care provider. General instructions  Do not smoke cigarettes.  Do not drink alcohol excessively.  Keep all follow-up visits as told by your health care provider. This is important. This includes keeping regularly scheduled colonoscopies. Talk to your health care provider about when you need a colonoscopy.  Exercise every day or as told by your health care provider. Contact a health care provider if:  You have new or worsening bleeding during a bowel movement.  You have new or increased blood in your stool.  You have a change in bowel habits.  You unexpectedly lose weight. This information is not intended to replace advice given to you by your health care provider. Make sure you discuss any questions you have with your health care provider. Document Released: 12/09/2003 Document Revised: 08/20/2015 Document Reviewed: 02/02/2015 Elsevier Interactive Patient Education  2018 [place] Anesthesia, Adult, Care After These instructions provide you with information about caring for yourself after your procedure. Your health care provider may also give you more specific instructions.  Your treatment has been planned according to current medical practices, but problems sometimes occur. Call your health care provider if you have any problems or questions after your procedure. What can I expect after the procedure? After the procedure, it is common to have:  Vomiting.  A sore throat.  Mental slowness.  It is common to feel:  Nauseous.  Cold or shivery.  Sleepy.  Tired.  Sore or achy, even in parts of your body where you did not have surgery.  Follow these instructions at home: For at least 24 hours after the procedure:  Do not: ? Participate in activities where you could fall or become injured. ? Drive. ? Use heavy machinery. ? Drink alcohol. ? Take sleeping pills or medicines that cause drowsiness. ? Make important decisions or sign legal documents. ? Take care of children on your own.  Rest. Eating and drinking  If you vomit, drink water, juice, or soup when you can drink without vomiting.  Drink enough fluid to keep your urine clear or pale yellow.  Make sure you have little or no nausea before eating solid foods.  Follow the diet recommended by your health care provider. General instructions  Have a responsible adult stay with you until you are awake and alert.  Return to your normal activities as told by your health care provider. Ask your health care provider what activities are safe for you.  Take over-the-counter and prescription medicines only as told by your health care provider.  If you smoke, do not smoke without supervision.  Keep all follow-up visits as told by your health care provider. This is important. Contact a health care provider if:  You continue to have nausea or vomiting at home, and medicines are not helpful.  You cannot drink fluids or start eating again.  You cannot urinate after 8-12 hours.  You develop a skin rash.  You have fever.  You have increasing redness at the site of your procedure. Get help  right away if:  You have difficulty breathing.  You have chest pain.  You have unexpected bleeding.  You feel that you are having a life-threatening or urgent problem. This information is not intended to replace advice given to you by your health care provider. Make sure you discuss any questions you have with your health care provider. Document Released: 06/20/2000 Document Revised: 08/17/2015 Document Reviewed: 02/26/2015 Elsevier Interactive Patient Education  [employer].

## 2018-02-11 ENCOUNTER — Encounter: Payer: Self-pay | Admitting: Internal Medicine

## 2018-02-14 ENCOUNTER — Encounter (HOSPITAL_COMMUNITY): Payer: Self-pay | Admitting: Internal Medicine

## 2018-02-16 ENCOUNTER — Other Ambulatory Visit: Payer: Self-pay | Admitting: Family Medicine

## 2018-02-16 DIAGNOSIS — Z794 Long term (current) use of insulin: Principal | ICD-10-CM

## 2018-02-16 DIAGNOSIS — E1140 Type 2 diabetes mellitus with diabetic neuropathy, unspecified: Secondary | ICD-10-CM

## 2018-03-16 DIAGNOSIS — I119 Hypertensive heart disease without heart failure: Secondary | ICD-10-CM | POA: Diagnosis not present

## 2018-03-16 DIAGNOSIS — F324 Major depressive disorder, single episode, in partial remission: Secondary | ICD-10-CM | POA: Diagnosis not present

## 2018-03-16 DIAGNOSIS — Z6841 Body Mass Index (BMI) 40.0 and over, adult: Secondary | ICD-10-CM | POA: Diagnosis not present

## 2018-03-16 DIAGNOSIS — E785 Hyperlipidemia, unspecified: Secondary | ICD-10-CM | POA: Diagnosis not present

## 2018-03-16 DIAGNOSIS — E118 Type 2 diabetes mellitus with unspecified complications: Secondary | ICD-10-CM | POA: Diagnosis not present

## 2018-03-16 DIAGNOSIS — R3915 Urgency of urination: Secondary | ICD-10-CM | POA: Diagnosis not present

## 2018-03-19 ENCOUNTER — Other Ambulatory Visit: Payer: Self-pay | Admitting: Family Medicine

## 2018-03-19 DIAGNOSIS — I10 Essential (primary) hypertension: Secondary | ICD-10-CM

## 2018-04-04 DIAGNOSIS — E113293 Type 2 diabetes mellitus with mild nonproliferative diabetic retinopathy without macular edema, bilateral: Secondary | ICD-10-CM | POA: Diagnosis not present

## 2018-04-27 DIAGNOSIS — E118 Type 2 diabetes mellitus with unspecified complications: Secondary | ICD-10-CM | POA: Diagnosis not present

## 2018-04-27 DIAGNOSIS — Z7984 Long term (current) use of oral hypoglycemic drugs: Secondary | ICD-10-CM | POA: Diagnosis not present

## 2018-04-27 DIAGNOSIS — E785 Hyperlipidemia, unspecified: Secondary | ICD-10-CM | POA: Diagnosis not present

## 2018-04-27 DIAGNOSIS — F324 Major depressive disorder, single episode, in partial remission: Secondary | ICD-10-CM | POA: Diagnosis not present

## 2018-04-27 DIAGNOSIS — I10 Essential (primary) hypertension: Secondary | ICD-10-CM | POA: Diagnosis not present

## 2018-06-27 DIAGNOSIS — M25562 Pain in left knee: Secondary | ICD-10-CM | POA: Diagnosis not present

## 2018-06-27 DIAGNOSIS — F3160 Bipolar disorder, current episode mixed, unspecified: Secondary | ICD-10-CM | POA: Diagnosis not present

## 2018-06-27 DIAGNOSIS — J449 Chronic obstructive pulmonary disease, unspecified: Secondary | ICD-10-CM | POA: Diagnosis not present

## 2018-06-27 DIAGNOSIS — Z471 Aftercare following joint replacement surgery: Secondary | ICD-10-CM | POA: Diagnosis not present

## 2018-06-27 DIAGNOSIS — H25812 Combined forms of age-related cataract, left eye: Secondary | ICD-10-CM | POA: Diagnosis not present

## 2018-06-27 DIAGNOSIS — R0602 Shortness of breath: Secondary | ICD-10-CM | POA: Diagnosis not present

## 2018-06-27 DIAGNOSIS — F4320 Adjustment disorder, unspecified: Secondary | ICD-10-CM | POA: Diagnosis not present

## 2018-06-27 DIAGNOSIS — G35 Multiple sclerosis: Secondary | ICD-10-CM | POA: Diagnosis not present

## 2018-06-27 DIAGNOSIS — Z96611 Presence of right artificial shoulder joint: Secondary | ICD-10-CM | POA: Diagnosis not present

## 2018-06-29 DIAGNOSIS — F324 Major depressive disorder, single episode, in partial remission: Secondary | ICD-10-CM | POA: Diagnosis not present

## 2018-06-29 DIAGNOSIS — I10 Essential (primary) hypertension: Secondary | ICD-10-CM | POA: Diagnosis not present

## 2018-06-29 DIAGNOSIS — Z87442 Personal history of urinary calculi: Secondary | ICD-10-CM | POA: Diagnosis not present

## 2018-06-29 DIAGNOSIS — R109 Unspecified abdominal pain: Secondary | ICD-10-CM | POA: Diagnosis not present

## 2018-06-29 DIAGNOSIS — Z7984 Long term (current) use of oral hypoglycemic drugs: Secondary | ICD-10-CM | POA: Diagnosis not present

## 2018-06-29 DIAGNOSIS — E118 Type 2 diabetes mellitus with unspecified complications: Secondary | ICD-10-CM | POA: Diagnosis not present

## 2018-07-16 ENCOUNTER — Telehealth: Payer: Self-pay | Admitting: Cardiovascular Disease

## 2018-07-16 MED ORDER — ATORVASTATIN CALCIUM 40 MG PO TABS: 40.0000 mg | ORAL_TABLET | Freq: Every day | ORAL | 0 refills | Status: DC

## 2018-07-16 NOTE — Telephone Encounter (Signed)
Refill complete 

## 2018-07-16 NOTE — Telephone Encounter (Signed)
°*  STAT* If patient is at the pharmacy, call can be transferred to refill team.   1. Which medications need to be refilled? (please list name of each medication and dose if known) Atorvastatin 40mg  tab  2. Which pharmacy/location (including street and city if local pharmacy) is medication to be sent to?[facility] [place]  3. Do they need a 30 day or 90 day supply? 90 day supply

## 2018-09-02 ENCOUNTER — Other Ambulatory Visit: Payer: Self-pay | Admitting: Cardiovascular Disease

## 2018-10-05 ENCOUNTER — Other Ambulatory Visit: Payer: Self-pay | Admitting: Cardiovascular Disease

## 2018-10-19 ENCOUNTER — Other Ambulatory Visit: Payer: Self-pay

## 2018-10-19 ENCOUNTER — Other Ambulatory Visit: Payer: PPO

## 2018-10-19 DIAGNOSIS — Z20822 Contact with and (suspected) exposure to covid-19: Secondary | ICD-10-CM

## 2018-10-19 DIAGNOSIS — R6889 Other general symptoms and signs: Secondary | ICD-10-CM | POA: Diagnosis not present

## 2018-10-22 LAB — NOVEL CORONAVIRUS, NAA: SARS-CoV-2, NAA: DETECTED — AB

## 2018-10-23 ENCOUNTER — Encounter (HOSPITAL_COMMUNITY): Payer: Self-pay | Admitting: Emergency Medicine

## 2018-10-23 ENCOUNTER — Emergency Department (HOSPITAL_COMMUNITY): Payer: PPO

## 2018-10-23 ENCOUNTER — Inpatient Hospital Stay (HOSPITAL_COMMUNITY)
Admission: EM | Admit: 2018-10-23 | Discharge: 2018-10-25 | DRG: 178 | Disposition: A | Discharge status: Home / Self Care | Payer: PPO | Attending: Internal Medicine | Admitting: Internal Medicine

## 2018-10-23 ENCOUNTER — Other Ambulatory Visit: Payer: Self-pay

## 2018-10-23 DIAGNOSIS — E785 Hyperlipidemia, unspecified: Secondary | ICD-10-CM | POA: Diagnosis present

## 2018-10-23 DIAGNOSIS — E1169 Type 2 diabetes mellitus with other specified complication: Secondary | ICD-10-CM | POA: Diagnosis not present

## 2018-10-23 DIAGNOSIS — R0602 Shortness of breath: Secondary | ICD-10-CM | POA: Diagnosis not present

## 2018-10-23 DIAGNOSIS — D649 Anemia, unspecified: Secondary | ICD-10-CM | POA: Diagnosis present

## 2018-10-23 DIAGNOSIS — Z9049 Acquired absence of other specified parts of digestive tract: Secondary | ICD-10-CM | POA: Diagnosis not present

## 2018-10-23 DIAGNOSIS — I152 Hypertension secondary to endocrine disorders: Secondary | ICD-10-CM | POA: Diagnosis not present

## 2018-10-23 DIAGNOSIS — U071 COVID-19: Secondary | ICD-10-CM | POA: Diagnosis not present

## 2018-10-23 DIAGNOSIS — E1159 Type 2 diabetes mellitus with other circulatory complications: Secondary | ICD-10-CM | POA: Diagnosis present

## 2018-10-23 DIAGNOSIS — K219 Gastro-esophageal reflux disease without esophagitis: Secondary | ICD-10-CM | POA: Diagnosis present

## 2018-10-23 DIAGNOSIS — Z825 Family history of asthma and other chronic lower respiratory diseases: Secondary | ICD-10-CM

## 2018-10-23 DIAGNOSIS — E1142 Type 2 diabetes mellitus with diabetic polyneuropathy: Secondary | ICD-10-CM | POA: Diagnosis not present

## 2018-10-23 DIAGNOSIS — J069 Acute upper respiratory infection, unspecified: Secondary | ICD-10-CM

## 2018-10-23 DIAGNOSIS — R112 Nausea with vomiting, unspecified: Secondary | ICD-10-CM

## 2018-10-23 DIAGNOSIS — J449 Chronic obstructive pulmonary disease, unspecified: Secondary | ICD-10-CM | POA: Diagnosis present

## 2018-10-23 DIAGNOSIS — R0609 Other forms of dyspnea: Secondary | ICD-10-CM | POA: Diagnosis present

## 2018-10-23 DIAGNOSIS — Z888 Allergy status to other drugs, medicaments and biological substances status: Secondary | ICD-10-CM

## 2018-10-23 DIAGNOSIS — D869 Sarcoidosis, unspecified: Secondary | ICD-10-CM | POA: Diagnosis present

## 2018-10-23 DIAGNOSIS — N179 Acute kidney failure, unspecified: Secondary | ICD-10-CM | POA: Diagnosis not present

## 2018-10-23 DIAGNOSIS — I2510 Atherosclerotic heart disease of native coronary artery without angina pectoris: Secondary | ICD-10-CM | POA: Diagnosis present

## 2018-10-23 DIAGNOSIS — Z794 Long term (current) use of insulin: Secondary | ICD-10-CM | POA: Diagnosis not present

## 2018-10-23 DIAGNOSIS — R197 Diarrhea, unspecified: Secondary | ICD-10-CM

## 2018-10-23 DIAGNOSIS — Z79899 Other long term (current) drug therapy: Secondary | ICD-10-CM | POA: Diagnosis not present

## 2018-10-23 DIAGNOSIS — Z833 Family history of diabetes mellitus: Secondary | ICD-10-CM | POA: Diagnosis not present

## 2018-10-23 DIAGNOSIS — Z9012 Acquired absence of left breast and nipple: Secondary | ICD-10-CM

## 2018-10-23 DIAGNOSIS — R0600 Dyspnea, unspecified: Secondary | ICD-10-CM | POA: Diagnosis present

## 2018-10-23 DIAGNOSIS — E860 Dehydration: Secondary | ICD-10-CM | POA: Diagnosis not present

## 2018-10-23 LAB — COMPREHENSIVE METABOLIC PANEL
ALT: 21 U/L (ref 0–44)
AST: 23 U/L (ref 15–41)
Albumin: 3.5 g/dL (ref 3.5–5.0)
Alkaline Phosphatase: 81 U/L (ref 38–126)
Anion gap: 13 (ref 5–15)
BUN: 27 mg/dL — ABNORMAL HIGH (ref 8–23)
CO2: 20 mmol/L — ABNORMAL LOW (ref 22–32)
Calcium: 7.9 mg/dL — ABNORMAL LOW (ref 8.9–10.3)
Chloride: 107 mmol/L (ref 98–111)
Creatinine, Ser: 1.29 mg/dL — ABNORMAL HIGH (ref 0.44–1.00)
GFR calc Af Amer: 50 mL/min — ABNORMAL LOW (ref >60)
GFR calc non Af Amer: 43 mL/min — ABNORMAL LOW (ref >60)
Glucose, Bld: 222 mg/dL — ABNORMAL HIGH (ref 70–99)
Potassium: 4.5 mmol/L (ref 3.5–5.1)
Sodium: 140 mmol/L (ref 135–145)
Total Bilirubin: 0.7 mg/dL (ref 0.3–1.2)
Total Protein: 8 g/dL (ref 6.5–8.1)

## 2018-10-23 LAB — CBC WITH DIFFERENTIAL/PLATELET
Abs Immature Granulocytes: 0.05 10*3/uL (ref 0.00–0.07)
Basophils Absolute: 0 10*3/uL (ref 0.0–0.1)
Basophils Relative: 0 %
Eosinophils Absolute: 0.1 10*3/uL (ref 0.0–0.5)
Eosinophils Relative: 1 %
HCT: 33.8 % — ABNORMAL LOW (ref 36.0–46.0)
Hemoglobin: 10.2 g/dL — ABNORMAL LOW (ref 12.0–15.0)
Immature Granulocytes: 1 %
Lymphocytes Relative: 17 %
Lymphs Abs: 1.1 10*3/uL (ref 0.7–4.0)
MCH: 26.7 pg (ref 26.0–34.0)
MCHC: 30.2 g/dL (ref 30.0–36.0)
MCV: 88.5 fL (ref 80.0–100.0)
Monocytes Absolute: 0.6 10*3/uL (ref 0.1–1.0)
Monocytes Relative: 10 %
Neutro Abs: 4.7 10*3/uL (ref 1.7–7.7)
Neutrophils Relative %: 71 %
Platelets: 382 10*3/uL (ref 150–400)
RBC: 3.82 MIL/uL — ABNORMAL LOW (ref 3.87–5.11)
RDW: 12.8 % (ref 11.5–15.5)
WBC: 6.5 10*3/uL (ref 4.0–10.5)
nRBC: 0 % (ref 0.0–0.2)

## 2018-10-23 LAB — RETICULOCYTES
Immature Retic Fract: 25 % — ABNORMAL HIGH (ref 2.3–15.9)
RBC.: 3.66 MIL/uL — ABNORMAL LOW (ref 3.87–5.11)
Retic Count, Absolute: 37.7 10*3/uL (ref 19.0–186.0)
Retic Ct Pct: 1 % (ref 0.4–3.1)

## 2018-10-23 LAB — URINALYSIS, ROUTINE W REFLEX MICROSCOPIC
Bilirubin Urine: NEGATIVE
Glucose, UA: NEGATIVE mg/dL
Hgb urine dipstick: NEGATIVE
Ketones, ur: NEGATIVE mg/dL
Leukocytes,Ua: NEGATIVE
Nitrite: NEGATIVE
Protein, ur: 100 mg/dL — AB
Specific Gravity, Urine: 1.018 (ref 1.005–1.030)
pH: 5 (ref 5.0–8.0)

## 2018-10-23 LAB — VITAMIN B12: Vitamin B-12: 1911 pg/mL — ABNORMAL HIGH (ref 180–914)

## 2018-10-23 LAB — IRON AND TIBC
Iron: 25 ug/dL — ABNORMAL LOW (ref 28–170)
Saturation Ratios: 11 % (ref 10.4–31.8)
TIBC: 226 ug/dL — ABNORMAL LOW (ref 250–450)
UIBC: 201 ug/dL

## 2018-10-23 LAB — C-REACTIVE PROTEIN: CRP: 7.5 mg/dL — ABNORMAL HIGH (ref <1.0)

## 2018-10-23 LAB — LACTIC ACID, PLASMA: Lactic Acid, Venous: 1.2 mmol/L (ref 0.5–1.9)

## 2018-10-23 LAB — LACTATE DEHYDROGENASE: LDH: 239 U/L — ABNORMAL HIGH (ref 98–192)

## 2018-10-23 LAB — D-DIMER, QUANTITATIVE: D-Dimer, Quant: 1.06 ug/mL-FEU — ABNORMAL HIGH (ref 0.00–0.50)

## 2018-10-23 LAB — CBG MONITORING, ED: Glucose-Capillary: 206 mg/dL — ABNORMAL HIGH (ref 70–99)

## 2018-10-23 LAB — BRAIN NATRIURETIC PEPTIDE: B Natriuretic Peptide: 41 pg/mL (ref 0.0–100.0)

## 2018-10-23 LAB — FOLATE: Folate: 8.7 ng/mL (ref >5.9)

## 2018-10-23 LAB — LIPASE, BLOOD: Lipase: 32 U/L (ref 11–51)

## 2018-10-23 LAB — PROCALCITONIN: Procalcitonin: <0.1 ng/mL

## 2018-10-23 LAB — FERRITIN: Ferritin: 493 ng/mL — ABNORMAL HIGH (ref 11–307)

## 2018-10-23 LAB — FIBRINOGEN: Fibrinogen: 711 mg/dL — ABNORMAL HIGH (ref 210–475)

## 2018-10-23 LAB — TRIGLYCERIDES: Triglycerides: 88 mg/dL (ref <150)

## 2018-10-23 MED ORDER — SODIUM CHLORIDE 0.9 % IV BOLUS
500.0000 mL | Freq: Once | INTRAVENOUS | Status: AC
  Administered 2018-10-23: 500 mL via INTRAVENOUS

## 2018-10-23 MED ORDER — ACETAMINOPHEN ER 650 MG PO TBCR: 650.0000 mg | EXTENDED_RELEASE_TABLET | Freq: Two times a day (BID) | ORAL | Status: DC

## 2018-10-23 MED ORDER — ONDANSETRON HCL 4 MG/2ML IJ SOLN
4.0000 mg | Freq: Once | INTRAMUSCULAR | Status: AC
  Administered 2018-10-23: 4 mg via INTRAVENOUS
  Filled 2018-10-23: qty 2

## 2018-10-23 MED ORDER — AMLODIPINE BESYLATE 5 MG PO TABS
10.0000 mg | ORAL_TABLET | Freq: Every day | ORAL | Status: DC
  Administered 2018-10-23 – 2018-10-24 (×2): 10 mg via ORAL
  Filled 2018-10-23 (×2): qty 2

## 2018-10-23 MED ORDER — ACETAMINOPHEN 325 MG PO TABS: 650.0000 mg | ORAL_TABLET | Freq: Four times a day (QID) | ORAL | Status: DC | PRN

## 2018-10-23 MED ORDER — SODIUM CHLORIDE 0.9 % IV BOLUS: 500.0000 mL | Freq: Once | INTRAVENOUS | Status: DC

## 2018-10-23 MED ORDER — ACETAMINOPHEN ER 650 MG PO TBCR: 1300.0000 mg | EXTENDED_RELEASE_TABLET | Freq: Two times a day (BID) | ORAL | Status: DC

## 2018-10-23 MED ORDER — DEXAMETHASONE 6 MG PO TABS
6.0000 mg | ORAL_TABLET | Freq: Every day | ORAL | Status: DC
  Administered 2018-10-23: 6 mg via ORAL
  Filled 2018-10-23: qty 2

## 2018-10-23 MED ORDER — TRAMADOL HCL 50 MG PO TABS: 50.0000 mg | ORAL_TABLET | Freq: Four times a day (QID) | ORAL | Status: DC | PRN

## 2018-10-23 MED ORDER — METOPROLOL TARTRATE 25 MG PO TABS
25.0000 mg | ORAL_TABLET | Freq: Every day | ORAL | Status: DC
  Administered 2018-10-23 – 2018-10-24 (×2): 25 mg via ORAL
  Filled 2018-10-23 (×2): qty 1

## 2018-10-23 MED ORDER — SENNA 8.6 MG PO TABS
1.0000 | ORAL_TABLET | Freq: Two times a day (BID) | ORAL | Status: DC
  Administered 2018-10-24 – 2018-10-25 (×3): 8.6 mg via ORAL
  Filled 2018-10-23 (×3): qty 1

## 2018-10-23 MED ORDER — NITROGLYCERIN 0.4 MG SL SUBL: 0.4000 mg | SUBLINGUAL_TABLET | SUBLINGUAL | Status: DC | PRN

## 2018-10-23 MED ORDER — ENOXAPARIN SODIUM 60 MG/0.6ML ~~LOC~~ SOLN
60.0000 mg | SUBCUTANEOUS | Status: DC
  Administered 2018-10-23 – 2018-10-24 (×2): 60 mg via SUBCUTANEOUS
  Filled 2018-10-23 (×2): qty 0.6

## 2018-10-23 MED ORDER — SODIUM CHLORIDE 0.45 % IV SOLN
INTRAVENOUS | Status: DC
  Administered 2018-10-23: 20:00:00 via INTRAVENOUS

## 2018-10-23 MED ORDER — HYDROCHLOROTHIAZIDE 25 MG PO TABS
25.0000 mg | ORAL_TABLET | Freq: Every day | ORAL | Status: DC
  Administered 2018-10-24 – 2018-10-25 (×2): 25 mg via ORAL
  Filled 2018-10-23 (×2): qty 1

## 2018-10-23 MED ORDER — ATORVASTATIN CALCIUM 40 MG PO TABS
40.0000 mg | ORAL_TABLET | Freq: Every day | ORAL | Status: DC
  Administered 2018-10-24 – 2018-10-25 (×2): 40 mg via ORAL
  Filled 2018-10-23 (×2): qty 1

## 2018-10-23 MED ORDER — TRAZODONE HCL 50 MG PO TABS: 25.0000 mg | ORAL_TABLET | Freq: Every evening | ORAL | Status: DC | PRN

## 2018-10-23 MED ORDER — LOSARTAN POTASSIUM 25 MG PO TABS
100.0000 mg | ORAL_TABLET | Freq: Every day | ORAL | Status: DC
  Administered 2018-10-24: 100 mg via ORAL
  Filled 2018-10-23: qty 4

## 2018-10-23 MED ORDER — INSULIN ASPART 100 UNIT/ML ~~LOC~~ SOLN: 0.0000 [IU] | Freq: Three times a day (TID) | SUBCUTANEOUS | Status: DC

## 2018-10-23 MED ORDER — DULOXETINE HCL 60 MG PO CPEP
60.0000 mg | ORAL_CAPSULE | Freq: Every day | ORAL | Status: DC
  Administered 2018-10-24 – 2018-10-25 (×2): 60 mg via ORAL
  Filled 2018-10-23 (×3): qty 1

## 2018-10-23 MED ORDER — GABAPENTIN 300 MG PO CAPS
600.0000 mg | ORAL_CAPSULE | Freq: Three times a day (TID) | ORAL | Status: DC
  Administered 2018-10-23 – 2018-10-25 (×5): 600 mg via ORAL
  Filled 2018-10-23 (×5): qty 2

## 2018-10-23 MED ORDER — INSULIN GLARGINE 100 UNIT/ML ~~LOC~~ SOLN
65.0000 [IU] | Freq: Every day | SUBCUTANEOUS | Status: DC
  Filled 2018-10-23: qty 0.65
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
## 2018-10-23 NOTE — ED Notes (Signed)
Pt is refusing a rectal temperature. PA is aware.

## 2018-10-23 NOTE — ED Provider Notes (Signed)
[facility] EMERGENCY DEPARTMENT Provider Note   CSN: [id number] Arrival date & time: 10/23/18  1106    History   Chief Complaint Chief Complaint  Patient presents with   Shortness of Breath   Emesis    HPI  Ann Roberson is a 68 y.o. female with a PMH of Diabetes, HLD, HTN, Sarcoidosis, GERD, and Anemia presenting with shortness of breath, nausea, vomiting, and diarrhea onset 5 days ago. Patient reports shortness of breath is constant and worse with exertion. Patient reports 2 episodes of non bilious non bloody vomiting and 2 episodes of brown diarrhea in the last 24 hours. Patient reports fatigue and decreased appetite. Patient reports intermittent suprapubic abdominal pain. Patient denies fever, chills, congestion, cough, chest pain, or recent travel. Patient denies dysuria, frequency, hematuria, or flank pain. Patient reports she has had a c section, cholecystectomy, appendectomy, and tubal ligation in the past. Patient reports she has had a sick exposure with known COVID-19. Patient states was tested for COVID-19 by PCP and her results came back positive yesterday. Patient reports chronic ankle edema and states it is unchanged. Patient denies using estrogen, recent travel, recent surgery, leg pain, or hx of DVT/PE. Patient states her symptoms have not improved since onset and decided to come to the ER for further evaluation. Patient denies alcohol, tobacco, or drug use.     HPI  Past Medical History:  Diagnosis Date   Anemia    Arthritis    Diabetes mellitus    GERD (gastroesophageal reflux disease)    Heart murmur    Hyperlipidemia    Hypertension    Neuropathy    PONV (postoperative nausea and vomiting)    Sarcoidosis     Patient Active Problem List   Diagnosis Date Noted   Tubulovillous adenoma of colon 01/09/2018   Excessive ear wax 06/25/2017   Knee osteoarthritis 06/17/2017   Anemia in other chronic diseases classified elsewhere 06/17/2017    Type 2 diabetes mellitus with other specified complication ([place]) 93/23/5573   Upper airway cough syndrome 05/02/2017   Morbid obesity due to excess calories ([place]) 05/02/2017   Dyspnea on exertion 05/01/2017   Hypertension associated with diabetes ([place]) 04/06/2017   Hyperlipidemia associated with type 2 diabetes mellitus ([place]) 04/06/2017   Urticaria 11/17/2015   Angioedema 11/17/2015   Pain in joint, lower leg 12/10/2013    Past Surgical History:  Procedure Laterality Date   BIOPSY  02/08/2018   Procedure: BIOPSY;  Surgeon: Daneil Dolin, MD;  Location: [facility] END[facility];  Service: Endoscopy;;   CESAREAN SECTION     CHOLECYSTECTOMY     COLONOSCOPY WITH PROPOFOL N/A 07/06/2017   Dr. Gala Romney: Diverticulosis.five 8 to 16 mm polyps in the ascending colon and in the cecum, removed with hot snare.  Largest polyp could not be removed completely, status post inking.  Pathology revealed cecal tubular adenoma, a sending colon polyps with multiple fragments of tubular adenomas, tubulovillous adenoma, no high-grade dysplasia or malignancy.  Tubular adenomas removed from the rectum as    COLONOSCOPY WITH PROPOFOL N/A 02/08/2018   Procedure: COLONOSCOPY WITH GENERAL;  Surgeon: Daneil Dolin, MD;  Location: [facility];  Service: Endoscopy;  Laterality: N/A;  9:15am  general anesthesia; intubation to control respirations. Allow double time for case per Dr. Gala Romney   MASTECTOMY, PARTIAL Left    benign   POLYPECTOMY  07/06/2017   Procedure: POLYPECTOMY;  Surgeon: Daneil Dolin, MD;  Location: [facility];  Service: Endoscopy;;  cecal  polyp cs, ascending colon polyp hs   POLYPECTOMY  02/08/2018   Procedure: POLYPECTOMY;  Surgeon: Daneil Dolin, MD;  Location: [facility];  Service: Endoscopy;;   TUBAL LIGATION       OB History    Gravida  1   Para  1   Term  1   Preterm      AB      Living  1     SAB      TAB      Ectopic      Multiple      Live Births                Home Medications    Prior to Admission medications   Medication Sig Start Date End Date Taking? Authorizing Provider  acetaminophen (TYLENOL) 650 MG CR tablet Take 1,300 mg by mouth 2 (two) times daily.   Yes [provider]  amLODipine (NORVASC) 10 MG tablet Take 10 mg by mouth daily.   Yes [provider]  atorvastatin (LIPITOR) 40 MG tablet Take 1 tablet (40 mg total) by mouth daily. 07/16/18 10/23/18 Yes Herminio Commons, MD  B-D UF III MINI PEN NEEDLES 31G X 5 MM MISC USE WITH LANTUS PEN 09/26/17  Yes Hagler, Apolonio Schneiders, MD  DULoxetine (CYMBALTA) 60 MG capsule Take 1 capsule by mouth daily. 10/05/18  Yes [provider]  gabapentin (NEURONTIN) 300 MG capsule TAKE 2 CAPSULES BY MOUTH THREE TIMES DAILY 11/24/17  Yes Hagler, Apolonio Schneiders, MD  hydrochlorothiazide (HYDRODIURIL) 25 MG tablet Take 25 mg by mouth daily.  07/27/16  Yes [provider]  insulin glargine (LANTUS) 100 UNIT/ML injection Inject 0.6 mLs (60 Units total) into the skin at bedtime. Patient taking differently: Inject 65 Units into the skin at bedtime.  12/13/17  Yes Hagler, Apolonio Schneiders, MD  losartan (COZAAR) 100 MG tablet TAKE 1 TABLET BY MOUTH ONCE DAILY 12/11/17  Yes Hagler, Apolonio Schneiders, MD  metFORMIN (GLUCOPHAGE) 1000 MG tablet TAKE 1 TABLET BY MOUTH TWICE DAILY 12/11/17  Yes Hagler, Apolonio Schneiders, MD  metoprolol tartrate (LOPRESSOR) 25 MG tablet TAKE 1 TABLET BY MOUTH TWICE DAILY -- **NEED OFFICE VISIT** 10/05/18  Yes Herminio Commons, MD  nitroGLYCERIN (NITROSTAT) 0.4 MG SL tablet Place 1 tablet (0.4 mg total) under the tongue every 5 (five) minutes as needed. Patient taking differently: Place 0.4 mg under the tongue every 5 (five) minutes as needed for chest pain.  08/29/17  Yes Herminio Commons, MD  potassium chloride SA (K-DUR,KLOR-CON) 20 MEQ tablet Take 1 tablet (20 mEq total) by mouth daily. 08/30/17  Yes Caren Macadam, MD    Family History Family History  Problem Relation Age of Onset   Asthma  Brother    Cancer Mother    Cancer Father    Cancer Brother    Diabetes Brother    Allergic rhinitis Neg Hx    Angioedema Neg Hx    Atopy Neg Hx    Eczema Neg Hx    Immunodeficiency Neg Hx    Urticaria Neg Hx     Social History Social History   Tobacco Use   Smoking status: Never Smoker   Smokeless tobacco: Never Used  Substance Use Topics   Alcohol use: No   Drug use: No     Allergies   Erythromycin   Review of Systems Review of Systems  Constitutional: Positive for appetite change and fatigue. Negative for chills, diaphoresis, fever and unexpected weight change.  HENT: Negative for  congestion, rhinorrhea, sore throat and trouble swallowing.   Eyes: Negative for visual disturbance.  Respiratory: Positive for shortness of breath. Negative for cough, chest tightness, wheezing and stridor.   Cardiovascular: Positive for leg swelling (Pt reports chronic ankle edema.). Negative for chest pain and palpitations.  Gastrointestinal: Positive for abdominal pain, diarrhea, nausea and vomiting. Negative for abdominal distention, anal bleeding, blood in stool, constipation and rectal pain.  Endocrine: Negative for cold intolerance and heat intolerance.  Genitourinary: Negative for dysuria, flank pain, frequency and hematuria.  Musculoskeletal: Negative for myalgias.  Skin: Negative for pallor and rash.  Allergic/Immunologic: Negative for environmental allergies, food allergies and immunocompromised state.  Neurological: Negative for dizziness, syncope, speech difficulty, weakness, light-headedness and numbness.  Psychiatric/Behavioral: The patient is not nervous/anxious.      Physical Exam Updated Vital Signs BP (!) 133/58    Pulse (!) 102    Temp 98.5 F (36.9 C) (Oral)    Resp (!) 21    SpO2 94%   Physical Exam Vitals signs and nursing note reviewed.  Constitutional:      General: She is not in acute distress.    Appearance: She is well-developed. She is  not diaphoretic.  HENT:     Head: Normocephalic and atraumatic.     Mouth/Throat:     Mouth: Mucous membranes are moist.     Pharynx: No pharyngeal swelling or oropharyngeal exudate.  Eyes:     Extraocular Movements: Extraocular movements intact.     Pupils: Pupils are equal, round, and reactive to light.  Neck:     Musculoskeletal: Normal range of motion and neck supple.     Vascular: No JVD.  Cardiovascular:     Rate and Rhythm: Regular rhythm. Tachycardia present.     Pulses: Normal pulses.          Radial pulses are 2+ on the right side and 2+ on the left side.       Dorsalis pedis pulses are 2+ on the right side and 2+ on the left side.     Heart sounds: Normal heart sounds. No murmur. No friction rub. No gallop.   Pulmonary:     Effort: Pulmonary effort is normal. Tachypnea present. No accessory muscle usage or respiratory distress.     Breath sounds: Normal breath sounds. No decreased breath sounds, wheezing, rhonchi or rales.  Chest:     Chest wall: No tenderness.  Abdominal:     Palpations: Abdomen is soft.     Tenderness: There is no abdominal tenderness (No abdominal pain upon palpation. ). There is no right CVA tenderness, left CVA tenderness, guarding or rebound.  Musculoskeletal: Normal range of motion.     Right lower leg: She exhibits no tenderness. Edema present.     Left lower leg: She exhibits no tenderness. Edema (Bilateral ankle edema noted. No tenderness to palpation. ) present.  Skin:    Capillary Refill: Capillary refill takes less than 2 seconds.     Coloration: Skin is not pale.     Findings: No rash.  Neurological:     Mental Status: She is alert.    ED Treatments / Results  Labs (all labs ordered are listed, but only abnormal results are displayed) Labs Reviewed  URINALYSIS, ROUTINE W REFLEX MICROSCOPIC - Abnormal; Notable for the following components:      Result Value   Protein, ur 100 (*)    Bacteria, UA RARE (*)    All other components  within normal limits  CBC WITH DIFFERENTIAL/PLATELET - Abnormal; Notable for the following components:   RBC 3.82 (*)    Hemoglobin 10.2 (*)    HCT 33.8 (*)    All other components within normal limits  COMPREHENSIVE METABOLIC PANEL - Abnormal; Notable for the following components:   CO2 20 (*)    Glucose, Bld 222 (*)    BUN 27 (*)    Creatinine, Ser 1.29 (*)    Calcium 7.9 (*)    GFR calc non Af Amer 43 (*)    GFR calc Af Amer 50 (*)    All other components within normal limits  D-DIMER, QUANTITATIVE (NOT AT [facility]) - Abnormal; Notable for the following components:   D-Dimer, Quant 1.06 (*)    All other components within normal limits  LACTATE DEHYDROGENASE - Abnormal; Notable for the following components:   LDH 239 (*)    All other components within normal limits  FERRITIN - Abnormal; Notable for the following components:   Ferritin 493 (*)    All other components within normal limits  FIBRINOGEN - Abnormal; Notable for the following components:   Fibrinogen 711 (*)    All other components within normal limits  C-REACTIVE PROTEIN - Abnormal; Notable for the following components:   CRP 7.5 (*)    All other components within normal limits  CULTURE, BLOOD (ROUTINE X 2)  CULTURE, BLOOD (ROUTINE X 2)  URINE CULTURE  BRAIN NATRIURETIC PEPTIDE  LIPASE, BLOOD  LACTIC ACID, PLASMA  PROCALCITONIN  TRIGLYCERIDES    EKG EKG Interpretation  Date/Time:  Tuesday October 23 2018 11:29:30 EDT Ventricular Rate:  100 PR Interval:    QRS Duration: 89 QT Interval:  337 QTC Calculation: 435 R Axis:   26 Text Interpretation:  Sinus tachycardia Left ventricular hypertrophy Abnormal inferior Q waves Tall R wave in V2, consider RVH or PMI No acute changes No significant change since last tracing Confirmed by Varney Biles ([id number]) on 10/23/2018 12:05:05 PM   Radiology Dg Chest Port 1 View  Result Date: 10/23/2018 CLINICAL DATA:  Shortness of breath, nausea, vomiting and diarrhea since  yesterday in COVID-19 positive patient. EXAM: PORTABLE CHEST 1 VIEW COMPARISON:  PA and lateral chest 05/01/2017. Single-view of the chest 02/25/2016. FINDINGS: Lung volumes are much lower than on the comparison studies with crowding of the bronchovascular structures. No consolidative process, pneumothorax or effusion. Heart size is normal. Aortic atherosclerosis is noted. IMPRESSION: No acute abnormality in a low volume chest. Electronically Signed   By: Inge Rise M.D.   On: 10/23/2018 13:12    Procedures Procedures (including critical care time)  Medications Ordered in ED Medications  sodium chloride 0.9 % bolus 500 mL (0 mLs Intravenous Stopped 10/23/18 1548)  ondansetron (ZOFRAN) injection 4 mg (4 mg Intravenous Given 10/23/18 1557)     Initial Impression / Assessment and Plan / ED Course  I have reviewed the triage vital signs and the nursing notes.  Pertinent labs & imaging results that were available during my care of the patient were reviewed by me and considered in my medical decision making (see chart for details).  Clinical Course as of Oct 23 1555  Tue Oct 23, 2018  1209 WBCs are within normal limits.  WBC: 6.5 [AH]  1312 Lactic acid is 1.2.  Lactic acid, plasma [AH]  1319 No acute abnormality in a low volume chest.  DG Chest Port 1 View [AH]  1521 COVID-19 markers reveal elevated ferritin at 493, fibrinogen elevated at 711, LDH elevated at  239, elevated d dimer at 1.06, and elevated CRP at 7.5. Triglycerides and procalcitonin within normal limits.    [AH]    Clinical Course User Index [AH] Arville Lime, [place]      Patient presents with shortness of breath, nausea, vomiting, and diarrhea. Patient was positive for COVID-19 yesterday. CBC and lactic acid within normal limits. Patient is afebrile and oxygen is stable on room air. Lipase within normal limits. Creatinine elevated at 1.29. Provided gentle IVF due to elevated creatinine and episodes of vomiting and  diarrhea. Provided zofran for nausea and vomiting. CXR reveals lower lung volumes. EKG without acute changes. UA reveals protein and rare bacteria. Negative for leukocytes, nitrites, or WBCs. Patient has had tachycardia and tachypnea while in the ER. Shared decision making discussed with patient regarding inpatient admission vs outpatient management. Patient does not feel safe at home and requested to be admitted. Will consult hospitalist for admission. Hospitalist has agreed to admit patient.   Ann Roberson was evaluated in Emergency Department on 10/23/2018 for the symptoms described in the history of present illness. She was evaluated in the context of the global COVID-19 pandemic, which necessitated consideration that the patient might be at risk for infection with the SARS-CoV-2 virus that causes COVID-19. Institutional protocols and algorithms that pertain to the evaluation of patients at risk for COVID-19 are in a state of rapid change based on information released by regulatory bodies including the CDC and federal and state organizations. These policies and algorithms were followed during the patient's care in the ED.  Findings and plan of care discussed with supervising physician Dr. Kathrynn Humble who personally evaluated and examined this patient.   Final Clinical Impressions(s) / ED Diagnoses   Final diagnoses:  Shortness of breath  Nausea vomiting and diarrhea  COVID-19 virus detected  AKI (acute kidney injury) ([place])  Acute respiratory disease due to COVID-19 virus    ED Discharge Orders    None       Arville Lime, [place] 10/23/18 [place], Ankit, MD 10/24/18 1109

## 2018-10-23 NOTE — ED Triage Notes (Signed)
Pt is COVID positive.  Began having sob, n/v/d since yesterday. Sent over by her PCP

## 2018-10-24 ENCOUNTER — Other Ambulatory Visit: Payer: Self-pay

## 2018-10-24 ENCOUNTER — Encounter (HOSPITAL_COMMUNITY): Payer: Self-pay

## 2018-10-24 DIAGNOSIS — Z79899 Other long term (current) drug therapy: Secondary | ICD-10-CM | POA: Diagnosis not present

## 2018-10-24 DIAGNOSIS — R0602 Shortness of breath: Secondary | ICD-10-CM | POA: Diagnosis present

## 2018-10-24 DIAGNOSIS — Z888 Allergy status to other drugs, medicaments and biological substances status: Secondary | ICD-10-CM | POA: Diagnosis not present

## 2018-10-24 DIAGNOSIS — Z833 Family history of diabetes mellitus: Secondary | ICD-10-CM | POA: Diagnosis not present

## 2018-10-24 DIAGNOSIS — R197 Diarrhea, unspecified: Secondary | ICD-10-CM

## 2018-10-24 DIAGNOSIS — I152 Hypertension secondary to endocrine disorders: Secondary | ICD-10-CM | POA: Diagnosis present

## 2018-10-24 DIAGNOSIS — E785 Hyperlipidemia, unspecified: Secondary | ICD-10-CM | POA: Diagnosis present

## 2018-10-24 DIAGNOSIS — E860 Dehydration: Secondary | ICD-10-CM | POA: Diagnosis present

## 2018-10-24 DIAGNOSIS — D649 Anemia, unspecified: Secondary | ICD-10-CM | POA: Diagnosis present

## 2018-10-24 DIAGNOSIS — Z825 Family history of asthma and other chronic lower respiratory diseases: Secondary | ICD-10-CM | POA: Diagnosis not present

## 2018-10-24 DIAGNOSIS — E1169 Type 2 diabetes mellitus with other specified complication: Secondary | ICD-10-CM | POA: Diagnosis present

## 2018-10-24 DIAGNOSIS — E1142 Type 2 diabetes mellitus with diabetic polyneuropathy: Secondary | ICD-10-CM | POA: Diagnosis present

## 2018-10-24 DIAGNOSIS — Z9049 Acquired absence of other specified parts of digestive tract: Secondary | ICD-10-CM | POA: Diagnosis not present

## 2018-10-24 DIAGNOSIS — K219 Gastro-esophageal reflux disease without esophagitis: Secondary | ICD-10-CM | POA: Diagnosis present

## 2018-10-24 DIAGNOSIS — Z794 Long term (current) use of insulin: Secondary | ICD-10-CM | POA: Diagnosis not present

## 2018-10-24 DIAGNOSIS — U071 COVID-19: Secondary | ICD-10-CM | POA: Diagnosis present

## 2018-10-24 DIAGNOSIS — D869 Sarcoidosis, unspecified: Secondary | ICD-10-CM | POA: Diagnosis present

## 2018-10-24 DIAGNOSIS — R112 Nausea with vomiting, unspecified: Secondary | ICD-10-CM

## 2018-10-24 DIAGNOSIS — N179 Acute kidney failure, unspecified: Secondary | ICD-10-CM | POA: Diagnosis present

## 2018-10-24 DIAGNOSIS — J449 Chronic obstructive pulmonary disease, unspecified: Secondary | ICD-10-CM | POA: Diagnosis present

## 2018-10-24 DIAGNOSIS — I2510 Atherosclerotic heart disease of native coronary artery without angina pectoris: Secondary | ICD-10-CM | POA: Diagnosis present

## 2018-10-24 DIAGNOSIS — Z9012 Acquired absence of left breast and nipple: Secondary | ICD-10-CM | POA: Diagnosis not present

## 2018-10-24 LAB — COMPREHENSIVE METABOLIC PANEL
ALT: 21 U/L (ref 0–44)
AST: 20 U/L (ref 15–41)
Albumin: 3.1 g/dL — ABNORMAL LOW (ref 3.5–5.0)
Alkaline Phosphatase: 80 U/L (ref 38–126)
Anion gap: 11 (ref 5–15)
BUN: 26 mg/dL — ABNORMAL HIGH (ref 8–23)
CO2: 18 mmol/L — ABNORMAL LOW (ref 22–32)
Calcium: 7.3 mg/dL — ABNORMAL LOW (ref 8.9–10.3)
Chloride: 108 mmol/L (ref 98–111)
Creatinine, Ser: 1.25 mg/dL — ABNORMAL HIGH (ref 0.44–1.00)
GFR calc Af Amer: 52 mL/min — ABNORMAL LOW (ref >60)
GFR calc non Af Amer: 45 mL/min — ABNORMAL LOW (ref >60)
Glucose, Bld: 290 mg/dL — ABNORMAL HIGH (ref 70–99)
Potassium: 5.5 mmol/L — ABNORMAL HIGH (ref 3.5–5.1)
Sodium: 137 mmol/L (ref 135–145)
Total Bilirubin: 0.6 mg/dL (ref 0.3–1.2)
Total Protein: 7.6 g/dL (ref 6.5–8.1)

## 2018-10-24 LAB — URINE CULTURE

## 2018-10-24 LAB — CBC WITH DIFFERENTIAL/PLATELET
Abs Immature Granulocytes: 0.06 10*3/uL (ref 0.00–0.07)
Basophils Absolute: 0 10*3/uL (ref 0.0–0.1)
Basophils Relative: 0 %
Eosinophils Absolute: 0 10*3/uL (ref 0.0–0.5)
Eosinophils Relative: 0 %
HCT: 30.6 % — ABNORMAL LOW (ref 36.0–46.0)
Hemoglobin: 9.1 g/dL — ABNORMAL LOW (ref 12.0–15.0)
Immature Granulocytes: 1 %
Lymphocytes Relative: 10 %
Lymphs Abs: 0.6 10*3/uL — ABNORMAL LOW (ref 0.7–4.0)
MCH: 26.4 pg (ref 26.0–34.0)
MCHC: 29.7 g/dL — ABNORMAL LOW (ref 30.0–36.0)
MCV: 88.7 fL (ref 80.0–100.0)
Monocytes Absolute: 0.2 10*3/uL (ref 0.1–1.0)
Monocytes Relative: 3 %
Neutro Abs: 4.9 10*3/uL (ref 1.7–7.7)
Neutrophils Relative %: 86 %
Platelets: 406 10*3/uL — ABNORMAL HIGH (ref 150–400)
RBC: 3.45 MIL/uL — ABNORMAL LOW (ref 3.87–5.11)
RDW: 12.8 % (ref 11.5–15.5)
WBC: 5.7 10*3/uL (ref 4.0–10.5)
nRBC: 0 % (ref 0.0–0.2)

## 2018-10-24 LAB — MAGNESIUM: Magnesium: 1.1 mg/dL — ABNORMAL LOW (ref 1.7–2.4)

## 2018-10-24 LAB — HEMOGLOBIN A1C
Hgb A1c MFr Bld: 7 % — ABNORMAL HIGH (ref 4.8–5.6)
Hgb A1c MFr Bld: 7.1 % — ABNORMAL HIGH (ref 4.8–5.6)
Mean Plasma Glucose: 154.2 mg/dL
Mean Plasma Glucose: 157.07 mg/dL

## 2018-10-24 LAB — PHOSPHORUS: Phosphorus: 2.1 mg/dL — ABNORMAL LOW (ref 2.5–4.6)

## 2018-10-24 LAB — FERRITIN: Ferritin: 444 ng/mL — ABNORMAL HIGH (ref 11–307)

## 2018-10-24 LAB — GLUCOSE, CAPILLARY
Glucose-Capillary: 291 mg/dL — ABNORMAL HIGH (ref 70–99)
Glucose-Capillary: 264 mg/dL — ABNORMAL HIGH (ref 70–99)
Glucose-Capillary: 287 mg/dL — ABNORMAL HIGH (ref 70–99)
Glucose-Capillary: 246 mg/dL — ABNORMAL HIGH (ref 70–99)

## 2018-10-24 LAB — C-REACTIVE PROTEIN: CRP: 6.4 mg/dL — ABNORMAL HIGH (ref <1.0)

## 2018-10-24 LAB — D-DIMER, QUANTITATIVE: D-Dimer, Quant: 1.02 ug/mL-FEU — ABNORMAL HIGH (ref 0.00–0.50)

## 2018-10-24 MED ORDER — ENSURE ENLIVE PO LIQD: 237.0000 mL | Freq: Two times a day (BID) | ORAL | Status: DC

## 2018-10-24 MED ORDER — INSULIN ASPART 100 UNIT/ML ~~LOC~~ SOLN
0.0000 [IU] | Freq: Three times a day (TID) | SUBCUTANEOUS | Status: DC
  Administered 2018-10-24 (×3): 8 [IU] via SUBCUTANEOUS
  Administered 2018-10-25: 2 [IU] via SUBCUTANEOUS

## 2018-10-24 MED ORDER — INSULIN ASPART 100 UNIT/ML ~~LOC~~ SOLN
0.0000 [IU] | Freq: Every day | SUBCUTANEOUS | Status: DC
  Administered 2018-10-24: 2 [IU] via SUBCUTANEOUS

## 2018-10-24 MED ORDER — INSULIN GLARGINE 100 UNIT/ML ~~LOC~~ SOLN
30.0000 [IU] | Freq: Two times a day (BID) | SUBCUTANEOUS | Status: DC
  Administered 2018-10-24 – 2018-10-25 (×3): 30 [IU] via SUBCUTANEOUS
  Filled 2018-10-24 (×4): qty 0.3

## 2018-10-24 MED ORDER — INSULIN ASPART 100 UNIT/ML ~~LOC~~ SOLN
4.0000 [IU] | Freq: Three times a day (TID) | SUBCUTANEOUS | Status: DC
  Administered 2018-10-24 – 2018-10-25 (×4): 4 [IU] via SUBCUTANEOUS

## 2018-10-24 NOTE — Progress Notes (Signed)
[facility] PROGRESS NOTE    Progress Note  Ann Roberson  CHE:[id number] DOB: [birth year] DOA: 10/23/2018 PCP: Caren Macadam, MD     Brief Narrative:   Ann Roberson is an 68 y.o. female past medical history significant for essential hypertension diabetes peripheral neuropathy and diastolic dysfunction with a preserved EF, she relates she started with nausea vomiting and diarrhea at least 24 hours prior to admission with decreased oral intake.  She relates some mild abdominal pain with no focal diffuse tenderness.  She tested positive for SARS-CoV-2 on 10/22/2018  Assessment/Plan:   Nausea vomiting and diarrhea due to SARS-CoV-2 infection: Greater than 94% on room air, overnight she desaturated question obstructive sleep apnea. D/c dexamethasone, she is not hypoxic. Her inflammatory markers are elevated, her CRP 6.5 KVO IV fluids for mild dehydration. She relates she had a watery bowel movement this morning, she related slow down. Discontinue dexamethasone as she has no hypoxia.  Essential hypertension/Hypertension associated with diabetes ([place]) Blood pressure is fairly controlled. Tachycardia has resolved, continue metoprolol and home dose ARB.  Type 2 diabetes mellitus with other specified complication ([place]) With a last hemoglobin A1c of 9.3. Her Glucophage was stopped. A1c this morning is pending, she was started on long-acting insulin plus sliding scale.  Normocytic anemia: Ferritin is artificially elevated, her saturations 11 TIBC is 226 No signs of overt bleeding will need to follow-up with PCP as an outpatient.  DVT prophylaxis: lovenox Family Communication:none Disposition Plan/Barrier to D/C: home on 7.30.2020 Code Status:     Code Status Orders  (From admission, onward)         Start     Ordered   10/23/18 1823  Full code  Continuous     10/23/18 1827        Code Status History    This patient has a current code status but no historical  code status.   Advance Care Planning Activity        IV Access:    Peripheral IV   Procedures and diagnostic studies:   Dg Chest Port 1 View  Result Date: 10/23/2018 CLINICAL DATA:  Shortness of breath, nausea, vomiting and diarrhea since yesterday in COVID-19 positive patient. EXAM: PORTABLE CHEST 1 VIEW COMPARISON:  PA and lateral chest 05/01/2017. Single-view of the chest 02/25/2016. FINDINGS: Lung volumes are much lower than on the comparison studies with crowding of the bronchovascular structures. No consolidative process, pneumothorax or effusion. Heart size is normal. Aortic atherosclerosis is noted. IMPRESSION: No acute abnormality in a low volume chest. Electronically Signed   By: Inge Rise M.D.   On: 10/23/2018 13:12     Medical Consultants:    None.  Anti-Infectives:   none  Subjective:    Ann Roberson relates her diarrhea has improved.  Objective:    Vitals:   10/23/18 2030 10/23/18 2145 10/23/18 2321 10/24/18 0419  BP:  (!) 132/58 112/77   Pulse:  100 (!) 101 88  Resp:  18 19   Temp:   98.8 F (37.1 C)   TempSrc:   Oral   SpO2:  95% 95% 95%  Weight: 118.8 kg      SpO2: 95 %  No intake or output data in the 24 hours ending 10/24/18 0749 Filed Weights   10/23/18 2030  Weight: 118.8 kg    Exam: General exam: In no acute distress. Respiratory system: Good air movement and clear to auscultation. Cardiovascular system: S1 & S2 heard, RRR. No JVD. Gastrointestinal system:  Abdomen is nondistended, soft and nontender.  Central nervous system: Alert and oriented. No focal neurological deficits. Extremities: No pedal edema. Skin: No rashes, lesions or ulcers Psychiatry: Judgement and insight appear normal. Mood & affect appropriate.    Data Reviewed:    Labs: Basic Metabolic Panel: Recent Labs  Lab 10/23/18 1143  NA 140  K 4.5  CL 107  CO2 20*  GLUCOSE 222*  BUN 27*  CREATININE 1.29*  CALCIUM 7.9*   GFR CrCl cannot  be calculated (Unknown ideal weight.). Liver Function Tests: Recent Labs  Lab 10/23/18 1143  AST 23  ALT 21  ALKPHOS 81  BILITOT 0.7  PROT 8.0  ALBUMIN 3.5   Recent Labs  Lab 10/23/18 1141  LIPASE 32   No results for input(s): AMMONIA in the last 168 hours. Coagulation profile No results for input(s): INR, PROTIME in the last 168 hours. COVID-19 Labs  Recent Labs    10/23/18 1251  DDIMER 1.06*  FERRITIN 493*  LDH 239*  CRP 7.5*    Lab Results  Component Value Date   SARSCOV2NAA Detected (A) 10/19/2018    CBC: Recent Labs  Lab 10/23/18 1141  WBC 6.5  NEUTROABS 4.7  HGB 10.2*  HCT 33.8*  MCV 88.5  PLT 382   Cardiac Enzymes: No results for input(s): CKTOTAL, CKMB, CKMBINDEX, TROPONINI in the last 168 hours. BNP (last 3 results) No results for input(s): PROBNP in the last 8760 hours. CBG: Recent Labs  Lab 10/23/18 2141  GLUCAP 206*   D-Dimer: Recent Labs    10/23/18 1251  DDIMER 1.06*   Hgb A1c: No results for input(s): HGBA1C in the last 72 hours. Lipid Profile: Recent Labs    10/23/18 1251  TRIG 88   Thyroid function studies: No results for input(s): TSH, T4TOTAL, T3FREE, THYROIDAB in the last 72 hours.  Invalid input(s): FREET3 Anemia work up: Recent Labs    10/23/18 1237 10/23/18 1251  VITAMINB12  --  1,911*  FOLATE  --  8.7  FERRITIN  --  493*  TIBC  --  226*  IRON  --  25*  RETICCTPCT 1.0  --    Sepsis Labs: Recent Labs  Lab 10/23/18 1141 10/23/18 1251  PROCALCITON  --  <0.10  WBC 6.5  --   LATICACIDVEN  --  1.2   Microbiology Recent Results (from the past 240 hour(s))  Novel Coronavirus, NAA ([facility])     Status: Abnormal   Collection Time: 10/19/18  8:48 AM   Specimen: Nasal Swab  Result Value Ref Range Status   SARS-CoV-2, NAA Detected (A) Not Detected Final    Comment: Testing was performed using the cobas(R) SARS-CoV-2 test. This test was developed and its performance characteristics determined by [employer]. This test has not been FDA cleared or approved. This test has been authorized by FDA under an Emergency Use Authorization (EUA). This test is only authorized for the duration of time the declaration that circumstances exist justifying the authorization of the emergency use of in vitro diagnostic tests for detection of SARS-CoV-2 virus and/or diagnosis of COVID-19 infection under section 564(b)(1) of the Act, 21 U.S.C. 032ZYY-4(M)(2), unless the authorization is terminated or revoked sooner. When diagnostic testing is negative, the possibility of a false negative result should be considered in the context of a patient's recent exposures and the presence of clinical signs and symptoms consistent with COVID-19. An individual without symptoms of COVID-19 and who is not shedding SARS-CoV-2 virus would expect to have a  negati ve (not detected) result in this assay.   Blood Culture (routine x 2)     Status: None (Preliminary result)   Collection Time: 10/23/18 11:38 AM   Specimen: BLOOD RIGHT ARM  Result Value Ref Range Status   Specimen Description BLOOD SITE NOT SPECIFIED  Final   Special Requests   Final    BOTTLES DRAWN AEROBIC AND ANAEROBIC Blood Culture results may not be optimal due to an excessive volume of blood received in culture bottles Performed at [facility], [address] [place], [place] [address]    Culture PENDING  Incomplete   Report Status PENDING  Incomplete  Blood Culture (routine x 2)     Status: None (Preliminary result)   Collection Time: 10/23/18 12:50 PM   Specimen: BLOOD RIGHT ARM  Result Value Ref Range Status   Specimen Description BLOOD RIGHT ARM  Final   Special Requests   Final    BOTTLES DRAWN AEROBIC ONLY Blood Culture adequate volume Performed at [facility], [address] [place], [place] [address]    Culture PENDING  Incomplete   Report Status PENDING  Incomplete     Medications:   . amLODipine  10 mg Oral Daily  .  atorvastatin  40 mg Oral Daily  . dexamethasone  6 mg Oral Daily  . DULoxetine  60 mg Oral Daily  . enoxaparin (LOVENOX) injection  60 mg Subcutaneous Q24H  . feeding supplement (ENSURE ENLIVE)  237 mL Oral BID BM  . gabapentin  600 mg Oral TID  . hydrochlorothiazide  25 mg Oral Daily  . insulin aspart  0-20 Units Subcutaneous TID WC  . insulin glargine  65 Units Subcutaneous QHS  . losartan  100 mg Oral Daily  . metoprolol tartrate  25 mg Oral Daily  . senna  1 tablet Oral BID   Continuous Infusions: . sodium chloride 50 mL/hr at 10/23/18 2020     LOS: 0 days   Charlynne Cousins  [facility]  10/24/2018, 7:49 AM

## 2018-10-25 LAB — COMPREHENSIVE METABOLIC PANEL
ALT: 20 U/L (ref 0–44)
AST: 19 U/L (ref 15–41)
Albumin: 2.9 g/dL — ABNORMAL LOW (ref 3.5–5.0)
Alkaline Phosphatase: 65 U/L (ref 38–126)
Anion gap: 11 (ref 5–15)
BUN: 38 mg/dL — ABNORMAL HIGH (ref 8–23)
CO2: 19 mmol/L — ABNORMAL LOW (ref 22–32)
Calcium: 7.4 mg/dL — ABNORMAL LOW (ref 8.9–10.3)
Chloride: 109 mmol/L (ref 98–111)
Creatinine, Ser: 1.39 mg/dL — ABNORMAL HIGH (ref 0.44–1.00)
GFR calc Af Amer: 46 mL/min — ABNORMAL LOW (ref >60)
GFR calc non Af Amer: 40 mL/min — ABNORMAL LOW (ref >60)
Glucose, Bld: 186 mg/dL — ABNORMAL HIGH (ref 70–99)
Potassium: 5.2 mmol/L — ABNORMAL HIGH (ref 3.5–5.1)
Sodium: 139 mmol/L (ref 135–145)
Total Bilirubin: 0.4 mg/dL (ref 0.3–1.2)
Total Protein: 6.8 g/dL (ref 6.5–8.1)

## 2018-10-25 LAB — GLUCOSE, CAPILLARY
Glucose-Capillary: 132 mg/dL — ABNORMAL HIGH (ref 70–99)
Glucose-Capillary: 140 mg/dL — ABNORMAL HIGH (ref 70–99)

## 2018-10-25 LAB — HIV ANTIBODY (ROUTINE TESTING W REFLEX): HIV Screen 4th Generation wRfx: NONREACTIVE

## 2018-10-25 NOTE — Discharge Summary (Signed)
Physician Discharge Summary  Ann Roberson [facility]:[id number] DOB: April 20, 1950 DOA: 10/23/2018  PCP: Caren Macadam, MD  Admit date: 10/23/2018 Discharge date: 10/25/2018  Admitted From: Home Disposition:  Home  Recommendations for Outpatient Follow-up:  1. Follow up with PCP in 1-2 weeks, will send her for sleep study as she probably has obstructive sleep apnea. 2. She will also need a colonoscopy as an outpatient. 3.  Home Health:No Equipment/Devices:None  Discharge Condition:Stable CODE STATUS:Full Diet recommendation: Heart Healthy   Brief/Interim Summary: 68 y.o. female past medical history significant for essential hypertension diabetes peripheral neuropathy and diastolic dysfunction with a preserved EF, she relates she started with nausea vomiting and diarrhea at least 24 hours prior to admission with decreased oral intake.  She relates some mild abdominal pain with no focal diffuse tenderness.  She tested positive for SARS-CoV-2 on 10/22/2018  Discharge Diagnoses:  Active Problems:   Hypertension associated with diabetes ([place])   Hyperlipidemia associated with type 2 diabetes mellitus (HCC)   Dyspnea on exertion   Type 2 diabetes mellitus with other specified complication (HCC)   [id number] virus infection   DOE (dyspnea on exertion)   Nausea vomiting and diarrhea Nausea vomiting and diarrhea due to SARS-CoV-2 infection: Her saturations remain greater than 90% on room air, she desaturated overnight question obstructive sleep apnea. Inflammatory markers barely increased, her diarrhea resolved. She was treated conservatively and she was able to tolerate her diet.  Essential hypertension: No changes made to her medication.  Diabetes mellitus type 2 with other specified complications: With a last A1c of 9.3 her Glucophage was stopped on admission she was covered with long-acting insulin plus sliding scale, her blood glucose remain fairly controlled. She will resume her  Glucophage as an outpatient her A1c was 7.5 she was counseled about diet and lifestyle modifications.  Normocytic anemia: Ferritin was artificially elevated, there were no signs of overt bleeding, she will need to follow-up with her PCP and will probably need a colonoscopy as an outpatient.   Discharge Instructions  Discharge Instructions    Diet - low sodium heart healthy   Complete by: As directed    Increase activity slowly   Complete by: As directed      Allergies as of 10/25/2018      Reactions   Erythromycin Nausea And Vomiting      Medication List    TAKE these medications   acetaminophen 650 MG CR tablet Commonly known as: TYLENOL Take 1,300 mg by mouth 2 (two) times daily.   amLODipine 10 MG tablet Commonly known as: NORVASC Take 10 mg by mouth daily.   atorvastatin 40 MG tablet Commonly known as: LIPITOR Take 1 tablet (40 mg total) by mouth daily.   B-D UF III MINI PEN NEEDLES 31G X 5 MM Misc Generic drug: Insulin Pen Needle USE WITH LANTUS PEN   DULoxetine 60 MG capsule Commonly known as: CYMBALTA Take 1 capsule by mouth daily.   gabapentin 300 MG capsule Commonly known as: NEURONTIN TAKE 2 CAPSULES BY MOUTH THREE TIMES DAILY   hydrochlorothiazide 25 MG tablet Commonly known as: HYDRODIURIL Take 25 mg by mouth daily.   insulin glargine 100 UNIT/ML injection Commonly known as: LANTUS Inject 0.6 mLs (60 Units total) into the skin at bedtime. What changed: how much to take   losartan 100 MG tablet Commonly known as: COZAAR TAKE 1 TABLET BY MOUTH ONCE DAILY   metFORMIN 1000 MG tablet Commonly known as: GLUCOPHAGE TAKE 1 TABLET BY MOUTH TWICE DAILY  metoprolol tartrate 25 MG tablet Commonly known as: LOPRESSOR TAKE 1 TABLET BY MOUTH TWICE DAILY -- **NEED OFFICE VISIT**   nitroGLYCERIN 0.4 MG SL tablet Commonly known as: NITROSTAT Place 1 tablet (0.4 mg total) under the tongue every 5 (five) minutes as needed. What changed: reasons to take  this   potassium chloride SA 20 MEQ tablet Commonly known as: K-DUR Take 1 tablet (20 mEq total) by mouth daily.       Allergies  Allergen Reactions  . Erythromycin Nausea And Vomiting    Consultations:  None   Procedures/Studies: Dg Chest Port 1 View  Result Date: 10/23/2018 CLINICAL DATA:  Shortness of breath, nausea, vomiting and diarrhea since yesterday in COVID-19 positive patient. EXAM: PORTABLE CHEST 1 VIEW COMPARISON:  PA and lateral chest 05/01/2017. Single-view of the chest 02/25/2016. FINDINGS: Lung volumes are much lower than on the comparison studies with crowding of the bronchovascular structures. No consolidative process, pneumothorax or effusion. Heart size is normal. Aortic atherosclerosis is noted. IMPRESSION: No acute abnormality in a low volume chest. Electronically Signed   By: Inge Rise M.D.   On: 10/23/2018 13:12     Subjective: No complaints feels great.  Discharge Exam: Vitals:   10/25/18 0443 10/25/18 0753  BP:  121/72  Pulse: 81 80  Resp: 16 (!) 26  Temp:  97.7 F (36.5 C)  SpO2: 95% 96%   Vitals:   10/25/18 0441 10/25/18 0442 10/25/18 0443 10/25/18 0753  BP:  117/65  121/72  Pulse: 93 96 81 80  Resp: 19 (!) 21 16 (!) 26  Temp:  98 F (36.7 C)  97.7 F (36.5 C)  TempSrc:  Oral  Oral  SpO2: 100% 96% 95% 96%  Weight:        General: Pt is alert, awake, not in acute distress Cardiovascular: RRR, S1/S2 +, no rubs, no gallops Respiratory: CTA bilaterally, no wheezing, no rhonchi Abdominal: Soft, NT, ND, bowel sounds + Extremities: no edema, no cyanosis    The results of significant diagnostics from this hospitalization (including imaging, microbiology, ancillary and laboratory) are listed below for reference.     Microbiology: Recent Results (from the past 240 hour(s))  Novel Coronavirus, NAA ([facility])     Status: Abnormal   Collection Time: 10/19/18  8:48 AM   Specimen: Nasal Swab  Result Value Ref Range Status    SARS-CoV-2, NAA Detected (A) Not Detected Final    Comment: Testing was performed using the cobas(R) SARS-CoV-2 test. This test was developed and its performance characteristics determined by [employer]. This test has not been FDA cleared or approved. This test has been authorized by FDA under an Emergency Use Authorization (EUA). This test is only authorized for the duration of time the declaration that circumstances exist justifying the authorization of the emergency use of in vitro diagnostic tests for detection of SARS-CoV-2 virus and/or diagnosis of COVID-19 infection under section 564(b)(1) of the Act, 21 U.S.C. 478GNF-6(O)(1), unless the authorization is terminated or revoked sooner. When diagnostic testing is negative, the possibility of a false negative result should be considered in the context of a patient's recent exposures and the presence of clinical signs and symptoms consistent with COVID-19. An individual without symptoms of COVID-19 and who is not shedding SARS-CoV-2 virus would expect to have a negati ve (not detected) result in this assay.   Blood Culture (routine x 2)     Status: None (Preliminary result)   Collection Time: 10/23/18 11:38 AM   Specimen: BLOOD  Result Value Ref Range Status   Specimen Description BLOOD SITE NOT SPECIFIED  Final   Special Requests   Final    BOTTLES DRAWN AEROBIC AND ANAEROBIC Blood Culture results may not be optimal due to an excessive volume of blood received in culture bottles   Culture   Final    NO GROWTH 2 DAYS Performed at [facility], [address] [place], [place] [address]    Report Status PENDING  Incomplete  Urine culture     Status: Abnormal   Collection Time: 10/23/18 11:41 AM   Specimen: Urine, Random  Result Value Ref Range Status   Specimen Description   Final    URINE, RANDOM Performed at [facility], [address], [place], [place] [address]    Special Requests   Final    NONE Performed at  [facility], [address], [place], [place] [address]    Culture MULTIPLE SPECIES PRESENT, SUGGEST RECOLLECTION (A)  Final   Report Status 10/24/2018 FINAL  Final  Blood Culture (routine x 2)     Status: None (Preliminary result)   Collection Time: 10/23/18 12:50 PM   Specimen: BLOOD RIGHT ARM  Result Value Ref Range Status   Specimen Description BLOOD RIGHT ARM  Final   Special Requests   Final    BOTTLES DRAWN AEROBIC ONLY Blood Culture adequate volume   Culture   Final    NO GROWTH 2 DAYS Performed at [facility], [address] [place], [place] [address]    Report Status PENDING  Incomplete     Labs: BNP (last 3 results) Recent Labs    10/23/18 1141  BNP 28.0   Basic Metabolic Panel: Recent Labs  Lab 10/23/18 1143 10/24/18 0550 10/25/18 0230  NA 140 137 139  K 4.5 5.5* 5.2*  CL 107 108 109  CO2 20* 18* 19*  GLUCOSE 222* 290* 186*  BUN 27* 26* 38*  CREATININE 1.29* 1.25* 1.39*  CALCIUM 7.9* 7.3* 7.4*  MG  --  1.1*  --   PHOS  --  2.1*  --    Liver Function Tests: Recent Labs  Lab 10/23/18 1143 10/24/18 0550 10/25/18 0230  AST 23 20 19   ALT 21 21 20   ALKPHOS 81 80 65  BILITOT 0.7 0.6 0.4  PROT 8.0 7.6 6.8  ALBUMIN 3.5 3.1* 2.9*   Recent Labs  Lab 10/23/18 1141  LIPASE 32   No results for input(s): AMMONIA in the last 168 hours. CBC: Recent Labs  Lab 10/23/18 1141 10/24/18 0550  WBC 6.5 5.7  NEUTROABS 4.7 4.9  HGB 10.2* 9.1*  HCT 33.8* 30.6*  MCV 88.5 88.7  PLT 382 406*   Cardiac Enzymes: No results for input(s): CKTOTAL, CKMB, CKMBINDEX, TROPONINI in the last 168 hours. BNP: Invalid input(s): POCBNP CBG: Recent Labs  Lab 10/23/18 2141 10/24/18 0751 10/24/18 1146 10/24/18 1652 10/24/18 2142  GLUCAP 206* 291* 264* 287* 246*   D-Dimer Recent Labs    10/23/18 1251 10/24/18 0550  DDIMER 1.06* 1.02*   Hgb A1c Recent Labs    10/23/18 1141 10/24/18 0550  HGBA1C 7.0* 7.1*   Lipid Profile Recent Labs    10/23/18 1251   TRIG 88   Thyroid function studies No results for input(s): TSH, T4TOTAL, T3FREE, THYROIDAB in the last 72 hours.  Invalid input(s): FREET3 Anemia work up Recent Labs    10/23/18 1237 10/23/18 1251 10/24/18 0550  VITAMINB12  --  1,911*  --   FOLATE  --  8.7  --   FERRITIN  --  493* 444*  TIBC  --  226*  --   IRON  --  25*  --   RETICCTPCT 1.0  --   --    Urinalysis    Component Value Date/Time   COLORURINE YELLOW 10/23/2018 1141   APPEARANCEUR CLEAR 10/23/2018 1141   LABSPEC 1.018 10/23/2018 1141   PHURINE 5.0 10/23/2018 1141   GLUCOSEU NEGATIVE 10/23/2018 1141   HGBUR NEGATIVE 10/23/2018 1141   BILIRUBINUR NEGATIVE 10/23/2018 1141   KETONESUR NEGATIVE 10/23/2018 1141   PROTEINUR 100 (A) 10/23/2018 1141   UROBILINOGEN 0.2 01/29/2011 1528   NITRITE NEGATIVE 10/23/2018 1141   LEUKOCYTESUR NEGATIVE 10/23/2018 1141   Sepsis Labs Invalid input(s): PROCALCITONIN,  WBC,  LACTICIDVEN Microbiology Recent Results (from the past 240 hour(s))  Novel Coronavirus, NAA ([facility])     Status: Abnormal   Collection Time: 10/19/18  8:48 AM   Specimen: Nasal Swab  Result Value Ref Range Status   SARS-CoV-2, NAA Detected (A) Not Detected Final    Comment: Testing was performed using the cobas(R) SARS-CoV-2 test. This test was developed and its performance characteristics determined by [employer]. This test has not been FDA cleared or approved. This test has been authorized by FDA under an Emergency Use Authorization (EUA). This test is only authorized for the duration of time the declaration that circumstances exist justifying the authorization of the emergency use of in vitro diagnostic tests for detection of SARS-CoV-2 virus and/or diagnosis of COVID-19 infection under section 564(b)(1) of the Act, 21 U.S.C. 631SHF-0(Y)(6), unless the authorization is terminated or revoked sooner. When diagnostic testing is negative, the possibility of a false negative result should be  considered in the context of a patient's recent exposures and the presence of clinical signs and symptoms consistent with COVID-19. An individual without symptoms of COVID-19 and who is not shedding SARS-CoV-2 virus would expect to have a negati ve (not detected) result in this assay.   Blood Culture (routine x 2)     Status: None (Preliminary result)   Collection Time: 10/23/18 11:38 AM   Specimen: BLOOD  Result Value Ref Range Status   Specimen Description BLOOD SITE NOT SPECIFIED  Final   Special Requests   Final    BOTTLES DRAWN AEROBIC AND ANAEROBIC Blood Culture results may not be optimal due to an excessive volume of blood received in culture bottles   Culture   Final    NO GROWTH 2 DAYS Performed at [facility], [address], [place], [place] [address]    Report Status PENDING  Incomplete  Urine culture     Status: Abnormal   Collection Time: 10/23/18 11:41 AM   Specimen: Urine, Random  Result Value Ref Range Status   Specimen Description   Final    URINE, RANDOM Performed at [facility], [address], [place], [place] [address]    Special Requests   Final    NONE Performed at [facility], [address], [place], [place] [address]    Culture MULTIPLE SPECIES PRESENT, SUGGEST RECOLLECTION (A)  Final   Report Status 10/24/2018 FINAL  Final  Blood Culture (routine x 2)     Status: None (Preliminary result)   Collection Time: 10/23/18 12:50 PM   Specimen: BLOOD RIGHT ARM  Result Value Ref Range Status   Specimen Description BLOOD RIGHT ARM  Final   Special Requests   Final    BOTTLES DRAWN AEROBIC ONLY Blood Culture adequate volume  Culture   Final    NO GROWTH 2 DAYS Performed at [facility], [address] [place], [place] [address]    Report Status PENDING  Incomplete     Time coordinating discharge: Over 40 minutes  SIGNED:   Charlynne Cousins, MD  [facility] 10/25/2018, 11:13 AM Pager   If 7PM-7AM, please contact  night-coverage [email or web address] Password [email or web address]

## 2018-10-28 LAB — CULTURE, BLOOD (ROUTINE X 2)
Culture: NO GROWTH
Culture: NO GROWTH
Special Requests: ADEQUATE

## 2018-11-01 ENCOUNTER — Other Ambulatory Visit: Payer: Self-pay

## 2018-11-01 DIAGNOSIS — Z20822 Contact with and (suspected) exposure to covid-19: Secondary | ICD-10-CM

## 2018-11-02 LAB — NOVEL CORONAVIRUS, NAA: SARS-CoV-2, NAA: NOT DETECTED

## 2018-11-08 DIAGNOSIS — I10 Essential (primary) hypertension: Secondary | ICD-10-CM | POA: Diagnosis not present

## 2018-11-08 DIAGNOSIS — E785 Hyperlipidemia, unspecified: Secondary | ICD-10-CM | POA: Diagnosis not present

## 2018-11-08 DIAGNOSIS — U071 COVID-19: Secondary | ICD-10-CM | POA: Diagnosis not present

## 2018-11-08 DIAGNOSIS — Z9289 Personal history of other medical treatment: Secondary | ICD-10-CM | POA: Diagnosis not present

## 2018-11-08 DIAGNOSIS — E118 Type 2 diabetes mellitus with unspecified complications: Secondary | ICD-10-CM | POA: Diagnosis not present

## 2018-11-27 DIAGNOSIS — I10 Essential (primary) hypertension: Secondary | ICD-10-CM | POA: Diagnosis not present

## 2018-11-27 DIAGNOSIS — Z794 Long term (current) use of insulin: Secondary | ICD-10-CM | POA: Diagnosis not present

## 2018-11-27 DIAGNOSIS — E118 Type 2 diabetes mellitus with unspecified complications: Secondary | ICD-10-CM | POA: Diagnosis not present

## 2018-11-27 DIAGNOSIS — E785 Hyperlipidemia, unspecified: Secondary | ICD-10-CM | POA: Diagnosis not present

## 2018-11-27 DIAGNOSIS — D500 Iron deficiency anemia secondary to blood loss (chronic): Secondary | ICD-10-CM | POA: Diagnosis not present

## 2018-11-30 ENCOUNTER — Other Ambulatory Visit (HOSPITAL_COMMUNITY): Payer: Self-pay | Admitting: Family Medicine

## 2018-11-30 DIAGNOSIS — Z1231 Encounter for screening mammogram for malignant neoplasm of breast: Secondary | ICD-10-CM

## 2018-12-12 ENCOUNTER — Encounter: Payer: Self-pay | Admitting: Cardiovascular Disease

## 2018-12-12 ENCOUNTER — Ambulatory Visit (INDEPENDENT_AMBULATORY_CARE_PROVIDER_SITE_OTHER): Payer: PPO | Admitting: Cardiovascular Disease

## 2018-12-12 ENCOUNTER — Other Ambulatory Visit: Payer: Self-pay

## 2018-12-12 VITALS — BP 115/69 | HR 96 | Temp 98.0°F | Ht 64.0 in | Wt 251.0 lb

## 2018-12-12 DIAGNOSIS — I350 Nonrheumatic aortic (valve) stenosis: Secondary | ICD-10-CM

## 2018-12-12 DIAGNOSIS — R9439 Abnormal result of other cardiovascular function study: Secondary | ICD-10-CM

## 2018-12-12 DIAGNOSIS — R079 Chest pain, unspecified: Secondary | ICD-10-CM

## 2018-12-12 DIAGNOSIS — Z794 Long term (current) use of insulin: Secondary | ICD-10-CM

## 2018-12-12 DIAGNOSIS — I25118 Atherosclerotic heart disease of native coronary artery with other forms of angina pectoris: Secondary | ICD-10-CM

## 2018-12-12 DIAGNOSIS — E119 Type 2 diabetes mellitus without complications: Secondary | ICD-10-CM | POA: Diagnosis not present

## 2018-12-12 DIAGNOSIS — I10 Essential (primary) hypertension: Secondary | ICD-10-CM | POA: Diagnosis not present

## 2018-12-12 DIAGNOSIS — E785 Hyperlipidemia, unspecified: Secondary | ICD-10-CM

## 2018-12-12 DIAGNOSIS — IMO0001 Reserved for inherently not codable concepts without codable children: Secondary | ICD-10-CM

## 2018-12-12 MED ORDER — ATORVASTATIN CALCIUM 40 MG PO TABS: 40.0000 mg | ORAL_TABLET | Freq: Every day | ORAL | 3 refills | Status: DC

## 2018-12-12 MED ORDER — METOPROLOL TARTRATE 25 MG PO TABS: ORAL_TABLET | ORAL | 3 refills | Status: DC

## 2018-12-12 NOTE — Patient Instructions (Signed)
Medication Instructions:  Your physician recommends that you continue on your current medications as directed. Please refer to the Current Medication list given to you today.  If you need a refill on your cardiac medications before your next appointment, please call your pharmacy.   Lab work: NONE   If you have labs (blood work) drawn today and your tests are completely normal, you will receive your results only by: . MyChart Message (if you have MyChart) OR . A paper copy in the mail If you have any lab test that is abnormal or we need to change your treatment, we will call you to review the results.  Testing/Procedures: NONE   Follow-Up: At [facility], you and your health needs are our priority.  As part of our continuing mission to provide you with exceptional heart care, we have created designated Provider Care Teams.  These Care Teams include your primary Cardiologist (physician) and Advanced Practice Providers (APPs -  Physician Assistants and Nurse Practitioners) who all work together to provide you with the care you need, when you need it. You will need a follow up appointment in 6 months.  Please call our office 2 months in advance to schedule this appointment.  You may see Suresh Koneswaran, MD or one of the following Advanced Practice Providers on your designated Care Team:   Brittany Strader, PA-C ([place] Office) . Michele Lenze, PA-C ([place] Office)  Any Other Special Instructions Will Be Listed Below (If Applicable). Thank you for choosing [facility]!     

## 2018-12-12 NOTE — Progress Notes (Signed)
SUBJECTIVE: The patient presents for routine follow-up.  She tested positive for SARS-CoV-2 on 10/22/2018.  She was briefly hospitalized with nausea vomiting and diarrhea.  She presents for follow-up of presumed coronary artery disease as well as hypertension and aortic stenosis.  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  She contracted it after being in a home where the people had gone to [place] for the 4th of July.  She ran out of Lopressor and atorvastatin about 3 weeks ago.  She has had mild left-sided chest pains but nothing requiring nitroglycerin.  Chronic exertional dyspnea is stable.  She walks with a cane as she has bilateral knee pain/arthritis.     Review of Systems: As per "subjective", otherwise negative.  Allergies  Allergen Reactions   Erythromycin Nausea And Vomiting    Current Outpatient Medications  Medication Sig Dispense Refill   acetaminophen (TYLENOL) 650 MG CR tablet Take 1,300 mg by mouth 2 (two) times daily.     amLODipine (NORVASC) 10 MG tablet Take 10 mg by mouth daily.     atorvastatin (LIPITOR) 40 MG tablet Take 1 tablet (40 mg total) by mouth daily. 30 tablet 0   B-D UF III MINI PEN NEEDLES 31G X 5 MM MISC USE WITH LANTUS PEN 100 each 0   DULoxetine (CYMBALTA) 60 MG capsule Take 1 capsule by mouth daily.     gabapentin (NEURONTIN) 300 MG capsule TAKE 2 CAPSULES BY MOUTH THREE TIMES DAILY 180 capsule 1   hydrochlorothiazide (HYDRODIURIL) 25 MG tablet Take 25 mg by mouth daily.   1   insulin glargine (LANTUS) 100 UNIT/ML injection Inject 0.6 mLs (60 Units total) into the skin at bedtime. (Patient taking differently: Inject 65 Units into the skin at bedtime. ) 10 mL 1   losartan (COZAAR) 100 MG tablet TAKE 1 TABLET BY MOUTH ONCE DAILY 90 tablet 0   metFORMIN (GLUCOPHAGE) 1000 MG tablet TAKE 1 TABLET BY MOUTH TWICE DAILY 60 tablet 0   metoprolol tartrate (LOPRESSOR) 25 MG tablet TAKE 1 TABLET BY  MOUTH TWICE DAILY -- **NEED OFFICE VISIT** 60 tablet 0   nitroGLYCERIN (NITROSTAT) 0.4 MG SL tablet Place 1 tablet (0.4 mg total) under the tongue every 5 (five) minutes as needed. (Patient taking differently: Place 0.4 mg under the tongue every 5 (five) minutes as needed for chest pain. ) 25 tablet 3   potassium chloride SA (K-DUR,KLOR-CON) 20 MEQ tablet Take 1 tablet (20 mEq total) by mouth daily. 90 tablet 1   No current facility-administered medications for this visit.     Past Medical History:  Diagnosis Date   Anemia    Arthritis    Diabetes mellitus    GERD (gastroesophageal reflux disease)    Heart murmur    Hyperlipidemia    Hypertension    Neuropathy    PONV (postoperative nausea and vomiting)    Sarcoidosis     Past Surgical History:  Procedure Laterality Date   BIOPSY  02/08/2018   Procedure: BIOPSY;  Surgeon: Daneil Dolin, MD;  [facility];  Service: Endoscopy;;   CESAREAN SECTION     CHOLECYSTECTOMY     COLONOSCOPY WITH PROPOFOL N/A 07/06/2017   Dr. Gala Romney: Diverticulosis.five 8 to 16 mm polyps in the ascending colon and in the cecum, removed with hot snare.  Largest polyp could not be removed completely, status post inking.  Pathology revealed cecal tubular adenoma, a sending colon  polyps with multiple fragments of tubular adenomas, tubulovillous adenoma, no high-grade dysplasia or malignancy.  Tubular adenomas removed from the rectum as    COLONOSCOPY WITH PROPOFOL N/A 02/08/2018   Procedure: COLONOSCOPY WITH GENERAL;  Surgeon: Daneil Dolin, MD;  Location: [facility];  Service: Endoscopy;  Laterality: N/A;  9:15am  general anesthesia; intubation to control respirations. Allow double time for case per Dr. Gala Romney   MASTECTOMY, PARTIAL Left    benign   POLYPECTOMY  07/06/2017   Procedure: POLYPECTOMY;  Surgeon: Daneil Dolin, MD;  [facility];  Service: Endoscopy;;  cecal polyp cs, ascending colon polyp hs    POLYPECTOMY  02/08/2018   Procedure: POLYPECTOMY;  Surgeon: Daneil Dolin, MD;  Location: [facility];  Service: Endoscopy;;   TUBAL LIGATION      Social History   Socioeconomic History   Marital status: Single    Spouse name: Not on file   Number of children: Not on file   Years of education: Not on file   Highest education level: Not on file  Occupational History   Not on file  Social Needs   Financial resource strain: Not hard at all   Food insecurity    Worry: Never true    Inability: Never true   Transportation needs    Medical: No    Non-medical: No  Tobacco Use   Smoking status: Never Smoker   Smokeless tobacco: Never Used  Substance and Sexual Activity   Alcohol use: No   Drug use: No   Sexual activity: Not Currently    Birth control/protection: Post-menopausal  Lifestyle   Physical activity    Days per week: 0 days    Minutes per session: 0 min   Stress: Only a little  Relationships   Social connections    Talks on phone: More than three times a week    Gets together: More than three times a week    Attends religious service: More than 4 times per year    Active member of club or organization: Yes    Attends meetings of clubs or organizations: 1 to 4 times per year    Relationship status: Divorced   Intimate partner violence    Fear of current or ex partner: No    Emotionally abused: No    Physically abused: No    Forced sexual activity: No  Other Topics Concern   Not on file  Social History Narrative   Lives in [place], [place]. 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     Vitals:   12/12/18 1248  BP: 115/69  Pulse: 96  Temp: 98 F (36.7 C)  SpO2: 94%  Weight: 251 lb (113.9 kg)  Height: 5\' 4"  (1.626 m)    Wt Readings from Last 3 Encounters:  12/12/18 251 lb (113.9 kg)  10/23/18 262 lb (118.8 kg)  02/08/18 272 lb (123.4 kg)     PHYSICAL EXAM General: NAD, morbidly  obese HEENT: Normal. Neck: No JVD, no thyromegaly. Lungs: Clear to auscultation bilaterally with normal respiratory effort. CV: Regular rate and rhythm, normal S1/S2, no [id number], 2/6 systolicmurmur over RUSB. No pretibial or periankle edema.   Abdomen: Soft, nontender, obese.  Neurologic: Alert and oriented.  Psych: Normal affect. Skin: Normal. Musculoskeletal: No gross deformities.      Labs: Lab Results  Component Value Date/Time   K 5.2 (H) 10/25/2018 02:30 AM   BUN  38 (H) 10/25/2018 02:30 AM   CREATININE 1.39 (H) 10/25/2018 02:30 AM   CREATININE 1.04 (H) 12/08/2017 07:11 AM   ALT 20 10/25/2018 02:30 AM   TSH 1.22 11/17/2015 02:43 PM   HGB 9.1 (L) 10/24/2018 05:50 AM     Lipids: Lab Results  Component Value Date/Time   LDLCALC 69 12/08/2017 07:11 AM   CHOL 144 12/08/2017 07:11 AM   TRIG 88 10/23/2018 12:51 PM   HDL 42 (L) 12/08/2017 07:11 AM       ASSESSMENT AND PLAN:  1. Aortic stenosis: Symptomatically stable.  This is mild in severity with a mean gradient of 13 mmHg by echocardiogram on 04/12/2017. I will monitor with clinical surveillance and echocardiography.  2. Hypertension: Blood pressure isnormal. She is on losartan and hydrochlorothiazide.  No changes to therapy.  3. Hyperlipidemia: Lipidsreviewed above.   I will refill Lipitor 40 mg.  4. Insulin-dependent diabetes mellitus: Currently on insulin and metformin. HbA1c 7.1% on 10/24/18.  5. Abnormal nuclear stress test/presumed coronary artery disease: Symptomatically stable.  I will refill metoprolol.  She has several cardiovascular risk factors with both symptoms and stress test results suggestive of ischemic heart disease. I will refill atorvastatin.  She is also on aspirin 81 mg.   Disposition: Follow up 6 months   Kate Sable, M.D., F.A.C.C.

## 2018-12-13 ENCOUNTER — Ambulatory Visit (HOSPITAL_COMMUNITY)
Admission: RE | Admit: 2018-12-13 | Discharge: 2018-12-13 | Disposition: A | Discharge status: Home / Self Care | Payer: PPO | Source: Ambulatory Visit | Attending: Family Medicine | Admitting: Family Medicine

## 2018-12-13 DIAGNOSIS — Z1231 Encounter for screening mammogram for malignant neoplasm of breast: Secondary | ICD-10-CM | POA: Diagnosis not present

## 2018-12-25 DIAGNOSIS — I25118 Atherosclerotic heart disease of native coronary artery with other forms of angina pectoris: Secondary | ICD-10-CM | POA: Diagnosis not present

## 2018-12-25 DIAGNOSIS — E785 Hyperlipidemia, unspecified: Secondary | ICD-10-CM | POA: Diagnosis not present

## 2018-12-25 DIAGNOSIS — I119 Hypertensive heart disease without heart failure: Secondary | ICD-10-CM | POA: Diagnosis not present

## 2018-12-25 DIAGNOSIS — E118 Type 2 diabetes mellitus with unspecified complications: Secondary | ICD-10-CM | POA: Diagnosis not present

## 2018-12-25 DIAGNOSIS — D500 Iron deficiency anemia secondary to blood loss (chronic): Secondary | ICD-10-CM | POA: Diagnosis not present

## 2018-12-25 DIAGNOSIS — F324 Major depressive disorder, single episode, in partial remission: Secondary | ICD-10-CM | POA: Diagnosis not present

## 2018-12-25 DIAGNOSIS — I10 Essential (primary) hypertension: Secondary | ICD-10-CM | POA: Diagnosis not present

## 2018-12-26 ENCOUNTER — Other Ambulatory Visit: Payer: Self-pay

## 2018-12-26 DIAGNOSIS — Z20822 Contact with and (suspected) exposure to covid-19: Secondary | ICD-10-CM

## 2018-12-26 DIAGNOSIS — R6889 Other general symptoms and signs: Secondary | ICD-10-CM | POA: Diagnosis not present

## 2018-12-29 LAB — NOVEL CORONAVIRUS, NAA: SARS-CoV-2, NAA: NOT DETECTED

## 2019-04-29 ENCOUNTER — Ambulatory Visit: Payer: Medicare Other | Attending: Internal Medicine

## 2019-04-29 ENCOUNTER — Other Ambulatory Visit: Payer: Self-pay

## 2019-04-29 DIAGNOSIS — Z20822 Contact with and (suspected) exposure to covid-19: Secondary | ICD-10-CM

## 2019-05-01 LAB — NOVEL CORONAVIRUS, NAA: SARS-CoV-2, NAA: NOT DETECTED

## 2019-05-28 ENCOUNTER — Ambulatory Visit: Payer: Medicare Other | Attending: Internal Medicine

## 2019-05-28 DIAGNOSIS — Z23 Encounter for immunization: Secondary | ICD-10-CM | POA: Insufficient documentation

## 2019-06-25 ENCOUNTER — Ambulatory Visit: Payer: Medicare Other | Attending: Internal Medicine

## 2019-06-25 DIAGNOSIS — Z23 Encounter for immunization: Secondary | ICD-10-CM

## 2019-06-25 NOTE — Progress Notes (Signed)
   Covid-19 Vaccination Clinic  Name:  Ann Roberson    MRN: [id number] DOB: 07-10-1950  06/25/2019  [name] was observed post Covid-19 immunization for 15 minutes without incident. She was provided with Vaccine Information Sheet and instruction to access the V-Safe system.   [name] was instructed to call 911 with any severe reactions post vaccine: [name] Difficulty breathing  . Swelling of face and throat  . A fast heartbeat  . A bad rash all over body  . Dizziness and weakness   Immunizations Administered    Name Date Dose VIS Date Route   Moderna COVID-19 Vaccine 06/25/2019  9:38 AM 0.5 mL 02/26/2019 Intramuscular   Manufacturer: Moderna   Lot: [id number]   [place]hes[place]PO:[id number]

## 2019-12-19 ENCOUNTER — Other Ambulatory Visit: Payer: Self-pay

## 2019-12-19 MED ORDER — ATORVASTATIN CALCIUM 40 MG PO TABS: 40.0000 mg | ORAL_TABLET | Freq: Every day | ORAL | 0 refills | Status: DC

## 2019-12-19 MED ORDER — METOPROLOL TARTRATE 25 MG PO TABS: ORAL_TABLET | ORAL | 0 refills | Status: DC

## 2019-12-19 NOTE — Telephone Encounter (Signed)
refilled lopressor and atorvastain 30 day supply to [facility], pt needs f/u apt

## 2020-01-12 NOTE — Progress Notes (Signed)
Cardiology Office Note   Date:  01/13/2020   ID:  Ann Roberson, Ann Roberson 24-Jul-1950, MRN [id number]  PCP:  Caren Macadam, MD  Cardiologist:  Was Dr. Renaldo Reel   Chief Complaint  Patient presents with  . Coronary Artery Disease      History of Present Illness: Ann Roberson is a 69 y.o. female who presents for presumed coronary artery disease as well as hypertension and aortic stenosis, DM-2, HLD and hx of COVID 19 09/2018.  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  She contracted it after being in a home where the people had gone to [place] for the 4th of July.  Last visit 12/12/18 she has had mild left-sided chest pains but nothing requiring nitroglycerin.  Chronic exertional dyspnea is stable.  She walks with a cane as she has bilateral knee pain/arthritis.  nuc study was abnormal.  Last visit 11/2018 was stable. No cardiac cath   Last echo 03/2017 with mild LVH, EF 60-65%, G1DD, mild AS    Today no complaints, she has DOE but no change and some lower ext edema. She has Rt knee pain worse at times, has tried several meds on gabapentin.  Ortho wanted her to lose 50 lbs.  Use cane to walk.  No chest pain.   Labs with HDL 44 and LDL 36  A1C of 7.3   Past Medical History:  Diagnosis Date  . Anemia   . Arthritis   . Diabetes mellitus   . GERD (gastroesophageal reflux disease)   . Heart murmur   . Hyperlipidemia   . Hypertension   . Neuropathy   . PONV (postoperative nausea and vomiting)   . Sarcoidosis     Past Surgical History:  Procedure Laterality Date  . BIOPSY  02/08/2018   Procedure: BIOPSY;  Surgeon: Daneil Dolin, MD;  [facility];  Service: Endoscopy;;  . CESAREAN SECTION    . CHOLECYSTECTOMY    . COLONOSCOPY WITH PROPOFOL N/A 07/06/2017   Dr. Gala Romney: Diverticulosis.five 8 to 16 mm polyps in the ascending colon and in the cecum, removed with hot snare.  Largest polyp could not be  removed completely, status post inking.  Pathology revealed cecal tubular adenoma, a sending colon polyps with multiple fragments of tubular adenomas, tubulovillous adenoma, no high-grade dysplasia or malignancy.  Tubular adenomas removed from the rectum as   . COLONOSCOPY WITH PROPOFOL N/A 02/08/2018   Procedure: COLONOSCOPY WITH GENERAL;  Surgeon: Daneil Dolin, MD;  [facility];  Service: Endoscopy;  Laterality: N/A;  9:15am  general anesthesia; intubation to control respirations. Allow double time for case per Dr. Gala Romney  . MASTECTOMY, PARTIAL Left    benign  . POLYPECTOMY  07/06/2017   Procedure: POLYPECTOMY;  Surgeon: Daneil Dolin, MD;  [facility];  Service: Endoscopy;;  cecal polyp cs, ascending colon polyp hs  . POLYPECTOMY  02/08/2018   Procedure: POLYPECTOMY;  Surgeon: Daneil Dolin, MD;  [facility];  Service: Endoscopy;;  . TUBAL LIGATION       Current Outpatient Medications  Medication Sig Dispense Refill  . acetaminophen (TYLENOL) 650 MG CR tablet Take 1,300 mg by mouth 2 (two) times daily.    [name] amLODipine (NORVASC) 10 MG tablet Take 10 mg by mouth daily.    [name] atorvastatin (LIPITOR) 40 MG tablet Take 1 tablet (40 mg total) by mouth daily.  30 tablet 0  . B-D UF III MINI PEN NEEDLES 31G X 5 MM MISC USE WITH LANTUS PEN 100 each 0  . DULoxetine (CYMBALTA) 60 MG capsule Take 1 capsule by mouth daily.    [name] gabapentin (NEURONTIN) 300 MG capsule TAKE 2 CAPSULES BY MOUTH THREE TIMES DAILY 180 capsule 1  . hydrochlorothiazide (HYDRODIURIL) 25 MG tablet Take 25 mg by mouth daily.   1  . LANTUS SOLOSTAR 100 UNIT/ML Solostar Pen SMARTSIG:45 Unit(s) SUB-Q Every Night    . losartan (COZAAR) 100 MG tablet TAKE 1 TABLET BY MOUTH ONCE DAILY 90 tablet 0  . metFORMIN (GLUCOPHAGE) 1000 MG tablet TAKE 1 TABLET BY MOUTH TWICE DAILY 60 tablet 0  . metoprolol tartrate (LOPRESSOR) 25 MG tablet TAKE 1 TABLET BY MOUTH TWICE DAILY 30 tablet 0  . nitroGLYCERIN  (NITROSTAT) 0.4 MG SL tablet Place 1 tablet (0.4 mg total) under the tongue every 5 (five) minutes as needed. 25 tablet 3  . OZEMPIC, 0.25 OR 0.5 MG/DOSE, 2 MG/1.5ML SOPN Inject 0.5 mg into the skin once a week.    . potassium chloride SA (K-DUR,KLOR-CON) 20 MEQ tablet Take 1 tablet (20 mEq total) by mouth daily. 90 tablet 1   No current facility-administered medications for this visit.    Allergies:   Erythromycin    Social History:  The patient  reports that she has never smoked. She has never used smokeless tobacco. She reports that she does not drink alcohol and does not use drugs.   Family History:  The patient's family history includes Asthma in her brother; Cancer in her brother, father, and mother; Diabetes in her brother.    ROS:  General:no colds or fevers, no weight changes Skin:no rashes or ulcers HEENT:no blurred vision, no congestion CV:see HPI PUL:see HPI GI:no diarrhea constipation or melena, no indigestion GU:no hematuria, no dysuria MS:no joint pain, no claudication Neuro:no syncope, no lightheadedness Endo:+ diabetes, no thyroid disease  Wt Readings from Last 3 Encounters:  01/13/20 273 lb 6.4 oz (124 kg)  12/12/18 251 lb (113.9 kg)  10/23/18 262 lb (118.8 kg)     PHYSICAL EXAM: VS:  BP 138/78   Pulse 77   Ht 5\' 4"  (1.626 m)   Wt 273 lb 6.4 oz (124 kg)   SpO2 97%   BMI 46.93 kg/m  , BMI Body mass index is 46.93 kg/m. General:Pleasant affect, NAD Skin:Warm and dry, brisk capillary refill HEENT:normocephalic, sclera clear, mucus membranes moist Neck:supple, no JVD, Rt carotid bruit  Heart:S1S2 RRR with 3-6/6 systolic murmur, no gallup, rub or click Lungs:clear without rales, rhonchi, or wheezes QHU:TMLY, non tender, + BS, do not palpate liver spleen or masses Ext:+ lower ext edema at the ankles, 2+ pedal pulses, 2+ radial pulses Neuro:alert and oriented X 3, MAE, follows commands, + facial symmetry    EKG:  EKG is ordered today. The ekg ordered  today demonstrates SR with borderline LVH decrease in R wave but otherwise no changes.    Recent Labs: No results found for requested labs within last 8760 hours.    Lipid Panel    Component Value Date/Time   CHOL 144 12/08/2017 0711   TRIG 88 10/23/2018 1251   HDL 42 (L) 12/08/2017 0711   CHOLHDL 3.4 12/08/2017 0711   LDLCALC 69 12/08/2017 0711       Other studies Reviewed: Additional studies/ records that were reviewed today include: . Echo 04/12/17 Study Conclusions   - Left ventricle: The cavity size was normal. Wall  thickness was  increased in a pattern of mild LVH. Systolic function was normal.  The estimated ejection fraction was in the range of 60% to 65%.  Wall motion was normal; there were no regional wall motion  abnormalities. Doppler parameters are consistent with abnormal  left ventricular relaxation (grade 1 diastolic dysfunction).  Doppler parameters are consistent with high ventricular filling  pressure.  - Aortic valve: Trileaflet; mildly thickened, mildly calcified  leaflets. There was mild stenosis. Peak velocity (S): 248 cm/s.  Mean gradient (S): 13 mm Hg.  NUC study   No diagnostic ST segment changes to indicate ischemia.  Small, mild intensity, mid to apical anteroseptal defect that is fixed, more prominent on rest imaging and suggestive of soft tissue attenuation.  Medium, mild intensity, inferoseptal and inferolateral defect that partially reversible in the mid to apical distribution and fixed at the base suggestive of scar with mild to moderate peri-infarct ischemia.  This is an intermediate risk study.  Nuclear stress EF: 58%.     ASSESSMENT AND PLAN:  1.  CAD presumed with abnormal nuc study. Discussed if increased dyspnea then would recommend caridac cath.  Continue statin and BB  2.  AS no increase is dyspnea, no syncope.  Will re check echo 2-3/6 murmur  3.  HTN BP stable continue meds  4.  HLD last lipids with  LDL 36 continue statin   5.  DM-2 per IM  6.  Rt knee pain, arthritis.     Current medicines are reviewed with the patient today.  The patient Has no concerns regarding medicines.  The following changes have been made:  See above Labs/ tests ordered today include:see above  Disposition:   FU:  see above  Signed, Cecilie Kicks, NP  01/13/2020 8:51 AM    [facility] [address], [place], [place], [place] Phone: [phone]; Fax: [phone]

## 2020-01-13 ENCOUNTER — Ambulatory Visit (INDEPENDENT_AMBULATORY_CARE_PROVIDER_SITE_OTHER): Payer: Medicare Other | Admitting: Cardiology

## 2020-01-13 ENCOUNTER — Other Ambulatory Visit: Payer: Self-pay

## 2020-01-13 ENCOUNTER — Encounter: Payer: Self-pay | Admitting: Cardiology

## 2020-01-13 VITALS — BP 138/78 | HR 77 | Ht 64.0 in | Wt 273.4 lb

## 2020-01-13 DIAGNOSIS — E785 Hyperlipidemia, unspecified: Secondary | ICD-10-CM | POA: Diagnosis not present

## 2020-01-13 DIAGNOSIS — Z794 Long term (current) use of insulin: Secondary | ICD-10-CM

## 2020-01-13 DIAGNOSIS — E1169 Type 2 diabetes mellitus with other specified complication: Secondary | ICD-10-CM

## 2020-01-13 DIAGNOSIS — I25118 Atherosclerotic heart disease of native coronary artery with other forms of angina pectoris: Secondary | ICD-10-CM

## 2020-01-13 DIAGNOSIS — I10 Essential (primary) hypertension: Secondary | ICD-10-CM | POA: Diagnosis not present

## 2020-01-13 DIAGNOSIS — I350 Nonrheumatic aortic (valve) stenosis: Secondary | ICD-10-CM | POA: Diagnosis not present

## 2020-01-13 MED ORDER — METOPROLOL TARTRATE 25 MG PO TABS: ORAL_TABLET | ORAL | 3 refills | Status: DC

## 2020-01-13 NOTE — Patient Instructions (Signed)
Medication Instructions:  Your physician recommends that you continue on your current medications as directed. Please refer to the Current Medication list given to you today.  *If you need a refill on your cardiac medications before your next appointment, please call your pharmacy*   Lab Work: None ordered  If you have labs (blood work) drawn today and your tests are completely normal, you will receive your results only by: [name] MyChart Message (if you have MyChart) OR . A paper copy in the mail If you have any lab test that is abnormal or we need to change your treatment, we will call you to review the results.   Testing/Procedures: Your physician has requested that you have an echocardiogram. Echocardiography is a painless test that uses sound waves to create images of your heart. It provides your doctor with information about the size and shape of your heart and how well your heart's chambers and valves are working. This procedure takes approximately one hour. There are no restrictions for this procedure.     Follow-Up: At [facility], you and your health needs are our priority.  As part of our continuing mission to provide you with exceptional heart care, we have created designated Provider Care Teams.  These Care Teams include your primary Cardiologist (physician) and Advanced Practice Providers (APPs -  Physician Assistants and Nurse Practitioners) who all work together to provide you with the care you need, when you need it.  We recommend signing up for the patient portal called "MyChart".  Sign up information is provided on this After Visit Summary.  MyChart is used to connect with patients for Virtual Visits (Telemedicine).  Patients are able to view lab/test results, encounter notes, upcoming appointments, etc.  Non-urgent messages can be sent to your provider as well.   To learn more about what you can do with MyChart, go to [email or web address].    Your next appointment:     12 year(s)  The format for your next appointment:   In Person  Provider:   Rozann Lesches, MD   Other Instructions  Echocardiogram An echocardiogram is a procedure that uses painless sound waves (ultrasound) to produce an image of the heart. Images from an echocardiogram can provide important information about:  Signs of coronary artery disease (CAD).  Aneurysm detection. An aneurysm is a weak or damaged part of an artery wall that bulges out from the normal force of blood pumping through the body.  Heart size and shape. Changes in the size or shape of the heart can be associated with certain conditions, including heart failure, aneurysm, and CAD.  Heart muscle function.  Heart valve function.  Signs of a past heart attack.  Fluid buildup around the heart.  Thickening of the heart muscle.  A tumor or infectious growth around the heart valves. Tell a health care provider about:  Any allergies you have.  All medicines you are taking, including vitamins, herbs, eye drops, creams, and over-the-counter medicines.  Any blood disorders you have.  Any surgeries you have had.  Any medical conditions you have.  Whether you are pregnant or may be pregnant. What are the risks? Generally, this is a safe procedure. However, problems may occur, including:  Allergic reaction to dye (contrast) that may be used during the procedure. What happens before the procedure? No specific preparation is needed. You may eat and drink normally. What happens during the procedure?   An IV tube may be inserted into one of your veins.  You  may receive contrast through this tube. A contrast is an injection that improves the quality of the pictures from your heart.  A gel will be applied to your chest.  A wand-like tool (transducer) will be moved over your chest. The gel will help to transmit the sound waves from the transducer.  The sound waves will harmlessly bounce off of your heart to  allow the heart images to be captured in real-time motion. The images will be recorded on a computer. The procedure may vary among health care providers and hospitals. What happens after the procedure?  You may return to your normal, everyday life, including diet, activities, and medicines, unless your health care provider tells you not to do that. Summary  An echocardiogram is a procedure that uses painless sound waves (ultrasound) to produce an image of the heart.  Images from an echocardiogram can provide important information about the size and shape of your heart, heart muscle function, heart valve function, and fluid buildup around your heart.  You do not need to do anything to prepare before this procedure. You may eat and drink normally.  After the echocardiogram is completed, you may return to your normal, everyday life, unless your health care provider tells you not to do that. This information is not intended to replace advice given to you by your health care provider. Make sure you discuss any questions you have with your health care provider. Document Revised: 07/05/2018 Document Reviewed: 04/16/2016 Elsevier Patient Education  [place].

## 2020-01-15 ENCOUNTER — Other Ambulatory Visit: Payer: Self-pay

## 2020-01-15 ENCOUNTER — Ambulatory Visit (HOSPITAL_COMMUNITY)
Admission: RE | Admit: 2020-01-15 | Discharge: 2020-01-15 | Disposition: A | Discharge status: Home / Self Care | Payer: Medicare Other | Source: Ambulatory Visit | Attending: Cardiology | Admitting: Cardiology

## 2020-01-15 DIAGNOSIS — I350 Nonrheumatic aortic (valve) stenosis: Secondary | ICD-10-CM | POA: Insufficient documentation

## 2020-01-15 LAB — ECHOCARDIOGRAM COMPLETE
AR max vel: 1.16 cm2
AV Area VTI: 1 cm2
AV Area mean vel: 1.13 cm2
AV Mean grad: 12 mmHg
AV Peak grad: 22.3 mmHg
Ao pk vel: 2.36 m/s
Area-P 1/2: 3.05 cm2
S' Lateral: 3.04 cm

## 2020-01-15 NOTE — Progress Notes (Signed)
*  PRELIMINARY RESULTS* Echocardiogram 2D Echocardiogram has been performed.  Ann Roberson 01/15/2020, 11:27 AM

## 2020-01-23 ENCOUNTER — Telehealth: Payer: Self-pay

## 2020-01-23 DIAGNOSIS — I350 Nonrheumatic aortic (valve) stenosis: Secondary | ICD-10-CM

## 2020-01-23 NOTE — Telephone Encounter (Signed)
-----   Message from Isaiah Serge, NP sent at 01/22/2020  4:33 PM EDT ----- Follow up in 6 months with Dr. Domenic Polite or [facility] and have limited Echo for aortic valve disease.  Let her know the echo is about the same but some fluid around heart and we just want to monitor.

## 2020-01-23 NOTE — Telephone Encounter (Signed)
Pt notified, echo order placed, will message front desk to place in 6 month recall.

## 2020-01-30 ENCOUNTER — Other Ambulatory Visit: Payer: Self-pay | Admitting: Student

## 2020-02-07 ENCOUNTER — Encounter: Payer: Self-pay | Admitting: Internal Medicine

## 2020-04-08 ENCOUNTER — Ambulatory Visit (INDEPENDENT_AMBULATORY_CARE_PROVIDER_SITE_OTHER): Payer: Medicare Other | Admitting: Orthopaedic Surgery

## 2020-04-08 ENCOUNTER — Encounter: Payer: Self-pay | Admitting: Orthopaedic Surgery

## 2020-04-08 ENCOUNTER — Ambulatory Visit: Payer: Self-pay

## 2020-04-08 ENCOUNTER — Other Ambulatory Visit: Payer: Self-pay

## 2020-04-08 VITALS — Ht 64.0 in | Wt 271.0 lb

## 2020-04-08 DIAGNOSIS — M170 Bilateral primary osteoarthritis of knee: Secondary | ICD-10-CM | POA: Insufficient documentation

## 2020-04-08 DIAGNOSIS — G8929 Other chronic pain: Secondary | ICD-10-CM | POA: Diagnosis not present

## 2020-04-08 DIAGNOSIS — M25561 Pain in right knee: Secondary | ICD-10-CM | POA: Diagnosis not present

## 2020-04-08 DIAGNOSIS — M25562 Pain in left knee: Secondary | ICD-10-CM | POA: Diagnosis not present

## 2020-04-08 MED ORDER — BUPIVACAINE HCL 0.5 % IJ SOLN
2.0000 mL | INTRAMUSCULAR | Status: AC | PRN
  Administered 2020-04-08: 2 mL via INTRA_ARTICULAR

## 2020-04-08 MED ORDER — LIDOCAINE HCL 1 % IJ SOLN
2.0000 mL | INTRAMUSCULAR | Status: AC | PRN
  Administered 2020-04-08: 2 mL

## 2020-04-08 NOTE — Progress Notes (Signed)
Office Visit Note   Patient: Ann Roberson           Date of Birth: 07/18/1950           MRN: [id number] Visit Date: 04/08/2020              Requested by: Caren Macadam, MD [address],  [place] [address] PCP: Caren Macadam, MD   Assessment & Plan: Visit Diagnoses:  1. Chronic pain of both knees   2. Bilateral primary osteoarthritis of knee   3. Morbid obesity due to excess calories (HCC)     Plan: End-stage osteoarthritis both knees with bone-on-bone in the medial compartment.  Long discussion regarding her diagnosis and treatment options.  Her BMI is 46-1/2 which precludes surgery.  She has multiple other comorbidities including diabetes.  We will inject the left knee with betamethasone and reevaluate in 2 weeks for consideration of injecting the right knee.  Follow-Up Instructions: Return in about 2 weeks (around 04/22/2020).   Orders:  Orders Placed This Encounter  Procedures  . Large Joint Inj: L knee  . XR KNEE 3 VIEW LEFT  . XR KNEE 3 VIEW RIGHT   No orders of the defined types were placed in this encounter.     Procedures: Large Joint Inj: L knee on 04/08/2020 10:13 AM Indications: pain and diagnostic evaluation Details: 25 G 1.5 in needle, anteromedial approach  Arthrogram: No  Medications: 2 mL lidocaine 1 %; 2 mL bupivacaine 0.5 %  2 mL of betamethasone injected with a Marcaine and Xylocaine along the medial compartment left knee Procedure, treatment alternatives, risks and benefits explained, specific risks discussed. Consent was given by the patient. Patient was prepped and draped in the usual sterile fashion.       Clinical Data: No additional findings.   Subjective: Chief Complaint  Patient presents with  . Right Knee - Pain  . Left Knee - Pain  Patient presents today for bilateral knee pain. She said that the left side is worse than the right. Level 9 pain. She experiences pain, popping, and the sensation that they will give  way. She takes Tylenol Arthritis, Voltaren Gel, and Icy hot. It helps very little, if any. She is wanting to get cortisone injections today if possible. She is diabetic.  Presently taking metformin, Lantus insulin and Ozempic.  Has history of diabetic neuropathy and is also on gabapentin HPI  Review of Systems   Objective: Vital Signs: Ht 5\' 4"  (1.626 m)   Wt 271 lb (122.9 kg)   BMI 46.52 kg/m   Physical Exam Constitutional:      Appearance: She is well-developed and well-nourished.  HENT:     Mouth/Throat:     Mouth: Oropharynx is clear and moist.  Eyes:     Extraocular Movements: EOM normal.     Pupils: Pupils are equal, round, and reactive to light.  Pulmonary:     Effort: Pulmonary effort is normal.  Skin:    General: Skin is warm and dry.  Neurological:     Mental Status: She is alert and oriented to person, place, and time.  Psychiatric:        Mood and Affect: Mood and affect normal.        Behavior: Behavior normal.     Ortho Exam awake alert and oriented x3.  Comfortable sitting.  He is a cane for ambulation.  Very large legs.  Has predominant medial joint pain bilaterally.  Difficult to tell  if there is an effusion based on the size of her legs.  Increased varus with weightbearing.  Does have some decreased sensibility in both feet consistent with her neuropathy.  Feet were warm.  No calf pain.  Full extension at about 95 degrees of flexion bilaterally.  No instability.  Positive patellar crepitation.  No pain with range of motion of either hip  Specialty Comments:  No specialty comments available.  Imaging: XR KNEE 3 VIEW LEFT  Result Date: 04/08/2020 Films of the left knee were obtained in 3 projections standing.  They are very similar to to the right knee with end-stage osteoarthritis.  There is bone-on-bone in the medial compartment with peripheral osteophytes, subchondral sclerosis and subchondral cysts.  No ectopic calcification.  Degenerative changes at the  patellofemoral joint and lateral compartment as well.  Films are consistent with end-stage osteoarthritis.  No evidence of CPPD  XR KNEE 3 VIEW RIGHT  Result Date: 04/08/2020 Films of the right knee were obtained in 3 projections standing.  Tricompartmental degenerative changes predominantly in the medial compartment.  Bone-on-bone with subchondral sclerosis and peripheral osteophytes medially.  Osteophytes laterally but the joint space is open.  Approximately 9 degrees of varus.  Significant degenerative changes the patellofemoral joint with osteophytes as well.  Films are consistent with end-stage osteoarthritis.  No acute changes    PMFS History: Patient Active Problem List   Diagnosis Date Noted  . Bilateral primary osteoarthritis of knee 04/08/2020  . Nausea vomiting and diarrhea 10/24/2018  . COVID-19 virus infection 10/23/2018  . DOE (dyspnea on exertion) 10/23/2018  . Tubulovillous adenoma of colon 01/09/2018  . Excessive ear wax 06/25/2017  . Knee osteoarthritis 06/17/2017  . Anemia in other chronic diseases classified elsewhere 06/17/2017  . Type 2 diabetes mellitus with other specified complication ([place]) 123XX123  . Upper airway cough syndrome 05/02/2017  . Morbid obesity due to excess calories ([place]) 05/02/2017  . Dyspnea on exertion 05/01/2017  . Hypertension associated with diabetes ([place]) 04/06/2017  . Hyperlipidemia associated with type 2 diabetes mellitus ([place]) 04/06/2017  . Urticaria 11/17/2015  . Angioedema 11/17/2015  . Pain in joint, lower leg 12/10/2013   Past Medical History:  Diagnosis Date  . Anemia   . Arthritis   . Diabetes mellitus   . GERD (gastroesophageal reflux disease)   . Heart murmur   . Hyperlipidemia   . Hypertension   . Neuropathy   . PONV (postoperative nausea and vomiting)   . Sarcoidosis     Family History  Problem Relation Age of Onset  . Asthma Brother   . Cancer Mother   . Cancer Father   . Cancer Brother   . Diabetes Brother    . Allergic rhinitis Neg Hx   . Angioedema Neg Hx   . Atopy Neg Hx   . Eczema Neg Hx   . Immunodeficiency Neg Hx   . Urticaria Neg Hx     Past Surgical History:  Procedure Laterality Date  . BIOPSY  02/08/2018   Procedure: BIOPSY;  Surgeon: Daneil Dolin, MD;  [facility];  Service: Endoscopy;;  . CESAREAN SECTION    . CHOLECYSTECTOMY    . COLONOSCOPY WITH PROPOFOL N/A 07/06/2017   Dr. Gala Romney: Diverticulosis.five 8 to 16 mm polyps in the ascending colon and in the cecum, removed with hot snare.  Largest polyp could not be removed completely, status post inking.  Pathology revealed cecal tubular adenoma, a sending colon polyps with multiple fragments of tubular adenomas,  tubulovillous adenoma, no high-grade dysplasia or malignancy.  Tubular adenomas removed from the rectum as   . COLONOSCOPY WITH PROPOFOL N/A 02/08/2018   Procedure: COLONOSCOPY WITH GENERAL;  Surgeon: Daneil Dolin, MD;  [facility];  Service: Endoscopy;  Laterality: N/A;  9:15am  general anesthesia; intubation to control respirations. Allow double time for case per Dr. Gala Romney  . MASTECTOMY, PARTIAL Left    benign  . POLYPECTOMY  07/06/2017   Procedure: POLYPECTOMY;  Surgeon: Daneil Dolin, MD;  [facility];  Service: Endoscopy;;  cecal polyp cs, ascending colon polyp hs  . POLYPECTOMY  02/08/2018   Procedure: POLYPECTOMY;  Surgeon: Daneil Dolin, MD;  [facility];  Service: Endoscopy;;  . TUBAL LIGATION     Social History   Occupational History  . Not on file  Tobacco Use  . Smoking status: Never Smoker  . Smokeless tobacco: Never Used  Vaping Use  . Vaping Use: Never used  Substance and Sexual Activity  . Alcohol use: No  . Drug use: No  . Sexual activity: Not Currently    Birth control/protection: Post-menopausal

## 2020-04-13 ENCOUNTER — Ambulatory Visit: Payer: Medicare Other | Admitting: Gastroenterology

## 2020-04-22 ENCOUNTER — Encounter: Payer: Self-pay | Admitting: Orthopaedic Surgery

## 2020-04-22 ENCOUNTER — Other Ambulatory Visit: Payer: Self-pay

## 2020-04-22 ENCOUNTER — Ambulatory Visit (INDEPENDENT_AMBULATORY_CARE_PROVIDER_SITE_OTHER): Payer: Medicare Other | Admitting: Orthopaedic Surgery

## 2020-04-22 VITALS — Ht 64.0 in | Wt 272.0 lb

## 2020-04-22 DIAGNOSIS — E785 Hyperlipidemia, unspecified: Secondary | ICD-10-CM | POA: Diagnosis not present

## 2020-04-22 DIAGNOSIS — I25118 Atherosclerotic heart disease of native coronary artery with other forms of angina pectoris: Secondary | ICD-10-CM | POA: Diagnosis not present

## 2020-04-22 DIAGNOSIS — M170 Bilateral primary osteoarthritis of knee: Secondary | ICD-10-CM

## 2020-04-22 DIAGNOSIS — D500 Iron deficiency anemia secondary to blood loss (chronic): Secondary | ICD-10-CM | POA: Diagnosis not present

## 2020-04-22 DIAGNOSIS — I119 Hypertensive heart disease without heart failure: Secondary | ICD-10-CM | POA: Diagnosis not present

## 2020-04-22 DIAGNOSIS — I10 Essential (primary) hypertension: Secondary | ICD-10-CM | POA: Diagnosis not present

## 2020-04-22 DIAGNOSIS — E118 Type 2 diabetes mellitus with unspecified complications: Secondary | ICD-10-CM | POA: Diagnosis not present

## 2020-04-22 MED ORDER — BUPIVACAINE HCL 0.5 % IJ SOLN
2.0000 mL | INTRAMUSCULAR | Status: AC | PRN
  Administered 2020-04-22: 2 mL via INTRA_ARTICULAR

## 2020-04-22 MED ORDER — LIDOCAINE HCL 1 % IJ SOLN
2.0000 mL | INTRAMUSCULAR | Status: AC | PRN
  Administered 2020-04-22: 2 mL

## 2020-04-22 NOTE — Progress Notes (Signed)
Office Visit Note   Patient: Ann Roberson           Date of Birth: 07-22-1950           MRN: [id number] Visit Date: 04/22/2020              Requested by: Caren Macadam, MD [place],  [place] [address] PCP: Caren Macadam, MD   Assessment & Plan: Visit Diagnoses:  1. Bilateral primary osteoarthritis of knee     Plan: Seen 2 weeks ago for evaluation of bilateral knee pain with evidence of significant osteoarthritis.  I injected the left knee with betamethasone with excellent response.  She is here today to have the right knee injected. Follow-Up Instructions: Return if symptoms worsen or fail to improve.   Orders:  Orders Placed This Encounter  Procedures  . Large Joint Inj: R knee   No orders of the defined types were placed in this encounter.     Procedures: Large Joint Inj: R knee on 04/22/2020 1:59 PM Indications: pain and diagnostic evaluation Details: 25 G 1.5 in needle, anteromedial approach  Arthrogram: No  Medications: 2 mL lidocaine 1 %; 2 mL bupivacaine 0.5 %  12 mg betamethasone injected into right knee with Marcaine and Xylocaine Procedure, treatment alternatives, risks and benefits explained, specific risks discussed. Consent was given by the patient. Immediately prior to procedure a time out was called to verify the correct patient, procedure, equipment, support staff and site/side marked as required. Patient was prepped and draped in the usual sterile fashion.       Clinical Data: No additional findings.   Subjective: Chief Complaint  Patient presents with  . Right Knee - Pain  . Left Knee - Pain, Follow-up  Patient returns for two week follow up bilateral knee pain. She had left knee cortisone injection on 04/08/2020 with great relief. She requests right knee injection today.  HPI  Review of Systems   Objective: Vital Signs: Ht 5\' 4"  (1.626 m)   Wt 272 lb (123.4 kg)   BMI 46.69 kg/m   Physical Exam Constitutional:       Appearance: She is well-developed and well-nourished.  HENT:     Mouth/Throat:     Mouth: Oropharynx is clear and moist.  Eyes:     Extraocular Movements: EOM normal.     Pupils: Pupils are equal, round, and reactive to light.  Pulmonary:     Effort: Pulmonary effort is normal.  Skin:    General: Skin is warm and dry.  Neurological:     Mental Status: She is alert and oriented to person, place, and time.  Psychiatric:        Mood and Affect: Mood and affect normal.        Behavior: Behavior normal.     Ortho Exam left knee was not hot warm or swollen no localized area of tenderness.  Right knee had a very small effusion and mostly medial joint pain today.  Does use a cane to help with her ambulation.  Knees.  BMI is 47.  No instability Specialty Comments:  No specialty comments available.  Imaging: No results found.   PMFS History: Patient Active Problem List   Diagnosis Date Noted  . Bilateral primary osteoarthritis of knee 04/08/2020  . Nausea vomiting and diarrhea 10/24/2018  . COVID-19 virus infection 10/23/2018  . DOE (dyspnea on exertion) 10/23/2018  . Tubulovillous adenoma of colon 01/09/2018  . Excessive ear wax 06/25/2017  .  Knee osteoarthritis 06/17/2017  . Anemia in other chronic diseases classified elsewhere 06/17/2017  . Type 2 diabetes mellitus with other specified complication ([place]) 24/58/0998  . Upper airway cough syndrome 05/02/2017  . Morbid obesity due to excess calories ([place]) 05/02/2017  . Dyspnea on exertion 05/01/2017  . Hypertension associated with diabetes ([place]) 04/06/2017  . Hyperlipidemia associated with type 2 diabetes mellitus ([place]) 04/06/2017  . Urticaria 11/17/2015  . Angioedema 11/17/2015  . Pain in joint, lower leg 12/10/2013   Past Medical History:  Diagnosis Date  . Anemia   . Arthritis   . Diabetes mellitus   . GERD (gastroesophageal reflux disease)   . Heart murmur   . Hyperlipidemia   . Hypertension   . Neuropathy    . PONV (postoperative nausea and vomiting)   . Sarcoidosis     Family History  Problem Relation Age of Onset  . Asthma Brother   . Cancer Mother   . Cancer Father   . Cancer Brother   . Diabetes Brother   . Allergic rhinitis Neg Hx   . Angioedema Neg Hx   . Atopy Neg Hx   . Eczema Neg Hx   . Immunodeficiency Neg Hx   . Urticaria Neg Hx     Past Surgical History:  Procedure Laterality Date  . BIOPSY  02/08/2018   Procedure: BIOPSY;  Surgeon: Daneil Dolin, MD;  [facility];  Service: Endoscopy;;  . CESAREAN SECTION    . CHOLECYSTECTOMY    . COLONOSCOPY WITH PROPOFOL N/A 07/06/2017   Dr. Gala Romney: Diverticulosis.five 8 to 16 mm polyps in the ascending colon and in the cecum, removed with hot snare.  Largest polyp could not be removed completely, status post inking.  Pathology revealed cecal tubular adenoma, a sending colon polyps with multiple fragments of tubular adenomas, tubulovillous adenoma, no high-grade dysplasia or malignancy.  Tubular adenomas removed from the rectum as   . COLONOSCOPY WITH PROPOFOL N/A 02/08/2018   Procedure: COLONOSCOPY WITH GENERAL;  Surgeon: Daneil Dolin, MD;  [facility];  Service: Endoscopy;  Laterality: N/A;  9:15am  general anesthesia; intubation to control respirations. Allow double time for case per Dr. Gala Romney  . MASTECTOMY, PARTIAL Left    benign  . POLYPECTOMY  07/06/2017   Procedure: POLYPECTOMY;  Surgeon: Daneil Dolin, MD;  [facility];  Service: Endoscopy;;  cecal polyp cs, ascending colon polyp hs  . POLYPECTOMY  02/08/2018   Procedure: POLYPECTOMY;  Surgeon: Daneil Dolin, MD;  [facility];  Service: Endoscopy;;  . TUBAL LIGATION     Social History   Occupational History  . Not on file  Tobacco Use  . Smoking status: Never Smoker  . Smokeless tobacco: Never Used  Vaping Use  . Vaping Use: Never used  Substance and Sexual Activity  . Alcohol use: No  . Drug use: No  . Sexual  activity: Not Currently    Birth control/protection: Post-menopausal

## 2020-04-27 DIAGNOSIS — R809 Proteinuria, unspecified: Secondary | ICD-10-CM | POA: Diagnosis not present

## 2020-04-27 DIAGNOSIS — E1122 Type 2 diabetes mellitus with diabetic chronic kidney disease: Secondary | ICD-10-CM | POA: Diagnosis not present

## 2020-04-27 DIAGNOSIS — M170 Bilateral primary osteoarthritis of knee: Secondary | ICD-10-CM | POA: Diagnosis not present

## 2020-04-27 DIAGNOSIS — I129 Hypertensive chronic kidney disease with stage 1 through stage 4 chronic kidney disease, or unspecified chronic kidney disease: Secondary | ICD-10-CM | POA: Diagnosis not present

## 2020-04-27 DIAGNOSIS — N1832 Chronic kidney disease, stage 3b: Secondary | ICD-10-CM | POA: Diagnosis not present

## 2020-04-28 DIAGNOSIS — I10 Essential (primary) hypertension: Secondary | ICD-10-CM | POA: Diagnosis not present

## 2020-04-28 DIAGNOSIS — I25118 Atherosclerotic heart disease of native coronary artery with other forms of angina pectoris: Secondary | ICD-10-CM | POA: Diagnosis not present

## 2020-04-28 DIAGNOSIS — M170 Bilateral primary osteoarthritis of knee: Secondary | ICD-10-CM | POA: Diagnosis not present

## 2020-04-28 DIAGNOSIS — E785 Hyperlipidemia, unspecified: Secondary | ICD-10-CM | POA: Diagnosis not present

## 2020-04-28 DIAGNOSIS — E118 Type 2 diabetes mellitus with unspecified complications: Secondary | ICD-10-CM | POA: Diagnosis not present

## 2020-04-28 DIAGNOSIS — I119 Hypertensive heart disease without heart failure: Secondary | ICD-10-CM | POA: Diagnosis not present

## 2020-04-28 DIAGNOSIS — D500 Iron deficiency anemia secondary to blood loss (chronic): Secondary | ICD-10-CM | POA: Diagnosis not present

## 2020-05-18 ENCOUNTER — Encounter: Payer: Self-pay | Admitting: Gastroenterology

## 2020-05-18 ENCOUNTER — Encounter: Payer: Self-pay | Admitting: *Deleted

## 2020-05-18 ENCOUNTER — Other Ambulatory Visit: Payer: Self-pay

## 2020-05-18 ENCOUNTER — Telehealth: Payer: Self-pay | Admitting: *Deleted

## 2020-05-18 ENCOUNTER — Ambulatory Visit (INDEPENDENT_AMBULATORY_CARE_PROVIDER_SITE_OTHER): Payer: Medicare Other | Admitting: Gastroenterology

## 2020-05-18 DIAGNOSIS — Z86010 Personal history of colonic polyps: Secondary | ICD-10-CM

## 2020-05-18 DIAGNOSIS — Z860101 Personal history of adenomatous and serrated colon polyps: Secondary | ICD-10-CM | POA: Insufficient documentation

## 2020-05-18 MED ORDER — PEG 3350-KCL-NA BICARB-NACL 420 G PO SOLR: ORAL | 0 refills | Status: DC

## 2020-05-18 NOTE — Telephone Encounter (Signed)
Called pt. She has been scheduled for TCS with general anesthesia on 4/11 am appt. Aware will mail prep instructions with pre-op/covid test appt. Per endo will need extra 15 min for procedure.

## 2020-05-18 NOTE — Patient Instructions (Signed)
Colonoscopy as scheduled.  Please see separate instructions.

## 2020-05-18 NOTE — Progress Notes (Signed)
Cc'ed to pcp °

## 2020-05-18 NOTE — Progress Notes (Addendum)
Primary Care Physician:  Caren Macadam, MD  Primary Gastroenterologist:  Garfield Cornea, MD   Chief Complaint  Patient presents with  . Colonoscopy    Due for tcs; doing ok    HPI:  Ann Roberson is a 70 y.o. female here to schedule surveillance colonoscopy.    She had a colonoscopy in April 2019 with five semipedunculated polyps in the ascending colon and cecum measuring 8 to 16 mm.  The largest polyp could not be completely removed, status post inking.  Pathology revealed tubulovillous adenoma with no high-grade dysplasia in the ascending colon polyp, other polyps were tubular adenomas.  It was advised she have a 29-month follow-up colonoscopy with general anesthesia/intubation to control respirations so that the polyp that was incompletely removed could be completely resected with better control.  She completed colonoscopy November 2019 at which time she had a 12 mm polyp (tubular adenoma) at the ileocecal valve removed.  Possible residual polyp tissue at the ascending colon segment which was previously tattooed.  Status post biopsy (tubular adenoma) and ablation.  Sigmoid polyp removed.  2-year surveillance colonoscopy recommended.  Patient states she had Covid back in 2020, presented mostly with GI symptoms.  Was hospitalized at [facility] at the time.  Otherwise she has been doing well since we last saw her.  Bowel movements are regular.  No blood in the stool or melena.  No appetite concerns.  Weight is stable.  No abdominal pain.  No upper GI symptoms.     Current Outpatient Medications  Medication Sig Dispense Refill  . acetaminophen (TYLENOL) 650 MG CR tablet Take 1,300 mg by mouth 2 (two) times daily.    [name] amLODipine (NORVASC) 10 MG tablet Take 10 mg by mouth daily.    [name] atorvastatin (LIPITOR) 40 MG tablet TAKE ONE TABLET BY MOUTH ONCE DAILY 30 tablet 10  . B-D UF III MINI PEN NEEDLES 31G X 5 MM MISC USE WITH LANTUS PEN 100 each 0  . DULoxetine (CYMBALTA) 60 MG  capsule Take 1 capsule by mouth daily.    [name] gabapentin (NEURONTIN) 300 MG capsule TAKE 2 CAPSULES BY MOUTH THREE TIMES DAILY 180 capsule 1  . hydrochlorothiazide (HYDRODIURIL) 25 MG tablet Take 25 mg by mouth daily.   1  . LANTUS SOLOSTAR 100 UNIT/ML Solostar Pen Inject 50 Units into the skin at bedtime.    [name] losartan (COZAAR) 100 MG tablet TAKE 1 TABLET BY MOUTH ONCE DAILY 90 tablet 0  . metFORMIN (GLUCOPHAGE) 1000 MG tablet TAKE 1 TABLET BY MOUTH TWICE DAILY 60 tablet 0  . metoprolol tartrate (LOPRESSOR) 25 MG tablet TAKE 1 TABLET BY MOUTH TWICE DAILY 180 tablet 3  . nitroGLYCERIN (NITROSTAT) 0.4 MG SL tablet Place 1 tablet (0.4 mg total) under the tongue every 5 (five) minutes as needed. 25 tablet 3  . OZEMPIC, 0.25 OR 0.5 MG/DOSE, 2 MG/1.5ML SOPN Inject 0.5 mg into the skin once a week.    . potassium chloride SA (K-DUR,KLOR-CON) 20 MEQ tablet Take 1 tablet (20 mEq total) by mouth daily. 90 tablet 1   No current facility-administered medications for this visit.    Allergies as of 05/18/2020 - Review Complete 05/18/2020  Allergen Reaction Noted  . Erythromycin Nausea And Vomiting 01/29/2011    Past Medical History:  Diagnosis Date  . Anemia   . Arthritis   . Diabetes mellitus   . GERD (gastroesophageal reflux disease)   . Heart murmur   . Hyperlipidemia   . Hypertension   .  Neuropathy   . PONV (postoperative nausea and vomiting)   . Sarcoidosis     Past Surgical History:  Procedure Laterality Date  . BIOPSY  02/08/2018   Procedure: BIOPSY;  Surgeon: Daneil Dolin, MD;  [facility];  Service: Endoscopy;;  . CESAREAN SECTION    . CHOLECYSTECTOMY    . COLONOSCOPY WITH PROPOFOL N/A 07/06/2017   Dr. Gala Romney: Diverticulosis.five 8 to 16 mm polyps in the ascending colon and in the cecum, removed with hot snare.  Largest polyp could not be removed completely, status post inking.  Pathology revealed cecal tubular adenoma, a sending colon polyps with multiple fragments of  tubular adenomas, tubulovillous adenoma, no high-grade dysplasia or malignancy.  Tubular adenomas removed from the rectum as   . COLONOSCOPY WITH PROPOFOL N/A 02/08/2018   12 mm tubular adenoma removed from the ileocecal valve, hyperplastic sigmoid colon polyp removed.  Possible residual polypoid tissue at the ascending colon segment previously tattooed, biopsied (tubular adenoma) and ablated with APC.  Recommend 2-year surveillance study.  [name] MASTECTOMY, PARTIAL Left    benign  . POLYPECTOMY  07/06/2017   Procedure: POLYPECTOMY;  Surgeon: Daneil Dolin, MD;  [facility];  Service: Endoscopy;;  cecal polyp cs, ascending colon polyp hs  . POLYPECTOMY  02/08/2018   Procedure: POLYPECTOMY;  Surgeon: Daneil Dolin, MD;  [facility];  Service: Endoscopy;;  . TUBAL LIGATION      Family History  Problem Relation Age of Onset  . Asthma Brother   . Cancer Mother        stomach  . Cancer Father        prostate  . Cancer Brother        ?  . Diabetes Brother   . Allergic rhinitis Neg Hx   . Angioedema Neg Hx   . Atopy Neg Hx   . Eczema Neg Hx   . Immunodeficiency Neg Hx   . Urticaria Neg Hx   . Colon cancer Neg Hx     Social History   Socioeconomic History  . Marital status: Single    Spouse name: Not on file  . Number of children: Not on file  . Years of education: Not on file  . Highest education level: Not on file  Occupational History  . Not on file  Tobacco Use  . Smoking status: Never Smoker  . Smokeless tobacco: Never Used  Vaping Use  . Vaping Use: Never used  Substance and Sexual Activity  . Alcohol use: No  . Drug use: No  . Sexual activity: Not Currently    Birth control/protection: Post-menopausal  Other Topics Concern  . Not on file  Social History Narrative   Lives in [place], [place]. 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   Social Determinants of Health   Financial Resource Strain: Not on file   Food Insecurity: Not on file  Transportation Needs: Not on file  Physical Activity: Not on file  Stress: Not on file  Social Connections: Not on file  Intimate Partner Violence: Not on file      ROS:  General: Negative for anorexia, weight loss, fever, chills, fatigue, weakness. Eyes: Negative for vision changes.  ENT: Negative for hoarseness, difficulty swallowing , nasal congestion. CV: Negative for chest pain, angina, palpitations, dyspnea on exertion, peripheral edema.  Respiratory: Negative for dyspnea at rest, dyspnea on exertion, cough, sputum, wheezing.  GI: See  history of present illness. GU:  Negative for dysuria, hematuria, urinary incontinence, urinary frequency, nocturnal urination.  MS: Chronic knee pain from arthritis.  No low back pain.  Derm: Negative for rash or itching.  Neuro: Negative for weakness, abnormal sensation, seizure, frequent headaches, memory loss, confusion.  Psych: Negative for anxiety, depression, suicidal ideation, hallucinations.  Endo: Negative for unusual weight change.  Heme: Negative for bruising or bleeding. Allergy: Negative for rash or hives.    Physical Examination:  BP 139/86   Pulse 86   Temp (!) 97.1 F (36.2 C) (Temporal)   Ht 5\' 4"  (1.626 m)   Wt 272 lb 12.8 oz (123.7 kg)   BMI 46.83 kg/m    General: Well-nourished, well-developed in no acute distress.  Head: Normocephalic, atraumatic.   Eyes: Conjunctiva pink, no icterus. Mouth: masked Neck: Supple without thyromegaly, masses, or lymphadenopathy.  Lungs: Clear to auscultation bilaterally.  Heart: Regular rate and rhythm, no murmurs rubs or gallops.  Abdomen: Bowel sounds are normal, nontender, nondistended, no hepatosplenomegaly or masses, no abdominal bruits or    hernia , no rebound or guarding.  Limited due to body habitus Rectal: not performed Extremities: No lower extremity edema. No clubbing or deformities.  Neuro: Alert and oriented x 4 , grossly normal  neurologically.  Skin: Warm and dry, no rash or jaundice.   Psych: Alert and cooperative, normal mood and affect.  Assessment/plan:  Pleasant 70 year old female with history of advanced adenomatous colon polyps as outlined above, presenting for 2-year surveillance colonoscopy.  Last colonoscopy completed with general anesthesia with intubation in order to control respirations as the approach on the colon polyp in April 2019 was difficult as respirations interfered significantly.  Last colonoscopy much better with general anesthesia with intubation.  Dr. Gala Romney recommend repeat colonoscopies in similar manner. ASA III.  Colonoscopy in the near future with general anesthesia/intubation.  I have discussed the risks, alternatives, benefits with regards to but not limited to the risk of reaction to medication, bleeding, infection, perforation and the patient is agreeable to proceed. Written consent to be obtained.

## 2020-05-27 DIAGNOSIS — E785 Hyperlipidemia, unspecified: Secondary | ICD-10-CM | POA: Diagnosis not present

## 2020-05-27 DIAGNOSIS — D500 Iron deficiency anemia secondary to blood loss (chronic): Secondary | ICD-10-CM | POA: Diagnosis not present

## 2020-05-27 DIAGNOSIS — M170 Bilateral primary osteoarthritis of knee: Secondary | ICD-10-CM | POA: Diagnosis not present

## 2020-05-27 DIAGNOSIS — I25118 Atherosclerotic heart disease of native coronary artery with other forms of angina pectoris: Secondary | ICD-10-CM | POA: Diagnosis not present

## 2020-05-27 DIAGNOSIS — I10 Essential (primary) hypertension: Secondary | ICD-10-CM | POA: Diagnosis not present

## 2020-05-27 DIAGNOSIS — I119 Hypertensive heart disease without heart failure: Secondary | ICD-10-CM | POA: Diagnosis not present

## 2020-05-27 DIAGNOSIS — E118 Type 2 diabetes mellitus with unspecified complications: Secondary | ICD-10-CM | POA: Diagnosis not present

## 2020-06-11 DIAGNOSIS — I350 Nonrheumatic aortic (valve) stenosis: Secondary | ICD-10-CM | POA: Diagnosis not present

## 2020-06-11 DIAGNOSIS — I10 Essential (primary) hypertension: Secondary | ICD-10-CM | POA: Diagnosis not present

## 2020-06-11 DIAGNOSIS — E118 Type 2 diabetes mellitus with unspecified complications: Secondary | ICD-10-CM | POA: Diagnosis not present

## 2020-06-11 DIAGNOSIS — E785 Hyperlipidemia, unspecified: Secondary | ICD-10-CM | POA: Diagnosis not present

## 2020-06-11 DIAGNOSIS — I25118 Atherosclerotic heart disease of native coronary artery with other forms of angina pectoris: Secondary | ICD-10-CM | POA: Diagnosis not present

## 2020-06-22 NOTE — Progress Notes (Signed)
Cardiology Office Note    Date:  06/29/2020   ID:  Yui, Mulvaney 1950-11-16, MRN [id number]   PCP:  Caren Macadam, [place]  Cardiologist:  Kate Sable, MD (Inactive)  Advanced Practice Provider:  No care team member to display Electrophysiologist:  None   [id number]}   No chief complaint on file.   History of Present Illness:  Ann Roberson is a 70 y.o. female with history of presumed CAD abnormal nuclear study has never been cath because she was symptomatically stable, hypertension, aortic stenosis mild on echo 2019, HLD, IDDM.  Patient comes in for f/u. Has chronic dyspnea on exertion that's unchanged.Feet swollen past couple of days. Eat's frozen dinners. Takes HCTZ every other day and didn't have it yesterday.Denies chest pain, dizziness or presyncope. No regular exercise because of arthritis.  Echo 06/24/20 LVEF 60-65% mild -mod AS means gradient 18.5 mmHg up from 13 mmHg 2013, possible left pleural effusion..    Past Medical History:  Diagnosis Date  . Anemia   . Arthritis   . Diabetes mellitus   . GERD (gastroesophageal reflux disease)   . Heart murmur   . Hyperlipidemia   . Hypertension   . Neuropathy   . PONV (postoperative nausea and vomiting)   . Sarcoidosis     Past Surgical History:  Procedure Laterality Date  . BIOPSY  02/08/2018   Procedure: BIOPSY;  Surgeon: Daneil Dolin, MD;  Location: [facility];  Service: Endoscopy;;  . CESAREAN SECTION    . CHOLECYSTECTOMY    . COLONOSCOPY WITH PROPOFOL N/A 07/06/2017   Dr. Gala Romney: Diverticulosis.five 8 to 16 mm polyps in the ascending colon and in the cecum, removed with hot snare.  Largest polyp could not be removed completely, status post inking.  Pathology revealed cecal tubular adenoma, a sending colon polyps with multiple fragments of tubular adenomas, tubulovillous adenoma, no high-grade dysplasia or malignancy.  Tubular adenomas removed from the rectum  as   . COLONOSCOPY WITH PROPOFOL N/A 02/08/2018   12 mm tubular adenoma removed from the ileocecal valve, hyperplastic sigmoid colon polyp removed.  Possible residual polypoid tissue at the ascending colon segment previously tattooed, biopsied (tubular adenoma) and ablated with APC.  Recommend 2-year surveillance study.  [name] MASTECTOMY, PARTIAL Left    benign  . POLYPECTOMY  07/06/2017   Procedure: POLYPECTOMY;  Surgeon: Daneil Dolin, MD;  Location: [facility];  Service: Endoscopy;;  cecal polyp cs, ascending colon polyp hs  . POLYPECTOMY  02/08/2018   Procedure: POLYPECTOMY;  Surgeon: Daneil Dolin, MD;  Location: [facility];  Service: Endoscopy;;  . TUBAL LIGATION      Current Medications: Current Meds  Medication Sig  . acetaminophen (TYLENOL) 650 MG CR tablet Take 1,300 mg by mouth 2 (two) times daily.  [name] amLODipine (NORVASC) 10 MG tablet Take 10 mg by mouth in the morning.  [name] aspirin EC 81 MG tablet Take 81 mg by mouth in the morning. Swallow whole.  [name] atorvastatin (LIPITOR) 20 MG tablet Take 1 tablet by mouth daily.  . B-D UF III MINI PEN NEEDLES 31G X 5 MM MISC USE WITH LANTUS PEN  . Cyanocobalamin (VITAMIN B-12) 2500 MCG SUBL Place 2,500 mcg under the tongue in the morning.  . diclofenac Sodium (VOLTAREN) 1 % GEL Apply 1 application topically 2 (two) times daily as needed.  . DULoxetine (CYMBALTA) 60 MG capsule Take 60 mg by mouth at bedtime.  [name]  gabapentin (NEURONTIN) 300 MG capsule TAKE 2 CAPSULES BY MOUTH THREE TIMES DAILY (Patient taking differently: Take 600 mg by mouth 3 (three) times daily.)  . hydrochlorothiazide (HYDRODIURIL) 25 MG tablet Take 25 mg by mouth every other day. In the morning  . LANTUS SOLOSTAR 100 UNIT/ML Solostar Pen Inject 50 Units into the skin daily at 10 pm.  . losartan (COZAAR) 100 MG tablet TAKE 1 TABLET BY MOUTH ONCE DAILY (Patient taking differently: Take 100 mg by mouth in the morning.)  . metFORMIN (GLUCOPHAGE) 1000 MG tablet TAKE 1 TABLET  BY MOUTH TWICE DAILY (Patient taking differently: Take 1,000 mg by mouth in the morning and at bedtime.)  . metoprolol tartrate (LOPRESSOR) 25 MG tablet TAKE 1 TABLET BY MOUTH TWICE DAILY (Patient taking differently: Take 25 mg by mouth 2 (two) times daily.)  . nitroGLYCERIN (NITROSTAT) 0.4 MG SL tablet Place 1 tablet (0.4 mg total) under the tongue every 5 (five) minutes as needed. (Patient taking differently: Place 0.4 mg under the tongue every 5 (five) minutes x 3 doses as needed for chest pain.)  . OZEMPIC, 0.25 OR 0.5 MG/DOSE, 2 MG/1.5ML SOPN Inject 0.5 mg into the skin every Sunday.  . polyethylene glycol-electrolytes (NULYTELY) 420 g solution As directed  . potassium chloride SA (K-DUR,KLOR-CON) 20 MEQ tablet Take 1 tablet (20 mEq total) by mouth daily. (Patient taking differently: Take 20 mEq by mouth every other day. In the morning)     Allergies:   Erythromycin   Social History   Socioeconomic History  . Marital status: Single    Spouse name: Not on file  . Number of children: Not on file  . Years of education: Not on file  . Highest education level: Not on file  Occupational History  . Not on file  Tobacco Use  . Smoking status: Never Smoker  . Smokeless tobacco: Never Used  Vaping Use  . Vaping Use: Never used  Substance and Sexual Activity  . Alcohol use: No  . Drug use: No  . Sexual activity: Not Currently    Birth control/protection: Post-menopausal  Other Topics Concern  . Not on file  Social History Narrative   Lives in [place], [place]. 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   Social Determinants of Health   Financial Resource Strain: Not on file  Food Insecurity: Not on file  Transportation Needs: Not on file  Physical Activity: Not on file  Stress: Not on file  Social Connections: Not on file     Family History:  The patient's family history includes Asthma in her brother; Cancer in her brother, father, and mother;  Diabetes in her brother.   ROS:   Please see the history of present illness.    ROS All other systems reviewed and are negative.   PHYSICAL EXAM:   VS:  BP 126/68   Pulse 96   Ht 5\' 4"  (1.626 m)   Wt 274 lb (124.3 kg)   SpO2 96%   BMI 47.03 kg/m   Physical Exam  GEN: Obese, in no acute distress  Neck: no JVD, carotid bruits, or masses Cardiac:RRR; 2/6 systolic murmur the left sternal border and apex Respiratory: Decreased breath sounds at the left base otherwise clear GI: soft, nontender, nondistended, + BS Ext: without cyanosis, clubbing, or edema, Good distal pulses bilaterally Neuro:  Alert and Oriented x 3 Psych: euthymic mood, full affect  Wt Readings from Last 3 Encounters:  06/29/20 274 lb (124.3 kg)  05/18/20 272 lb 12.8 oz (123.7 kg)  04/22/20 272 lb (123.4 kg)      Studies/Labs Reviewed:   EKG:  EKG is not ordered today. Recent Labs: No results found for requested labs within last 8760 hours.   Lipid Panel    Component Value Date/Time   CHOL 144 12/08/2017 0711   TRIG 88 10/23/2018 1251   HDL 42 (L) 12/08/2017 0711   CHOLHDL 3.4 12/08/2017 0711   LDLCALC 69 12/08/2017 0711    Additional studies/ records that were reviewed today include:  2D echo 06/24/2020 IMPRESSIONS     1. Left ventricular ejection fraction, by estimation, is 60 to 65%. The  left ventricle has normal function. The left ventricle has no regional  wall motion abnormalities.   2. Right ventricular systolic function is normal. The right ventricular  size is normal. Tricuspid regurgitation signal is inadequate for assessing  PA pressure.   3. Fluid collection posterior to left venticle is most consistent with  pleural effusion.   4. The mitral valve is grossly normal. Trivial mitral valve  regurgitation.   5. The aortic valve has an indeterminant number of cusps. There is  moderate calcification of the aortic valve. Aortic valve regurgitation is  not visualized. Mild to moderate  aortic valve stenosis. Aortic valve mean  gradient measures 18.5 mmHg. Aortic  valve Vmax measures 2.94 m/s. Dimentionless index 0.41.   6. The inferior vena cava is normal in size with greater than 50%  respiratory variability, suggesting right atrial pressure of 3 mmHg.    Echo 04/12/17 Study Conclusions   - Left ventricle: The cavity size was normal. Wall thickness was    increased in a pattern of mild LVH. Systolic function was normal.    The estimated ejection fraction was in the range of 60% to 65%.    Wall motion was normal; there were no regional wall motion    abnormalities. Doppler parameters are consistent with abnormal    left ventricular relaxation (grade 1 diastolic dysfunction).    Doppler parameters are consistent with high ventricular filling    pressure.  - Aortic valve: Trileaflet; mildly thickened, mildly calcified    leaflets. There was mild stenosis. Peak velocity (S): 248 cm/s.    Mean gradient (S): 13 mm Hg.  NUC study   No diagnostic ST segment changes to indicate ischemia.  Small, mild intensity, mid to apical anteroseptal defect that is fixed, more prominent on rest imaging and suggestive of soft tissue attenuation.  Medium, mild intensity, inferoseptal and inferolateral defect that partially reversible in the mid to apical distribution and fixed at the base suggestive of scar with mild to moderate peri-infarct ischemia.  This is an intermediate risk study.  Nuclear stress EF: 58%.        Risk Assessment/Calculations:         ASSESSMENT:    1. Abnormal nuclear stress test   2. Nonrheumatic aortic valve stenosis   3. Essential hypertension   4. Hyperlipidemia, unspecified hyperlipidemia type   5. Pleural effusion   6. Type 2 diabetes mellitus with other specified complication, without long-term current use of insulin (HCC)      PLAN:  In order of problems listed above:  Presumed CAD with abnormal nuclear stress test 2019 treated  medically because asymptomatic-no chest pain.  Very inactive because of arthritis.  Chronic dyspnea on exertion unchanged.  Aortic stenosis mild mean gradient 13 mmHg on echo 2019 now mild to  moderate aortic stenosis on echo 06/24/2020, mean gradient 18.5 mmHg.  Chronic dyspnea on exertion unchanged.  We will continue with yearly surveillance.  Hypertension blood pressure well controlled  HLD LDL 27 on 06/11/2020  IDDM A1c 8.0 06/11/2020.  Managed by PCP.  Left pleural effusion noted on echo.  She also has some lower extremity edema. she is getting extra salt in her diet.  She takes HCTZ 25 mg every other day.  Legs are usually swell in the day after she does not take it.  2 g sodium diet.  Can take an extra HCTZ as needed.  Last creatinine 1.06 on 06/11/2020.  Check chest x-ray.  Shared Decision Making/Informed Consent        Medication Adjustments/Labs and Tests Ordered: Current medicines are reviewed at length with the patient today.  Concerns regarding medicines are outlined above.  Medication changes, Labs and Tests ordered today are listed in the Patient Instructions below. Patient Instructions   Medication Instructions:  Your physician recommends that you continue on your current medications as directed. Please refer to the Current Medication list given to you today.  *If you need a refill on your cardiac medications before your next appointment, please call your pharmacy*   Lab Work: NONE   If you have labs (blood work) drawn today and your tests are completely normal, you will receive your results only by: [name] MyChart Message (if you have MyChart) OR . A paper copy in the mail If you have any lab test that is abnormal or we need to change your treatment, we will call you to review the results.   Testing/Procedures: A chest x-ray takes a picture of the organs and structures inside the chest, including the heart, lungs, and blood vessels. This test can show several things,  including, whether the heart is enlarges; whether fluid is building up in the lungs; and whether pacemaker / defibrillator leads are still in place.   Follow-Up: At [facility], you and your health needs are our priority.  As part of our continuing mission to provide you with exceptional heart care, we have created designated Provider Care Teams.  These Care Teams include your primary Cardiologist (physician) and Advanced Practice Providers (APPs -  Physician Assistants and Nurse Practitioners) who all work together to provide you with the care you need, when you need it.  We recommend signing up for the patient portal called "MyChart".  Sign up information is provided on this After Visit Summary.  MyChart is used to connect with patients for Virtual Visits (Telemedicine).  Patients are able to view lab/test results, encounter notes, upcoming appointments, etc.  Non-urgent messages can be sent to your provider as well.   To learn more about what you can do with MyChart, go to [email or web address].    Your next appointment:   6 month(s)  The format for your next appointment:   In Person  Provider:   With MD    Other Instructions Thank you for choosing [facility]!    Two Gram Sodium Diet 2000 mg  What is Sodium? Sodium is a mineral found naturally in many foods. The most significant source of sodium in the diet is table salt, which is about 40% sodium.  Processed, convenience, and preserved foods also contain a large amount of sodium.  The body needs only 500 mg of sodium daily to function,  A normal diet provides more than enough sodium even if you do not use salt.  Why Limit Sodium?  A build up of sodium in the body can cause thirst, increased blood pressure, shortness of breath, and water retention.  Decreasing sodium in the diet can reduce edema and risk of heart attack or stroke associated with high blood pressure.  Keep in mind that there are many other factors involved  in these health problems.  Heredity, obesity, lack of exercise, cigarette smoking, stress and what you eat all play a role.  General Guidelines:  Do not add salt at the table or in cooking.  One teaspoon of salt contains over 2 grams of sodium.  Read food labels  Avoid processed and convenience foods  Ask your dietitian before eating any foods not dicussed in the menu planning guidelines  Consult your physician if you wish to use a salt substitute or a sodium containing medication such as antacids.  Limit milk and milk products to 16 oz (2 cups) per day.  Shopping Hints:  READ LABELS!! "Dietetic" does not necessarily mean low sodium.  Salt and other sodium ingredients are often added to foods during processing.   Menu Planning Guidelines Food Group Choose More Often Avoid  Beverages (see also the milk group All fruit juices, low-sodium, salt-free vegetables juices, low-sodium carbonated beverages Regular vegetable or tomato juices, commercially softened water used for drinking or cooking  Breads and Cereals Enriched white, wheat, rye and pumpernickel bread, hard rolls and dinner rolls; muffins, cornbread and waffles; most dry cereals, cooked cereal without added salt; unsalted crackers and breadsticks; low sodium or homemade bread crumbs Bread, rolls and crackers with salted tops; quick breads; instant hot cereals; pancakes; commercial bread stuffing; self-rising flower and biscuit mixes; regular bread crumbs or cracker crumbs  Desserts and Sweets Desserts and sweets mad with mild should be within allowance Instant pudding mixes and cake mixes  Fats Butter or margarine; vegetable oils; unsalted salad dressings, regular salad dressings limited to 1 Tbs; light, sour and heavy cream Regular salad dressings containing bacon fat, bacon bits, and salt pork; snack dips made with instant soup mixes or processed cheese; salted nuts  Fruits Most fresh, frozen and canned fruits Fruits processed with  salt or sodium-containing ingredient (some dried fruits are processed with sodium sulfites        Vegetables Fresh, frozen vegetables and low- sodium canned vegetables Regular canned vegetables, sauerkraut, pickled vegetables, and others prepared in brine; frozen vegetables in sauces; vegetables seasoned with ham, bacon or salt pork  Condiments, Sauces, Miscellaneous  Salt substitute with physician's approval; pepper, herbs, spices; vinegar, lemon or lime juice; hot pepper sauce; garlic powder, onion powder, low sodium soy sauce (1 Tbs.); low sodium condiments (ketchup, chili sauce, mustard) in limited amounts (1 tsp.) fresh ground horseradish; unsalted tortilla chips, pretzels, potato chips, popcorn, salsa (1/4 cup) Any seasoning made with salt including garlic salt, celery salt, onion salt, and seasoned salt; sea salt, rock salt, kosher salt; meat tenderizers; monosodium glutamate; mustard, regular soy sauce, barbecue, sauce, chili sauce, teriyaki sauce, steak sauce, Worcestershire sauce, and most flavored vinegars; canned gravy and mixes; regular condiments; salted snack foods, olives, picles, relish, horseradish sauce, catsup   Food preparation: Try these seasonings Meats:    Pork Sage, onion Serve with applesauce  Chicken Poultry seasoning, thyme, parsley Serve with cranberry sauce  Lamb Curry powder, rosemary, garlic, thyme Serve with mint sauce or jelly  Veal Marjoram, basil Serve with current jelly, cranberry sauce  Beef Pepper, bay leaf Serve with dry mustard, unsalted chive butter  Fish [place] leaf, dill Serve with  unsalted lemon butter, unsalted parsley butter  Vegetables:    Asparagus Lemon juice   Broccoli Lemon juice   Carrots Mustard dressing parsley, mint, nutmeg, glazed with unsalted butter and sugar   Green beans Marjoram, lemon juice, nutmeg,dill seed   Tomatoes Basil, marjoram, onion   Spice /blend for [employer]" 4 tsp ground thyme 1 tsp ground sage 3 tsp ground  rosemary 4 tsp ground marjoram   Test your knowledge 1. A product that says "Salt Free" may still contain sodium. True or False 2. Garlic Powder and Hot Pepper Sauce an be used as alternative seasonings.True or False 3. Processed foods have more sodium than fresh foods.  True or False 4. Canned Vegetables have less sodium than froze True or False  WAYS TO DECREASE YOUR SODIUM INTAKE 1. Avoid the use of added salt in cooking and at the table.  Table salt (and other prepared seasonings which contain salt) is probably one of the greatest sources of sodium in the diet.  Unsalted foods can gain flavor from the sweet, sour, and butter taste sensations of herbs and spices.  Instead of using salt for seasoning, try the following seasonings with the foods listed.  Remember: how you use them to enhance natural food flavors is limited only by your creativity... Allspice-Meat, fish, eggs, fruit, peas, red and yellow vegetables Almond Extract-Fruit baked goods Anise Seed-Sweet breads, fruit, carrots, beets, cottage cheese, cookies (tastes like licorice) Basil-Meat, fish, eggs, vegetables, rice, vegetables salads, soups, sauces Bay Leaf-Meat, fish, stews, poultry Burnet-Salad, vegetables (cucumber-like flavor) Caraway Seed-Bread, cookies, cottage cheese, meat, vegetables, cheese, rice Cardamon-Baked goods, fruit, soups Celery Powder or seed-Salads, salad dressings, sauces, meatloaf, soup, bread.Do not use  celery salt Chervil-Meats, salads, fish, eggs, vegetables, cottage cheese (parsley-like flavor) Chili Power-Meatloaf, chicken cheese, corn, eggplant, egg dishes Chives-Salads cottage cheese, egg dishes, soups, vegetables, sauces Cilantro-Salsa, casseroles Cinnamon-Baked goods, fruit, pork, lamb, chicken, carrots Cloves-Fruit, baked goods, fish, pot roast, green beans, beets, carrots Coriander-Pastry, cookies, meat, salads, cheese (lemon-orange flavor) Cumin-Meatloaf, fish,cheese, eggs, cabbage,fruit  pie (caraway flavor) [employer], fruit, eggs, fish, poultry, cottage cheese, vegetables Dill Seed-Meat, cottage cheese, poultry, vegetables, fish, salads, bread Fennel Seed-Bread, cookies, apples, pork, eggs, fish, beets, cabbage, cheese, Licorice-like flavor Garlic-(buds or powder) Salads, meat, poultry, fish, bread, butter, vegetables, potatoes.Do not  use garlic salt Ginger-Fruit, vegetables, baked goods, meat, fish, poultry Horseradish Root-Meet, vegetables, butter Lemon Juice or Extract-Vegetables, fruit, tea, baked goods, fish salads Mace-Baked goods fruit, vegetables, fish, poultry (taste like nutmeg) Maple Extract-Syrups Marjoram-Meat, chicken, fish, vegetables, breads, green salads (taste like Sage) Mint-Tea, lamb, sherbet, vegetables, desserts, carrots, cabbage Mustard, Dry or Seed-Cheese, eggs, meats, vegetables, poultry Nutmeg-Baked goods, fruit, chicken, eggs, vegetables, desserts Onion Powder-Meat, fish, poultry, vegetables, cheese, eggs, bread, rice salads (Do not use   Onion salt) Orange Extract-Desserts, baked goods Oregano-Pasta, eggs, cheese, onions, pork, lamb, fish, chicken, vegetables, green salads Paprika-Meat, fish, poultry, eggs, cheese, vegetables Parsley Flakes-Butter, vegetables, meat fish, poultry, eggs, bread, salads (certain forms may   Contain sodium Pepper-Meat fish, poultry, vegetables, eggs Peppermint Extract-Desserts, baked goods Poppy Seed-Eggs, bread, cheese, fruit dressings, baked goods, noodles, vegetables, cottage  [employer], poultry, meat, fish, cauliflower, turnips,eggs bread Saffron-Rice, bread, veal, chicken, fish, eggs Sage-Meat, fish, poultry, onions, eggplant, tomateos, pork, stews Savory-Eggs, salads, poultry, meat, rice, vegetables, soups, pork Tarragon-Meat, poultry, fish, eggs, butter, vegetables (licorice-like flavor)  Thyme-Meat, poultry, fish, eggs, vegetables, (clover-like flavor),  sauces, soups Tumeric-Salads, butter, eggs, fish, rice, vegetables (saffron-like flavor) Vanilla Extract-Baked goods, candy  Vinegar-Salads, vegetables, meat marinades Walnut Extract-baked goods, candy  2. Choose your Foods Wisely   The following is a list of foods to avoid which are high in sodium:  Meats-Avoid all smoked, canned, salt cured, dried and kosher meat and fish as well as Anchovies   [employer] meats:Bologna, Liverwurst, Pastrami Canned meat or fish  Marinated herring Caviar    Pepperoni Corned Beef   Pizza Dried chipped beef  Salami Frozen breaded fish or meat Salt pork Frankfurters or hot dogs  Sardines Gefilte fish   Sausage Ham (boiled ham, Proscuitto Smoked butt    spiced ham)   Spam      TV Dinners Vegetables Canned vegetables (Regular) Relish Canned mushrooms  Sauerkraut Olives    Tomato juice Pickles  Bakery and Dessert Products Canned puddings  Cream pies Cheesecake   Decorated cakes Cookies  Beverages/Juices Tomato juice, regular  Gatorade   V-8 vegetable juice, regular  Breads and Cereals Biscuit mixes   Salted potato chips, corn chips, pretzels Bread stuffing mixes  Salted crackers and rolls Pancake and waffle mixes Self-rising flour  Seasonings Accent    Meat sauces Barbecue sauce  Meat tenderizer Catsup    Monosodium glutamate (MSG) Celery salt   Onion salt Chili sauce   Prepared mustard Garlic salt   Salt, seasoned salt, sea salt Gravy mixes   Soy sauce Horseradish   Steak sauce Ketchup   Tartar sauce Lite salt    Teriyaki sauce Marinade mixes   Worcestershire sauce  Others Baking powder   Cocoa and cocoa mixes Baking soda   Commercial casserole mixes Candy-caramels, chocolate  Dehydrated soups    Bars, fudge,nougats  Instant rice and pasta mixes Canned broth or soup  Maraschino cherries Cheese, aged and processed cheese and cheese spreads  Learning Assessment Quiz  Indicated T (for True) or F (for False) for each  of the following statements:  1. _____ Fresh fruits and vegetables and unprocessed grains are generally low in sodium 2. _____ Water may contain a considerable amount of sodium, depending on the source 3. _____ You can always tell if a food is high in sodium by tasting it 4. _____ Certain laxatives my be high in sodium and should be avoided unless prescribed   by a physician or pharmacist 5. _____ Salt substitutes may be used freely by anyone on a sodium restricted diet 6. _____ Sodium is present in table salt, food additives and as a natural component of   most foods 7. _____ Table salt is approximately 90% sodium 8. _____ Limiting sodium intake may help prevent excess fluid accumulation in the body 9. _____ On a sodium-restricted diet, seasonings such as bouillon soy sauce, and    cooking wine should be used in place of table salt 10. _____ On an ingredient list, a product which lists monosodium glutamate as the first   ingredient is an appropriate food to include on a low sodium diet  Circle the best answer(s) to the following statements (Hint: there may be more than one correct answer)  11. On a low-sodium diet, some acceptable snack items are:    A. Olives  F. Bean dip   K. Grapefruit juice    B. Salted Pretzels G. Commercial Popcorn   L. Canned peaches    C. Carrot Sticks  H. Bouillon   M. Unsalted nuts   D. [place] fries  I. Peanut butter crackers N. Salami   E. Sweet pickles J. Tomato Juice  O. Pizza  12.  Seasonings that may be used freely on a reduced - sodium diet include   A. Lemon wedges F.Monosodium glutamate K. Celery seed    B.Soysauce   G. Pepper   L. Mustard powder   C. Sea salt  H. Cooking wine  M. Onion flakes   D. Vinegar  E. Prepared horseradish N. Salsa   E. Sage   J. Worcestershire sauce  O. [address]      Sumner Boast, PA-C  06/29/2020 11:56 AM    [facility] [place], [place], [place]  [address] Phone: [phone]; Fax: [phone]

## 2020-06-24 ENCOUNTER — Ambulatory Visit (HOSPITAL_COMMUNITY)
Admission: RE | Admit: 2020-06-24 | Discharge: 2020-06-24 | Disposition: A | Discharge status: Home / Self Care | Payer: Medicare Other | Source: Ambulatory Visit | Attending: Cardiology | Admitting: Cardiology

## 2020-06-24 ENCOUNTER — Other Ambulatory Visit: Payer: Self-pay

## 2020-06-24 DIAGNOSIS — I350 Nonrheumatic aortic (valve) stenosis: Secondary | ICD-10-CM

## 2020-06-24 LAB — ECHOCARDIOGRAM LIMITED
AR max vel: 0.78 cm2
AV Area VTI: 0.72 cm2
AV Area mean vel: 0.78 cm2
AV Mean grad: 18.5 mmHg
AV Peak grad: 34.5 mmHg
Ao pk vel: 2.94 m/s

## 2020-06-24 NOTE — Progress Notes (Signed)
*  PRELIMINARY RESULTS* Echocardiogram Limited 2-D Echocardiogram has been performed.  Ann Roberson 06/24/2020, 10:34 AM

## 2020-06-29 ENCOUNTER — Ambulatory Visit (INDEPENDENT_AMBULATORY_CARE_PROVIDER_SITE_OTHER): Payer: Medicare Other | Admitting: Physician Assistant

## 2020-06-29 ENCOUNTER — Other Ambulatory Visit: Payer: Self-pay

## 2020-06-29 ENCOUNTER — Ambulatory Visit (HOSPITAL_COMMUNITY)
Admission: RE | Admit: 2020-06-29 | Discharge: 2020-06-29 | Disposition: A | Discharge status: Home / Self Care | Payer: Medicare Other | Source: Ambulatory Visit | Attending: Physician Assistant | Admitting: Physician Assistant

## 2020-06-29 ENCOUNTER — Encounter: Payer: Self-pay | Admitting: Physician Assistant

## 2020-06-29 VITALS — BP 126/68 | HR 96 | Ht 64.0 in | Wt 274.0 lb

## 2020-06-29 DIAGNOSIS — I350 Nonrheumatic aortic (valve) stenosis: Secondary | ICD-10-CM

## 2020-06-29 DIAGNOSIS — E1169 Type 2 diabetes mellitus with other specified complication: Secondary | ICD-10-CM | POA: Diagnosis not present

## 2020-06-29 DIAGNOSIS — E785 Hyperlipidemia, unspecified: Secondary | ICD-10-CM | POA: Diagnosis not present

## 2020-06-29 DIAGNOSIS — J90 Pleural effusion, not elsewhere classified: Secondary | ICD-10-CM | POA: Insufficient documentation

## 2020-06-29 DIAGNOSIS — R9439 Abnormal result of other cardiovascular function study: Secondary | ICD-10-CM | POA: Diagnosis not present

## 2020-06-29 DIAGNOSIS — I10 Essential (primary) hypertension: Secondary | ICD-10-CM | POA: Diagnosis not present

## 2020-06-29 NOTE — Patient Instructions (Signed)
Medication Instructions:  Your physician recommends that you continue on your current medications as directed. Please refer to the Current Medication list given to you today.  *If you need a refill on your cardiac medications before your next appointment, please call your pharmacy*   Lab Work: NONE   If you have labs (blood work) drawn today and your tests are completely normal, you will receive your results only by: [name] MyChart Message (if you have MyChart) OR . A paper copy in the mail If you have any lab test that is abnormal or we need to change your treatment, we will call you to review the results.   Testing/Procedures: A chest x-ray takes a picture of the organs and structures inside the chest, including the heart, lungs, and blood vessels. This test can show several things, including, whether the heart is enlarges; whether fluid is building up in the lungs; and whether pacemaker / defibrillator leads are still in place.   Follow-Up: At [facility], you and your health needs are our priority.  As part of our continuing mission to provide you with exceptional heart care, we have created designated Provider Care Teams.  These Care Teams include your primary Cardiologist (physician) and Advanced Practice Providers (APPs -  Physician Assistants and Nurse Practitioners) who all work together to provide you with the care you need, when you need it.  We recommend signing up for the patient portal called "MyChart".  Sign up information is provided on this After Visit Summary.  MyChart is used to connect with patients for Virtual Visits (Telemedicine).  Patients are able to view lab/test results, encounter notes, upcoming appointments, etc.  Non-urgent messages can be sent to your provider as well.   To learn more about what you can do with MyChart, go to [email or web address].    Your next appointment:   6 month(s)  The format for your next appointment:   In Person  Provider:   With  MD    Other Instructions Thank you for choosing [facility]!    Two Gram Sodium Diet 2000 mg  What is Sodium? Sodium is a mineral found naturally in many foods. The most significant source of sodium in the diet is table salt, which is about 40% sodium.  Processed, convenience, and preserved foods also contain a large amount of sodium.  The body needs only 500 mg of sodium daily to function,  A normal diet provides more than enough sodium even if you do not use salt.  Why Limit Sodium? A build up of sodium in the body can cause thirst, increased blood pressure, shortness of breath, and water retention.  Decreasing sodium in the diet can reduce edema and risk of heart attack or stroke associated with high blood pressure.  Keep in mind that there are many other factors involved in these health problems.  Heredity, obesity, lack of exercise, cigarette smoking, stress and what you eat all play a role.  General Guidelines:  Do not add salt at the table or in cooking.  One teaspoon of salt contains over 2 grams of sodium.  Read food labels  Avoid processed and convenience foods  Ask your dietitian before eating any foods not dicussed in the menu planning guidelines  Consult your physician if you wish to use a salt substitute or a sodium containing medication such as antacids.  Limit milk and milk products to 16 oz (2 cups) per day.  Shopping Hints:  READ LABELS!! "Dietetic" does not necessarily  mean low sodium.  Salt and other sodium ingredients are often added to foods during processing.   Menu Planning Guidelines Food Group Choose More Often Avoid  Beverages (see also the milk group All fruit juices, low-sodium, salt-free vegetables juices, low-sodium carbonated beverages Regular vegetable or tomato juices, commercially softened water used for drinking or cooking  Breads and Cereals Enriched white, wheat, rye and pumpernickel bread, hard rolls and dinner rolls; muffins,  cornbread and waffles; most dry cereals, cooked cereal without added salt; unsalted crackers and breadsticks; low sodium or homemade bread crumbs Bread, rolls and crackers with salted tops; quick breads; instant hot cereals; pancakes; commercial bread stuffing; self-rising flower and biscuit mixes; regular bread crumbs or cracker crumbs  Desserts and Sweets Desserts and sweets mad with mild should be within allowance Instant pudding mixes and cake mixes  Fats Butter or margarine; vegetable oils; unsalted salad dressings, regular salad dressings limited to 1 Tbs; light, sour and heavy cream Regular salad dressings containing bacon fat, bacon bits, and salt pork; snack dips made with instant soup mixes or processed cheese; salted nuts  Fruits Most fresh, frozen and canned fruits Fruits processed with salt or sodium-containing ingredient (some dried fruits are processed with sodium sulfites        Vegetables Fresh, frozen vegetables and low- sodium canned vegetables Regular canned vegetables, sauerkraut, pickled vegetables, and others prepared in brine; frozen vegetables in sauces; vegetables seasoned with ham, bacon or salt pork  Condiments, Sauces, Miscellaneous  Salt substitute with physician's approval; pepper, herbs, spices; vinegar, lemon or lime juice; hot pepper sauce; garlic powder, onion powder, low sodium soy sauce (1 Tbs.); low sodium condiments (ketchup, chili sauce, mustard) in limited amounts (1 tsp.) fresh ground horseradish; unsalted tortilla chips, pretzels, potato chips, popcorn, salsa (1/4 cup) Any seasoning made with salt including garlic salt, celery salt, onion salt, and seasoned salt; sea salt, rock salt, kosher salt; meat tenderizers; monosodium glutamate; mustard, regular soy sauce, barbecue, sauce, chili sauce, teriyaki sauce, steak sauce, Worcestershire sauce, and most flavored vinegars; canned gravy and mixes; regular condiments; salted snack foods, olives, picles, relish,  horseradish sauce, catsup   Food preparation: Try these seasonings Meats:    Pork Sage, onion Serve with applesauce  Chicken Poultry seasoning, thyme, parsley Serve with cranberry sauce  Lamb Curry powder, rosemary, garlic, thyme Serve with mint sauce or jelly  Veal Marjoram, basil Serve with current jelly, cranberry sauce  Beef Pepper, bay leaf Serve with dry mustard, unsalted chive butter  Fish Bay leaf, dill Serve with unsalted lemon butter, unsalted parsley butter  Vegetables:    Asparagus Lemon juice   Broccoli Lemon juice   Carrots Mustard dressing parsley, mint, nutmeg, glazed with unsalted butter and sugar   Green beans Marjoram, lemon juice, nutmeg,dill seed   Tomatoes Basil, marjoram, onion   Spice /blend for [employer]" 4 tsp ground thyme 1 tsp ground sage 3 tsp ground rosemary 4 tsp ground marjoram   Test your knowledge 1. A product that says "Salt Free" may still contain sodium. True or False 2. Garlic Powder and Hot Pepper Sauce an be used as alternative seasonings.True or False 3. Processed foods have more sodium than fresh foods.  True or False 4. Canned Vegetables have less sodium than froze True or False  WAYS TO DECREASE YOUR SODIUM INTAKE 1. Avoid the use of added salt in cooking and at the table.  Table salt (and other prepared seasonings which contain salt) is probably one of the greatest sources  of sodium in the diet.  Unsalted foods can gain flavor from the sweet, sour, and butter taste sensations of herbs and spices.  Instead of using salt for seasoning, try the following seasonings with the foods listed.  Remember: how you use them to enhance natural food flavors is limited only by your creativity... Allspice-Meat, fish, eggs, fruit, peas, red and yellow vegetables Almond Extract-Fruit baked goods Anise Seed-Sweet breads, fruit, carrots, beets, cottage cheese, cookies (tastes like licorice) Basil-Meat, fish, eggs, vegetables, rice, vegetables salads, soups,  sauces Bay Leaf-Meat, fish, stews, poultry Burnet-Salad, vegetables (cucumber-like flavor) Caraway Seed-Bread, cookies, cottage cheese, meat, vegetables, cheese, rice Cardamon-Baked goods, fruit, soups Celery Powder or seed-Salads, salad dressings, sauces, meatloaf, soup, bread.Do not use  celery salt Chervil-Meats, salads, fish, eggs, vegetables, cottage cheese (parsley-like flavor) Chili Power-Meatloaf, chicken cheese, corn, eggplant, egg dishes Chives-Salads cottage cheese, egg dishes, soups, vegetables, sauces Cilantro-Salsa, casseroles Cinnamon-Baked goods, fruit, pork, lamb, chicken, carrots Cloves-Fruit, baked goods, fish, pot roast, green beans, beets, carrots Coriander-Pastry, cookies, meat, salads, cheese (lemon-orange flavor) Cumin-Meatloaf, fish,cheese, eggs, cabbage,fruit pie (caraway flavor) [employer], fruit, eggs, fish, poultry, cottage cheese, vegetables Dill Seed-Meat, cottage cheese, poultry, vegetables, fish, salads, bread Fennel Seed-Bread, cookies, apples, pork, eggs, fish, beets, cabbage, cheese, Licorice-like flavor Garlic-(buds or powder) Salads, meat, poultry, fish, bread, butter, vegetables, potatoes.Do not  use garlic salt Ginger-Fruit, vegetables, baked goods, meat, fish, poultry Horseradish Root-Meet, vegetables, butter Lemon Juice or Extract-Vegetables, fruit, tea, baked goods, fish salads Mace-Baked goods fruit, vegetables, fish, poultry (taste like nutmeg) Maple Extract-Syrups Marjoram-Meat, chicken, fish, vegetables, breads, green salads (taste like Sage) Mint-Tea, lamb, sherbet, vegetables, desserts, carrots, cabbage Mustard, Dry or Seed-Cheese, eggs, meats, vegetables, poultry Nutmeg-Baked goods, fruit, chicken, eggs, vegetables, desserts Onion Powder-Meat, fish, poultry, vegetables, cheese, eggs, bread, rice salads (Do not use   Onion salt) Orange Extract-Desserts, baked goods Oregano-Pasta, eggs, cheese, onions, pork, lamb, fish, chicken,  vegetables, green salads Paprika-Meat, fish, poultry, eggs, cheese, vegetables Parsley Flakes-Butter, vegetables, meat fish, poultry, eggs, bread, salads (certain forms may   Contain sodium Pepper-Meat fish, poultry, vegetables, eggs Peppermint Extract-Desserts, baked goods Poppy Seed-Eggs, bread, cheese, fruit dressings, baked goods, noodles, vegetables, cottage  [employer], poultry, meat, fish, cauliflower, turnips,eggs bread Saffron-Rice, bread, veal, chicken, fish, eggs Sage-Meat, fish, poultry, onions, eggplant, tomateos, pork, stews Savory-Eggs, salads, poultry, meat, rice, vegetables, soups, pork Tarragon-Meat, poultry, fish, eggs, butter, vegetables (licorice-like flavor)  Thyme-Meat, poultry, fish, eggs, vegetables, (clover-like flavor), sauces, soups Tumeric-Salads, butter, eggs, fish, rice, vegetables (saffron-like flavor) Vanilla Extract-Baked goods, candy Vinegar-Salads, vegetables, meat marinades Walnut Extract-baked goods, candy  2. Choose your Foods Wisely   The following is a list of foods to avoid which are high in sodium:  Meats-Avoid all smoked, canned, salt cured, dried and kosher meat and fish as well as Anchovies   [employer] meats:Bologna, Liverwurst, Pastrami Canned meat or fish  Marinated herring Caviar    Pepperoni Corned Beef   Pizza Dried chipped beef  Salami Frozen breaded fish or meat Salt pork Frankfurters or hot dogs  Sardines Gefilte fish   Sausage Ham (boiled ham, Proscuitto Smoked butt    spiced ham)   Spam      TV Dinners Vegetables Canned vegetables (Regular) Relish Canned mushrooms  Sauerkraut Olives    Tomato juice Pickles  Bakery and Dessert Products Canned puddings  Cream pies Cheesecake   Decorated cakes Cookies  Beverages/Juices Tomato juice, regular  Gatorade   V-8 vegetable juice, regular  Breads and  Cereals Biscuit mixes   Salted potato chips, corn chips,  pretzels Bread stuffing mixes  Salted crackers and rolls Pancake and waffle mixes Self-rising flour  Seasonings Accent    Meat sauces Barbecue sauce  Meat tenderizer Catsup    Monosodium glutamate (MSG) Celery salt   Onion salt Chili sauce   Prepared mustard Garlic salt   Salt, seasoned salt, sea salt Gravy mixes   Soy sauce Horseradish   Steak sauce Ketchup   Tartar sauce Lite salt    Teriyaki sauce Marinade mixes   Worcestershire sauce  Others Baking powder   Cocoa and cocoa mixes Baking soda   Commercial casserole mixes Candy-caramels, chocolate  Dehydrated soups    Bars, fudge,nougats  Instant rice and pasta mixes Canned broth or soup  Maraschino cherries Cheese, aged and processed cheese and cheese spreads  Learning Assessment Quiz  Indicated T (for True) or F (for False) for each of the following statements:  1. _____ Fresh fruits and vegetables and unprocessed grains are generally low in sodium 2. _____ Water may contain a considerable amount of sodium, depending on the source 3. _____ You can always tell if a food is high in sodium by tasting it 4. _____ Certain laxatives my be high in sodium and should be avoided unless prescribed   by a physician or pharmacist 5. _____ Salt substitutes may be used freely by anyone on a sodium restricted diet 6. _____ Sodium is present in table salt, food additives and as a natural component of   most foods 7. _____ Table salt is approximately 90% sodium 8. _____ Limiting sodium intake may help prevent excess fluid accumulation in the body 9. _____ On a sodium-restricted diet, seasonings such as bouillon soy sauce, and    cooking wine should be used in place of table salt 10. _____ On an ingredient list, a product which lists monosodium glutamate as the first   ingredient is an appropriate food to include on a low sodium diet  Circle the best answer(s) to the following statements (Hint: there may be more than one correct  answer)  11. On a low-sodium diet, some acceptable snack items are:    A. Olives  F. Bean dip   K. Grapefruit juice    B. Salted Pretzels G. Commercial Popcorn   L. Canned peaches    C. Carrot Sticks  H. Bouillon   M. Unsalted nuts   D. [place] fries  I. Peanut butter crackers N. Salami   E. Sweet pickles J. Tomato Juice   O. Pizza  12.  Seasonings that may be used freely on a reduced - sodium diet include   A. Lemon wedges F.Monosodium glutamate K. Celery seed    B.Soysauce   G. Pepper   L. Mustard powder   C. Sea salt  H. Cooking wine  M. Onion flakes   D. Vinegar  E. Prepared horseradish N. Salsa   E. Sage   J. Worcestershire sauce  O. Chutney

## 2020-06-30 ENCOUNTER — Telehealth: Payer: Self-pay

## 2020-06-30 NOTE — Patient Instructions (Signed)
CORNELIUS SCHUITEMA  06/30/2020     @PREFPERIOPPHARMACY @   Your procedure is scheduled on  07/06/2020.   Report to [facility] at  [phone]  A.M.   Call this number if you have problems the morning of surgery:  [phone]   Remember:  Follow the diet and prep instructions given to you by the office.                      Take these medicines the morning of surgery with A SIP OF WATER   Amlodipine, cymbalta, gabapentin, metoprolol.  DO NOT take any medications for diabetes the morning of your procedure.  If your glucose is 70 or below the morning of your procedure, drink 1/2 cup of clear juice and recheck your glucose in 15 minutes. If your glucose is still 70 or below, call [phone] for instructions.  If your glucose is 300 or above the morning of your procedure, call [phone] for instructions.     Please brush your teeth.  Do not wear jewelry, make-up or nail polish.  Do not wear lotions, powders, or perfumes, or deodorant.  Do not shave 48 hours prior to surgery.  Men may shave face and neck.  Do not bring valuables to the hospital.  [facility] is not responsible for any belongings or valuables.   Contacts, dentures or bridgework may not be worn into surgery.  Leave your suitcase in the car.  After surgery it may be brought to your room.  For patients admitted to the hospital, discharge time will be determined by your treatment team.  Patients discharged the day of surgery will not be allowed to drive home and must have someone with them for 24 hours.     Special instructions:  DO NOT smoke tobacco or vape for 24 hours before your procedure.   Please read over the following fact sheets that you were given. Anesthesia Post-op Instructions and Care and Recovery After Surgery       Colonoscopy, Adult, Care After This sheet gives you information about how to care for yourself after your procedure. Your health care provider may also give you more specific  instructions. If you have problems or questions, contact your health care provider. What can I expect after the procedure? After the procedure, it is common to have:  A small amount of blood in your stool for 24 hours after the procedure.  Some gas.  Mild cramping or bloating of your abdomen. Follow these instructions at home: Eating and drinking  Drink enough fluid to keep your urine pale yellow.  Follow instructions from your health care provider about eating or drinking restrictions.  Resume your normal diet as instructed by your health care provider. Avoid heavy or fried foods that are hard to digest.   Activity  Rest as told by your health care provider.  Avoid sitting for a long time without moving. Get up to take short walks every 1-2 hours. This is important to improve blood flow and breathing. Ask for help if you feel weak or unsteady.  Return to your normal activities as told by your health care provider. Ask your health care provider what activities are safe for you. Managing cramping and bloating  Try walking around when you have cramps or feel bloated.  Apply heat to your abdomen as told by your health care provider. Use the heat source that your health care provider recommends, such as a moist  heat pack or a heating pad. ? Place a towel between your skin and the heat source. ? Leave the heat on for 20-30 minutes. ? Remove the heat if your skin turns bright red. This is especially important if you are unable to feel pain, heat, or cold. You may have a greater risk of getting burned.   General instructions  If you were given a sedative during the procedure, it can affect you for several hours. Do not drive or operate machinery until your health care provider says that it is safe.  For the first 24 hours after the procedure: ? Do not sign important documents. ? Do not drink alcohol. ? Do your regular daily activities at a slower pace than normal. ? Eat soft foods  that are easy to digest.  Take over-the-counter and prescription medicines only as told by your health care provider.  Keep all follow-up visits as told by your health care provider. This is important. Contact a health care provider if:  You have blood in your stool 2-3 days after the procedure. Get help right away if you have:  More than a small spotting of blood in your stool.  Large blood clots in your stool.  Swelling of your abdomen.  Nausea or vomiting.  A fever.  Increasing pain in your abdomen that is not relieved with medicine. Summary  After the procedure, it is common to have a small amount of blood in your stool. You may also have mild cramping and bloating of your abdomen.  If you were given a sedative during the procedure, it can affect you for several hours. Do not drive or operate machinery until your health care provider says that it is safe.  Get help right away if you have a lot of blood in your stool, nausea or vomiting, a fever, or increased pain in your abdomen. This information is not intended to replace advice given to you by your health care provider. Make sure you discuss any questions you have with your health care provider. Document Revised: 03/08/2019 Document Reviewed: 10/08/2018 Elsevier Patient Education  2021 [place] Anesthesia, Adult, Care After This sheet gives you information about how to care for yourself after your procedure. Your health care provider may also give you more specific instructions. If you have problems or questions, contact your health care provider. What can I expect after the procedure? After the procedure, the following side effects are common:  Pain or discomfort at the IV site.  Nausea.  Vomiting.  Sore throat.  Trouble concentrating.  Feeling cold or chills.  Feeling weak or tired.  Sleepiness and fatigue.  Soreness and body aches. These side effects can affect parts of the body that were not  involved in surgery. Follow these instructions at home: For the time period you were told by your health care provider:  Rest.  Do not participate in activities where you could fall or become injured.  Do not drive or use machinery.  Do not drink alcohol.  Do not take sleeping pills or medicines that cause drowsiness.  Do not make important decisions or sign legal documents.  Do not take care of children on your own.   Eating and drinking  Follow any instructions from your health care provider about eating or drinking restrictions.  When you feel hungry, start by eating small amounts of foods that are soft and easy to digest (bland), such as toast. Gradually return to your regular diet.  Drink enough fluid  to keep your urine pale yellow.  If you vomit, rehydrate by drinking water, juice, or clear broth. General instructions  If you have sleep apnea, surgery and certain medicines can increase your risk for breathing problems. Follow instructions from your health care provider about wearing your sleep device: ? Anytime you are sleeping, including during daytime naps. ? While taking prescription pain medicines, sleeping medicines, or medicines that make you drowsy.  Have a responsible adult stay with you for the time you are told. It is important to have someone help care for you until you are awake and alert.  Return to your normal activities as told by your health care provider. Ask your health care provider what activities are safe for you.  Take over-the-counter and prescription medicines only as told by your health care provider.  If you smoke, do not smoke without supervision.  Keep all follow-up visits as told by your health care provider. This is important. Contact a health care provider if:  You have nausea or vomiting that does not get better with medicine.  You cannot eat or drink without vomiting.  You have pain that does not get better with medicine.  You  are unable to pass urine.  You develop a skin rash.  You have a fever.  You have redness around your IV site that gets worse. Get help right away if:  You have difficulty breathing.  You have chest pain.  You have blood in your urine or stool, or you vomit blood. Summary  After the procedure, it is common to have a sore throat or nausea. It is also common to feel tired.  Have a responsible adult stay with you for the time you are told. It is important to have someone help care for you until you are awake and alert.  When you feel hungry, start by eating small amounts of foods that are soft and easy to digest (bland), such as toast. Gradually return to your regular diet.  Drink enough fluid to keep your urine pale yellow.  Return to your normal activities as told by your health care provider. Ask your health care provider what activities are safe for you. This information is not intended to replace advice given to you by your health care provider. Make sure you discuss any questions you have with your health care provider. Document Revised: 11/28/2019 Document Reviewed: 06/27/2019 Elsevier Patient Education  2021 [employer].

## 2020-06-30 NOTE — Telephone Encounter (Signed)
-----   Message from Imogene Burn, PA-C sent at 06/29/2020  1:45 PM EDT ----- CXR normal no fluid in her lungs. Great news. No changes

## 2020-06-30 NOTE — Telephone Encounter (Signed)
Contacted patient who verbalized understanding of result. Patient had no questions or concerns at this time.

## 2020-07-02 ENCOUNTER — Encounter (HOSPITAL_COMMUNITY)
Admission: RE | Admit: 2020-07-02 | Discharge: 2020-07-02 | Disposition: A | Discharge status: Home / Self Care | Payer: Medicare Other | Source: Ambulatory Visit | Attending: Internal Medicine | Admitting: Internal Medicine

## 2020-07-02 ENCOUNTER — Other Ambulatory Visit: Payer: Self-pay

## 2020-07-02 ENCOUNTER — Encounter (HOSPITAL_COMMUNITY): Payer: Self-pay

## 2020-07-02 ENCOUNTER — Other Ambulatory Visit (HOSPITAL_COMMUNITY)
Admission: RE | Admit: 2020-07-02 | Discharge: 2020-07-02 | Disposition: A | Discharge status: Home / Self Care | Payer: Medicare Other | Source: Ambulatory Visit | Attending: Internal Medicine | Admitting: Internal Medicine

## 2020-07-02 DIAGNOSIS — Z01812 Encounter for preprocedural laboratory examination: Secondary | ICD-10-CM | POA: Diagnosis not present

## 2020-07-02 DIAGNOSIS — Z20822 Contact with and (suspected) exposure to covid-19: Secondary | ICD-10-CM | POA: Diagnosis not present

## 2020-07-02 LAB — CBC WITH DIFFERENTIAL/PLATELET
Abs Immature Granulocytes: 0.02 10*3/uL (ref 0.00–0.07)
Basophils Absolute: 0 10*3/uL (ref 0.0–0.1)
Basophils Relative: 0 %
Eosinophils Absolute: 0.1 10*3/uL (ref 0.0–0.5)
Eosinophils Relative: 2 %
HCT: 37.3 % (ref 36.0–46.0)
Hemoglobin: 11 g/dL — ABNORMAL LOW (ref 12.0–15.0)
Immature Granulocytes: 0 %
Lymphocytes Relative: 25 %
Lymphs Abs: 1.4 10*3/uL (ref 0.7–4.0)
MCH: 27.4 pg (ref 26.0–34.0)
MCHC: 29.5 g/dL — ABNORMAL LOW (ref 30.0–36.0)
MCV: 93 fL (ref 80.0–100.0)
Monocytes Absolute: 0.7 10*3/uL (ref 0.1–1.0)
Monocytes Relative: 11 %
Neutro Abs: 3.6 10*3/uL (ref 1.7–7.7)
Neutrophils Relative %: 62 %
Platelets: 281 10*3/uL (ref 150–400)
RBC: 4.01 MIL/uL (ref 3.87–5.11)
RDW: 13.1 % (ref 11.5–15.5)
WBC: 5.8 10*3/uL (ref 4.0–10.5)
nRBC: 0 % (ref 0.0–0.2)

## 2020-07-02 LAB — BASIC METABOLIC PANEL
Anion gap: 13 (ref 5–15)
BUN: 22 mg/dL (ref 8–23)
CO2: 22 mmol/L (ref 22–32)
Calcium: 9.8 mg/dL (ref 8.9–10.3)
Chloride: 103 mmol/L (ref 98–111)
Creatinine, Ser: 1.27 mg/dL — ABNORMAL HIGH (ref 0.44–1.00)
GFR, Estimated: 46 mL/min — ABNORMAL LOW (ref >60)
Glucose, Bld: 181 mg/dL — ABNORMAL HIGH (ref 70–99)
Potassium: 4.2 mmol/L (ref 3.5–5.1)
Sodium: 138 mmol/L (ref 135–145)

## 2020-07-03 LAB — SARS CORONAVIRUS 2 (TAT 6-24 HRS): SARS Coronavirus 2: NEGATIVE

## 2020-07-06 ENCOUNTER — Ambulatory Visit (HOSPITAL_COMMUNITY): Payer: Medicare Other | Admitting: Anesthesiology

## 2020-07-06 ENCOUNTER — Encounter (HOSPITAL_COMMUNITY): Payer: Self-pay | Admitting: Internal Medicine

## 2020-07-06 ENCOUNTER — Encounter (HOSPITAL_COMMUNITY)
Admission: RE | Disposition: A | Discharge status: Home / Self Care | Payer: Self-pay | Source: Home / Self Care | Attending: Internal Medicine

## 2020-07-06 ENCOUNTER — Ambulatory Visit (HOSPITAL_COMMUNITY)
Admission: RE | Admit: 2020-07-06 | Discharge: 2020-07-06 | Disposition: A | Discharge status: Home / Self Care | Payer: Medicare Other | Attending: Internal Medicine | Admitting: Internal Medicine

## 2020-07-06 DIAGNOSIS — Z86010 Personal history of colonic polyps: Secondary | ICD-10-CM | POA: Diagnosis not present

## 2020-07-06 DIAGNOSIS — D123 Benign neoplasm of transverse colon: Secondary | ICD-10-CM | POA: Insufficient documentation

## 2020-07-06 DIAGNOSIS — E1140 Type 2 diabetes mellitus with diabetic neuropathy, unspecified: Secondary | ICD-10-CM | POA: Diagnosis not present

## 2020-07-06 DIAGNOSIS — K5730 Diverticulosis of large intestine without perforation or abscess without bleeding: Secondary | ICD-10-CM | POA: Diagnosis not present

## 2020-07-06 DIAGNOSIS — Z6841 Body Mass Index (BMI) 40.0 and over, adult: Secondary | ICD-10-CM | POA: Insufficient documentation

## 2020-07-06 DIAGNOSIS — Z79899 Other long term (current) drug therapy: Secondary | ICD-10-CM | POA: Diagnosis not present

## 2020-07-06 DIAGNOSIS — K635 Polyp of colon: Secondary | ICD-10-CM

## 2020-07-06 DIAGNOSIS — Z7982 Long term (current) use of aspirin: Secondary | ICD-10-CM | POA: Insufficient documentation

## 2020-07-06 DIAGNOSIS — Z881 Allergy status to other antibiotic agents status: Secondary | ICD-10-CM | POA: Insufficient documentation

## 2020-07-06 DIAGNOSIS — Z1211 Encounter for screening for malignant neoplasm of colon: Secondary | ICD-10-CM | POA: Diagnosis not present

## 2020-07-06 DIAGNOSIS — Z794 Long term (current) use of insulin: Secondary | ICD-10-CM | POA: Diagnosis not present

## 2020-07-06 DIAGNOSIS — E669 Obesity, unspecified: Secondary | ICD-10-CM | POA: Diagnosis not present

## 2020-07-06 HISTORY — PX: POLYPECTOMY: SHX149

## 2020-07-06 HISTORY — PX: COLONOSCOPY WITH PROPOFOL: SHX5780

## 2020-07-06 LAB — GLUCOSE, CAPILLARY
Glucose-Capillary: 147 mg/dL — ABNORMAL HIGH (ref 70–99)
Glucose-Capillary: 125 mg/dL — ABNORMAL HIGH (ref 70–99)

## 2020-07-06 SURGERY — COLONOSCOPY WITH PROPOFOL
Anesthesia: General

## 2020-07-06 MED ORDER — GLYCOPYRROLATE 0.2 MG/ML IJ SOLN
INTRAMUSCULAR | Status: DC | PRN
  Administered 2020-07-06: .2 mg via INTRAVENOUS

## 2020-07-06 MED ORDER — ONDANSETRON HCL 4 MG/2ML IJ SOLN
INTRAMUSCULAR | Status: DC | PRN
  Administered 2020-07-06: 4 mg via INTRAVENOUS

## 2020-07-06 MED ORDER — PROPOFOL 10 MG/ML IV BOLUS
INTRAVENOUS | Status: AC
  Filled 2020-07-06: qty 60

## 2020-07-06 MED ORDER — EPHEDRINE SULFATE 50 MG/ML IJ SOLN
INTRAMUSCULAR | Status: DC | PRN
  Administered 2020-07-06 (×2): 10 mg via INTRAVENOUS

## 2020-07-06 MED ORDER — LIDOCAINE HCL (CARDIAC) PF 50 MG/5ML IV SOSY
PREFILLED_SYRINGE | INTRAVENOUS | Status: DC | PRN
  Administered 2020-07-06: 80 mg via INTRAVENOUS
  Administered 2020-07-06: 150 mg via INTRAVENOUS

## 2020-07-06 MED ORDER — SUGAMMADEX SODIUM 200 MG/2ML IV SOLN
INTRAVENOUS | Status: DC | PRN
  Administered 2020-07-06: 200 mg via INTRAVENOUS

## 2020-07-06 MED ORDER — ONDANSETRON HCL 4 MG/2ML IJ SOLN
INTRAMUSCULAR | Status: AC
  Filled 2020-07-06: qty 4

## 2020-07-06 MED ORDER — LIDOCAINE HCL (PF) 2 % IJ SOLN
INTRAMUSCULAR | Status: AC
  Filled 2020-07-06: qty 5

## 2020-07-06 MED ORDER — FENTANYL CITRATE (PF) 100 MCG/2ML IJ SOLN
INTRAMUSCULAR | Status: DC | PRN
  Administered 2020-07-06: 50 ug via INTRAVENOUS

## 2020-07-06 MED ORDER — STERILE WATER FOR IRRIGATION IR SOLN
Status: DC | PRN
  Administered 2020-07-06: 200 mL

## 2020-07-06 MED ORDER — LACTATED RINGERS IV SOLN
INTRAVENOUS | Status: DC
  Administered 2020-07-06: 1000 mL via INTRAVENOUS

## 2020-07-06 MED ORDER — SUCCINYLCHOLINE CHLORIDE 200 MG/10ML IV SOSY
PREFILLED_SYRINGE | INTRAVENOUS | Status: AC
  Filled 2020-07-06: qty 10

## 2020-07-06 MED ORDER — FENTANYL CITRATE (PF) 100 MCG/2ML IJ SOLN
INTRAMUSCULAR | Status: AC
  Filled 2020-07-06: qty 2

## 2020-07-06 MED ORDER — SUCCINYLCHOLINE 20MG/ML (10ML) SYRINGE FOR MEDFUSION PUMP - OPTIME
INTRAMUSCULAR | Status: DC | PRN
  Administered 2020-07-06: 140 mg via INTRAVENOUS

## 2020-07-06 MED ORDER — PHENYLEPHRINE HCL (PRESSORS) 10 MG/ML IV SOLN
INTRAVENOUS | Status: DC | PRN
  Administered 2020-07-06: 80 ug via INTRAVENOUS

## 2020-07-06 MED ORDER — ROCURONIUM 10MG/ML (10ML) SYRINGE FOR MEDFUSION PUMP - OPTIME
INTRAVENOUS | Status: DC | PRN
  Administered 2020-07-06: 20 mg via INTRAVENOUS

## 2020-07-06 NOTE — Anesthesia Preprocedure Evaluation (Signed)
Anesthesia Evaluation  Patient identified by MRN, date of birth, ID band Patient awake    Reviewed: Allergy & Precautions, NPO status , Patient's Chart, lab work & pertinent test results, reviewed documented beta blocker date and time   History of Anesthesia Complications Negative for: history of anesthetic complications  Airway Mallampati: II  TM Distance: >3 FB Neck ROM: Full    Dental  (+) Dental Advisory Given, Chipped, Missing   Pulmonary shortness of breath and with exertion,  sarcoidosis           Cardiovascular Exercise Tolerance: Poor hypertension, Pt. on medications and Pt. on home beta blockers + DOE  Normal cardiovascular exam+ Valvular Problems/Murmurs  Rhythm:Regular Rate:Normal  1. Left ventricular ejection fraction, by estimation, is 60 to 65%. The left ventricle has normal function. The left ventricle has no regional wall motion abnormalities.  2. Right ventricular systolic function is normal. The right ventricular size is normal. Tricuspid regurgitation signal is inadequate for assessing PA pressure.  3. Fluid collection posterior to left venticle is most consistent with pleural effusion.  4. The mitral valve is grossly normal. Trivial mitral valve  regurgitation.  5. The aortic valve has an indeterminant number of cusps. There is moderate calcification of the aortic valve. Aortic valve regurgitation is not visualized. Mild to moderate aortic valve stenosis. Aortic valve mean gradient measures 18.5 mmHg. Aortic valve Vmax measures 2.94 m/s. Dimentionless index 0.41.  6. The inferior vena cava is normal in size with greater than 50% respiratory variability, suggesting right atrial pressure of 3 mmHg.   Neuro/Psych  Neuromuscular disease    GI/Hepatic Neg liver ROS, GERD  ,  Endo/Other  diabetes, Well Controlled, Type 2, Oral Hypoglycemic Agents, Insulin DependentMorbid obesity  Renal/GU negative Renal ROS      Musculoskeletal  (+) Arthritis ,   Abdominal   Peds  Hematology  (+) anemia ,   Anesthesia Other Findings   Reproductive/Obstetrics                           Anesthesia Physical Anesthesia Plan  ASA: III  Anesthesia Plan: General   Post-op Pain Management:    Induction:   PONV Risk Score and Plan: 3 and Ondansetron  Airway Management Planned: Oral ETT  Additional Equipment:   Intra-op Plan:   Post-operative Plan: Extubation in OR  Informed Consent: I have reviewed the patients History and Physical, chart, labs and discussed the procedure including the risks, benefits and alternatives for the proposed anesthesia with the patient or authorized representative who has indicated his/her understanding and acceptance.     Dental advisory given  Plan Discussed with: CRNA and Surgeon  Anesthesia Plan Comments:         Anesthesia Quick Evaluation

## 2020-07-06 NOTE — H&P (Signed)
@LOGO @   Primary Care Physician:  Caren Macadam, MD Primary Gastroenterologist:  Dr. Gala Romney  Pre-Procedure History & Physical: HPI:  Ann Roberson is a 70 y.o. female here for surveillance colonoscopy.  History of advanced adenomas removed 3 years ago.  Requiring piecemeal resection.  Respiratory excursion with deep sedation made exam more difficult previously.  Did better with endotracheal intubation at the last session.  Today we are doing a surveillance colonoscopy with general anesthesia /intubation the same manner.  Past Medical History:  Diagnosis Date  . Anemia   . Arthritis   . Diabetes mellitus   . GERD (gastroesophageal reflux disease)   . Heart murmur   . Hyperlipidemia   . Hypertension   . Neuropathy   . Sarcoidosis     Past Surgical History:  Procedure Laterality Date  . BIOPSY  02/08/2018   Procedure: BIOPSY;  Surgeon: Daneil Dolin, MD;  Location: [facility];  Service: Endoscopy;;  . CESAREAN SECTION    . CHOLECYSTECTOMY    . COLONOSCOPY WITH PROPOFOL N/A 07/06/2017   Dr. Gala Romney: Diverticulosis.five 8 to 16 mm polyps in the ascending colon and in the cecum, removed with hot snare.  Largest polyp could not be removed completely, status post inking.  Pathology revealed cecal tubular adenoma, a sending colon polyps with multiple fragments of tubular adenomas, tubulovillous adenoma, no high-grade dysplasia or malignancy.  Tubular adenomas removed from the rectum as   . COLONOSCOPY WITH PROPOFOL N/A 02/08/2018   12 mm tubular adenoma removed from the ileocecal valve, hyperplastic sigmoid colon polyp removed.  Possible residual polypoid tissue at the ascending colon segment previously tattooed, biopsied (tubular adenoma) and ablated with APC.  Recommend 2-year surveillance study.  [name] MASTECTOMY, PARTIAL Left    benign  . POLYPECTOMY  07/06/2017   Procedure: POLYPECTOMY;  Surgeon: Daneil Dolin, MD;  Location: [facility];  Service: Endoscopy;;  cecal polyp cs,  ascending colon polyp hs  . POLYPECTOMY  02/08/2018   Procedure: POLYPECTOMY;  Surgeon: Daneil Dolin, MD;  Location: [facility];  Service: Endoscopy;;  . TUBAL LIGATION      Prior to Admission medications   Medication Sig Start Date End Date Taking? Authorizing Provider  acetaminophen (TYLENOL) 650 MG CR tablet Take 1,300 mg by mouth 2 (two) times daily.   Yes [provider]  amLODipine (NORVASC) 10 MG tablet Take 10 mg by mouth in the morning.   Yes [provider]  aspirin EC 81 MG tablet Take 81 mg by mouth in the morning. Swallow whole.   Yes [provider]  atorvastatin (LIPITOR) 20 MG tablet Take 1 tablet by mouth daily. 06/19/20  Yes [provider]  Cyanocobalamin (VITAMIN B-12) 2500 MCG SUBL Place 2,500 mcg under the tongue in the morning.   Yes [provider]  diclofenac Sodium (VOLTAREN) 1 % GEL Apply 1 application topically 2 (two) times daily as needed. 04/02/20  Yes [provider]  DULoxetine (CYMBALTA) 60 MG capsule Take 60 mg by mouth at bedtime. 10/05/18  Yes [provider]  gabapentin (NEURONTIN) 300 MG capsule TAKE 2 CAPSULES BY MOUTH THREE TIMES DAILY Patient taking differently: Take 600 mg by mouth 3 (three) times daily. 11/24/17  Yes Hagler, Apolonio Schneiders, MD  hydrochlorothiazide (HYDRODIURIL) 25 MG tablet Take 25 mg by mouth every other day. In the morning 07/27/16  Yes [provider]  LANTUS SOLOSTAR 100 UNIT/ML Solostar Pen Inject 50 Units into the skin daily at 10 pm.  12/31/19  Yes [provider]  losartan (COZAAR) 100 MG tablet TAKE 1 TABLET BY MOUTH ONCE DAILY Patient taking differently: Take 100 mg by mouth in the morning. 12/11/17  Yes Hagler, Apolonio Schneiders, MD  metFORMIN (GLUCOPHAGE) 1000 MG tablet TAKE 1 TABLET BY MOUTH TWICE DAILY Patient taking differently: Take 1,000 mg by mouth in the morning and at bedtime. 12/11/17  Yes Hagler, Apolonio Schneiders, MD  metoprolol tartrate (LOPRESSOR) 25 MG tablet  TAKE 1 TABLET BY MOUTH TWICE DAILY Patient taking differently: Take 25 mg by mouth 2 (two) times daily. 01/13/20  Yes Isaiah Serge, NP  OZEMPIC, 0.25 OR 0.5 MG/DOSE, 2 MG/1.5ML SOPN Inject 0.5 mg into the skin every Sunday. 10/21/19  Yes [provider]  polyethylene glycol-electrolytes (NULYTELY) 420 g solution As directed 05/18/20  Yes Elohim Brune, Cristopher Estimable, MD  potassium chloride SA (K-DUR,KLOR-CON) 20 MEQ tablet Take 1 tablet (20 mEq total) by mouth daily. Patient taking differently: Take 20 mEq by mouth every other day. In the morning 08/30/17  Yes Hagler, Apolonio Schneiders, MD  B-D UF III MINI PEN NEEDLES 31G X 5 MM MISC USE WITH LANTUS PEN 09/26/17   Caren Macadam, MD  nitroGLYCERIN (NITROSTAT) 0.4 MG SL tablet Place 1 tablet (0.4 mg total) under the tongue every 5 (five) minutes as needed. Patient taking differently: Place 0.4 mg under the tongue every 5 (five) minutes x 3 doses as needed for chest pain. 08/29/17   Herminio Commons, MD    Allergies as of 05/18/2020 - Review Complete 05/18/2020  Allergen Reaction Noted  . Erythromycin Nausea And Vomiting 01/29/2011    Family History  Problem Relation Age of Onset  . Asthma Brother   . Cancer Mother        stomach  . Cancer Father        prostate  . Cancer Brother        ?  . Diabetes Brother   . Allergic rhinitis Neg Hx   . Angioedema Neg Hx   . Atopy Neg Hx   . Eczema Neg Hx   . Immunodeficiency Neg Hx   . Urticaria Neg Hx   . Colon cancer Neg Hx     Social History   Socioeconomic History  . Marital status: Single    Spouse name: Not on file  . Number of children: Not on file  . Years of education: Not on file  . Highest education level: Not on file  Occupational History  . Not on file  Tobacco Use  . Smoking status: Never Smoker  . Smokeless tobacco: Never Used  Vaping Use  . Vaping Use: Never used  Substance and Sexual Activity  . Alcohol use: No  . Drug use: No  . Sexual activity: Not Currently    Birth  control/protection: Post-menopausal  Other Topics Concern  . Not on file  Social History Narrative   Lives in [place], [place]. 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   Social Determinants of Health   Financial Resource Strain: Not on file  Food Insecurity: Not on file  Transportation Needs: Not on file  Physical Activity: Not on file  Stress: Not on file  Social Connections: Not on file  Intimate Partner Violence: Not on file    Review of Systems: See HPI, otherwise negative ROS  Physical Exam: BP 136/71   Pulse 81   Temp 98.1 F (36.7 C) (Oral)   Resp 19  SpO2 97%  General:   Alert,  Well-developed, well-nourished, pleasant and cooperative in NAD Neck:  Supple; no masses or thyromegaly. No significant cervical adenopathy. Lungs:  Clear throughout to auscultation.   No wheezes, crackles, or rhonchi. No acute distress. Heart:  Regular rate and rhythm; no murmurs, clicks, rubs,  or gallops. Abdomen: Obese.  Non-distended, normal bowel sounds.  Soft and nontender without appreciable mass or hepatosplenomegaly.  Pulses:  Normal pulses noted. Extremities:  Without clubbing or edema.  Impression/Plan: 70 year old obese lady with a history of multiple advanced adenomas removed 3 years ago.  Respiratory excursion made exam/intervention challenging previously.  Did better with endotracheal intubation previously.  Due for surveillance at this time.  Plan for endotracheal intubation is is worked best at the last session.  The risks, benefits, limitations, alternatives and imponderables have been reviewed with the patient. Questions have been answered. All parties are agreeable.      Notice: This dictation was prepared with Dragon dictation along with smaller phrase technology. Any transcriptional errors that result from this process are unintentional and may not be corrected upon review.

## 2020-07-06 NOTE — Anesthesia Postprocedure Evaluation (Signed)
Anesthesia Post Note  Patient: Ann Roberson  Procedure(s) Performed: COLONOSCOPY WITH PROPOFOL (N/A ) POLYPECTOMY INTESTINAL  Patient location during evaluation: PACU Anesthesia Type: General Level of consciousness: awake and alert and oriented Pain management: pain level controlled Respiratory status: spontaneous breathing and respiratory function stable Cardiovascular status: blood pressure returned to baseline and stable Postop Assessment: no apparent nausea or vomiting Anesthetic complications: no   No complications documented.   Last Vitals:  Vitals:   07/06/20 0815 07/06/20 0833  BP: 125/69 120/66  Pulse: 96 93  Resp: 15 18  Temp:  37 C  SpO2: 95% 93%    Last Pain:  Vitals:   07/06/20 0833  TempSrc: Oral  PainSc: 0-No pain                 Infant Doane C Keithan Dileonardo

## 2020-07-06 NOTE — Discharge Instructions (Signed)
Colonoscopy Discharge Instructions  Read the instructions outlined below and refer to this sheet in the next few weeks. These discharge instructions provide you with general information on caring for yourself after you leave the hospital. Your doctor may also give you specific instructions. While your treatment has been planned according to the most current medical practices available, unavoidable complications occasionally occur. If you have any problems or questions after discharge, call Dr. Gala Romney at [phone]. ACTIVITY  You may resume your regular activity, but move at a slower pace for the next 24 hours.   Take frequent rest periods for the next 24 hours.   Walking will help get rid of the air and reduce the bloated feeling in your belly (abdomen).   No driving for 24 hours (because of the medicine (anesthesia) used during the test).    Do not sign any important legal documents or operate any machinery for 24 hours (because of the anesthesia used during the test).  NUTRITION  Drink plenty of fluids.   You may resume your normal diet as instructed by your doctor.   Begin with a light meal and progress to your normal diet. Heavy or fried foods are harder to digest and may make you feel sick to your stomach (nauseated).   Avoid alcoholic beverages for 24 hours or as instructed.  MEDICATIONS  You may resume your normal medications unless your doctor tells you otherwise.  WHAT YOU CAN EXPECT TODAY  Some feelings of bloating in the abdomen.   Passage of more gas than usual.   Spotting of blood in your stool or on the toilet paper.  IF YOU HAD POLYPS REMOVED DURING THE COLONOSCOPY:  No aspirin products for 7 days or as instructed.   No alcohol for 7 days or as instructed.   Eat a soft diet for the next 24 hours.  FINDING OUT THE RESULTS OF YOUR TEST Not all test results are available during your visit. If your test results are not back during the visit, make an appointment  with your caregiver to find out the results. Do not assume everything is normal if you have not heard from your caregiver or the medical facility. It is important for you to follow up on all of your test results.  SEEK IMMEDIATE MEDICAL ATTENTION IF:  You have more than a spotting of blood in your stool.   Your belly is swollen (abdominal distention).   You are nauseated or vomiting.   You have a temperature over 101.   You have abdominal pain or discomfort that is severe or gets worse throughout the day.    1 small polyp removed from your colon today  Further recommendations to follow pending review of pathology report  At patient request I called Sharmaine Base at [phone] -discussed results      Monitored Anesthesia Care, Care After This sheet gives you information about how to care for yourself after your procedure. Your health care provider may also give you more specific instructions. If you have problems or questions, contact your health care provider. What can I expect after the procedure? After the procedure, it is common to have:  Tiredness.  Forgetfulness about what happened after the procedure.  Impaired judgment for important decisions.  Nausea or vomiting.  Some difficulty with balance. Follow these instructions at home: For the time period you were told by your health care provider:  Rest as needed.  Do not participate in activities where you could fall or become injured.  Do not drive or use machinery.  Do not drink alcohol.  Do not take sleeping pills or medicines that cause drowsiness.  Do not make important decisions or sign legal documents.  Do not take care of children on your own.      Eating and drinking  Follow the diet that is recommended by your health care provider.  Drink enough fluid to keep your urine pale yellow.  If you vomit: ? Drink water, juice, or soup when you can drink without vomiting. ? Make sure you have  little or no nausea before eating solid foods. General instructions  Have a responsible adult stay with you for the time you are told. It is important to have someone help care for you until you are awake and alert.  Take over-the-counter and prescription medicines only as told by your health care provider.  If you have sleep apnea, surgery and certain medicines can increase your risk for breathing problems. Follow instructions from your health care provider about wearing your sleep device: ? Anytime you are sleeping, including during daytime naps. ? While taking prescription pain medicines, sleeping medicines, or medicines that make you drowsy.  Avoid smoking.  Keep all follow-up visits as told by your health care provider. This is important. Contact a health care provider if:  You keep feeling nauseous or you keep vomiting.  You feel light-headed.  You are still sleepy or having trouble with balance after 24 hours.  You develop a rash.  You have a fever.  You have redness or swelling around the IV site. Get help right away if:  You have trouble breathing.  You have new-onset confusion at home. Summary  For several hours after your procedure, you may feel tired. You may also be forgetful and have poor judgment.  Have a responsible adult stay with you for the time you are told. It is important to have someone help care for you until you are awake and alert.  Rest as told. Do not drive or operate machinery. Do not drink alcohol or take sleeping pills.  Get help right away if you have trouble breathing, or if you suddenly become confused. This information is not intended to replace advice given to you by your health care provider. Make sure you discuss any questions you have with your health care provider. Document Revised: 11/28/2019 Document Reviewed: 02/14/2019 Elsevier Patient Education  2021 [employer].

## 2020-07-06 NOTE — Op Note (Signed)
[facility] Patient Name: Ann Roberson Procedure Date: 07/06/2020 7:00 AM MRN: [id number] Date of Birth: [birth year] Attending MD: Norvel Richards , MD CSN: [id number] Age: 70 Admit Type: Outpatient Procedure:                Colonoscopy Indications:              High risk colon cancer surveillance: Personal                            history of colonic polyps Providers:                Norvel Richards, MD, Crystal Page, Aram Candela Referring MD:             Caren Macadam Medicines:                General Anesthesia Complications:            No immediate complications. Estimated Blood Loss:     Estimated blood loss was minimal. Estimated blood                            loss was minimal. Procedure:                Pre-Anesthesia Assessment:                           - Prior to the procedure, a History and Physical                            was performed, and patient medications and                            allergies were reviewed. The patient's tolerance of                            previous anesthesia was also reviewed. The risks                            and benefits of the procedure and the sedation                            options and risks were discussed with the patient.                            All questions were answered, and informed consent                            was obtained. Prior Anticoagulants: The patient has                            taken no previous anticoagulant or antiplatelet                            agents. ASA Grade Assessment: III - A patient with  severe systemic disease. After reviewing the risks                            and benefits, the patient was deemed in                            satisfactory condition to undergo the procedure.                           After obtaining informed consent, the colonoscope                            was passed under direct vision. Throughout the                             procedure, the patient's blood pressure, pulse, and                            oxygen saturations were monitored continuously. The                            CF-HQ190L ([id number]) scope was introduced through                            the anus and advanced to the the cecum, identified                            by appendiceal orifice and ileocecal valve. The                            colonoscopy was performed without difficulty. The                            patient tolerated the procedure well. The quality                            of the bowel preparation was adequate. Scope In: 7:41:26 AM Scope Out: 7:57:12 AM Scope Withdrawal Time: 0 hours 9 minutes 59 seconds  Total Procedure Duration: 0 hours 15 minutes 46 seconds  Findings:      The perianal and digital rectal examinations were normal.      A 5 mm polyp was found in the distal transverse colon. The polyp was       sessile. The polyp was removed with a cold snare. Resection and       retrieval were complete. Estimated blood loss was minimal. The polyp was       removed with a cold snare. Resection and retrieval were complete.       Estimated blood loss was minimal.      A few small-mouthed diverticula were found in the entire colon.      The exam was otherwise without abnormality on direct and retroflexion       views. Impression:               - One 5 mm polyp in the distal transverse colon,  removed with a cold snare. Resected and retrieved.                           - Diverticulosis in the entire examined colon.                           - The examination was otherwise normal on direct                            and retroflexion views. Moderate Sedation:      Moderate (conscious) sedation was personally administered by an       anesthesia professional. The following parameters were monitored: oxygen       saturation, heart rate, blood pressure, respiratory rate, EKG, adequacy       of  pulmonary ventilation, and response to care. Recommendation:           - Patient has a contact number available for                            emergencies. The signs and symptoms of potential                            delayed complications were discussed with the                            patient. Return to normal activities tomorrow.                            Written discharge instructions were provided to the                            patient.                           - Advance diet as tolerated.                           - Continue present medications.                           - Repeat colonoscopy date to be determined after                            pending pathology results are reviewed for                            surveillance.                           - Return to GI office (date not yet determined). Procedure Code(s):        --- Professional ---                           [phone], Colonoscopy, flexible; with removal of  tumor(s), polyp(s), or other lesion(s) by snare                            technique Diagnosis Code(s):        --- Professional ---                           Z86.010, Personal history of colonic polyps                           K63.5, Polyp of colon                           K57.30, Diverticulosis of large intestine without                            perforation or abscess without bleeding CPT copyright 2019 American Medical Association. All rights reserved. The codes documented in this report are preliminary and upon coder review may  be revised to meet current compliance requirements. Cristopher Estimable. Jadriel Saxer, MD Norvel Richards, MD 07/06/2020 8:09:31 AM This report has been signed electronically. Number of Addenda: 0

## 2020-07-06 NOTE — Transfer of Care (Signed)
Immediate Anesthesia Transfer of Care Note  Patient: Ann Roberson  Procedure(s) Performed: COLONOSCOPY WITH PROPOFOL (N/A ) POLYPECTOMY INTESTINAL  Patient Location: PACU  Anesthesia Type:General  Level of Consciousness: awake  Airway & Oxygen Therapy: Patient Spontanous Breathing  Post-op Assessment: Report given to RN  Post vital signs: Reviewed and stable  Last Vitals:  Vitals Value Taken Time  BP 136/98 07/06/20 0810  Temp    Pulse 97 07/06/20 0810  Resp 19 07/06/20 0813  SpO2 94 % 07/06/20 0810  Vitals shown include unvalidated device data.  Last Pain:  Vitals:   07/06/20 0728  TempSrc:   PainSc: 0-No pain      Patients Stated Pain Goal: 6 (14/48/18 5631)  Complications: No complications documented.

## 2020-07-06 NOTE — Anesthesia Procedure Notes (Signed)
Procedure Name: Intubation Date/Time: 07/06/2020 7:35 AM Performed by: Ollen Bowl, CRNA Pre-anesthesia Checklist: Patient identified, Patient being monitored, Timeout performed, Emergency Drugs available and Suction available Patient Re-evaluated:Patient Re-evaluated prior to induction Oxygen Delivery Method: Circle system utilized Preoxygenation: Pre-oxygenation with 100% oxygen Induction Type: IV induction Ventilation: Mask ventilation without difficulty Laryngoscope Size: Mac and 3 Grade View: Grade I Tube type: Oral Tube size: 7.0 mm Number of attempts: 1 Airway Equipment and Method: Stylet Placement Confirmation: ETT inserted through vocal cords under direct vision,  positive ETCO2 and breath sounds checked- equal and bilateral Secured at: 21 cm Tube secured with: Tape Dental Injury: Teeth and Oropharynx as per pre-operative assessment

## 2020-07-07 ENCOUNTER — Encounter: Payer: Self-pay | Admitting: Internal Medicine

## 2020-07-07 LAB — SURGICAL PATHOLOGY

## 2020-07-09 ENCOUNTER — Encounter (HOSPITAL_COMMUNITY): Payer: Self-pay | Admitting: Internal Medicine

## 2020-07-12 ENCOUNTER — Other Ambulatory Visit: Payer: Self-pay

## 2020-07-12 ENCOUNTER — Encounter (HOSPITAL_COMMUNITY): Payer: Self-pay

## 2020-07-12 ENCOUNTER — Emergency Department (HOSPITAL_COMMUNITY): Payer: Medicare Other

## 2020-07-12 ENCOUNTER — Observation Stay (HOSPITAL_COMMUNITY)
Admission: EM | Admit: 2020-07-12 | Discharge: 2020-07-13 | Disposition: A | Discharge status: Home / Self Care | Payer: Medicare Other | Attending: Internal Medicine | Admitting: Internal Medicine

## 2020-07-12 DIAGNOSIS — E041 Nontoxic single thyroid nodule: Secondary | ICD-10-CM | POA: Insufficient documentation

## 2020-07-12 DIAGNOSIS — Z7982 Long term (current) use of aspirin: Secondary | ICD-10-CM | POA: Diagnosis not present

## 2020-07-12 DIAGNOSIS — R059 Cough, unspecified: Secondary | ICD-10-CM | POA: Diagnosis not present

## 2020-07-12 DIAGNOSIS — I10 Essential (primary) hypertension: Secondary | ICD-10-CM | POA: Insufficient documentation

## 2020-07-12 DIAGNOSIS — E1169 Type 2 diabetes mellitus with other specified complication: Secondary | ICD-10-CM

## 2020-07-12 DIAGNOSIS — Z79899 Other long term (current) drug therapy: Secondary | ICD-10-CM | POA: Insufficient documentation

## 2020-07-12 DIAGNOSIS — R0602 Shortness of breath: Secondary | ICD-10-CM | POA: Diagnosis not present

## 2020-07-12 DIAGNOSIS — R0989 Other specified symptoms and signs involving the circulatory and respiratory systems: Secondary | ICD-10-CM | POA: Diagnosis not present

## 2020-07-12 DIAGNOSIS — Z20822 Contact with and (suspected) exposure to covid-19: Secondary | ICD-10-CM | POA: Diagnosis not present

## 2020-07-12 DIAGNOSIS — E876 Hypokalemia: Secondary | ICD-10-CM

## 2020-07-12 DIAGNOSIS — E042 Nontoxic multinodular goiter: Secondary | ICD-10-CM | POA: Diagnosis present

## 2020-07-12 DIAGNOSIS — Z7984 Long term (current) use of oral hypoglycemic drugs: Secondary | ICD-10-CM | POA: Diagnosis not present

## 2020-07-12 DIAGNOSIS — Z794 Long term (current) use of insulin: Secondary | ICD-10-CM | POA: Insufficient documentation

## 2020-07-12 DIAGNOSIS — J9601 Acute respiratory failure with hypoxia: Principal | ICD-10-CM | POA: Insufficient documentation

## 2020-07-12 DIAGNOSIS — Z8616 Personal history of COVID-19: Secondary | ICD-10-CM | POA: Diagnosis not present

## 2020-07-12 DIAGNOSIS — J029 Acute pharyngitis, unspecified: Secondary | ICD-10-CM | POA: Diagnosis not present

## 2020-07-12 DIAGNOSIS — R062 Wheezing: Secondary | ICD-10-CM

## 2020-07-12 DIAGNOSIS — E119 Type 2 diabetes mellitus without complications: Secondary | ICD-10-CM | POA: Insufficient documentation

## 2020-07-12 DIAGNOSIS — E1159 Type 2 diabetes mellitus with other circulatory complications: Secondary | ICD-10-CM | POA: Diagnosis present

## 2020-07-12 DIAGNOSIS — I152 Hypertension secondary to endocrine disorders: Secondary | ICD-10-CM | POA: Diagnosis present

## 2020-07-12 DIAGNOSIS — Z8709 Personal history of other diseases of the respiratory system: Secondary | ICD-10-CM | POA: Diagnosis not present

## 2020-07-12 LAB — BASIC METABOLIC PANEL
Anion gap: 8 (ref 5–15)
BUN: 20 mg/dL (ref 8–23)
CO2: 22 mmol/L (ref 22–32)
Calcium: 8.6 mg/dL — ABNORMAL LOW (ref 8.9–10.3)
Chloride: 108 mmol/L (ref 98–111)
Creatinine, Ser: 1.2 mg/dL — ABNORMAL HIGH (ref 0.44–1.00)
GFR, Estimated: 50 mL/min — ABNORMAL LOW (ref >60)
Glucose, Bld: 197 mg/dL — ABNORMAL HIGH (ref 70–99)
Potassium: 4.6 mmol/L (ref 3.5–5.1)
Sodium: 138 mmol/L (ref 135–145)

## 2020-07-12 LAB — GLUCOSE, CAPILLARY: Glucose-Capillary: 338 mg/dL — ABNORMAL HIGH (ref 70–99)

## 2020-07-12 LAB — RESP PANEL BY RT-PCR (FLU A&B, COVID) ARPGX2
Influenza A by PCR: NEGATIVE
Influenza B by PCR: NEGATIVE
SARS Coronavirus 2 by RT PCR: NEGATIVE

## 2020-07-12 LAB — CBC WITH DIFFERENTIAL/PLATELET
Abs Immature Granulocytes: 0.02 10*3/uL (ref 0.00–0.07)
Basophils Absolute: 0 10*3/uL (ref 0.0–0.1)
Basophils Relative: 0 %
Eosinophils Absolute: 0.2 10*3/uL (ref 0.0–0.5)
Eosinophils Relative: 3 %
HCT: 35.3 % — ABNORMAL LOW (ref 36.0–46.0)
Hemoglobin: 10.6 g/dL — ABNORMAL LOW (ref 12.0–15.0)
Immature Granulocytes: 0 %
Lymphocytes Relative: 25 %
Lymphs Abs: 1.8 10*3/uL (ref 0.7–4.0)
MCH: 28 pg (ref 26.0–34.0)
MCHC: 30 g/dL (ref 30.0–36.0)
MCV: 93.1 fL (ref 80.0–100.0)
Monocytes Absolute: 0.7 10*3/uL (ref 0.1–1.0)
Monocytes Relative: 10 %
Neutro Abs: 4.4 10*3/uL (ref 1.7–7.7)
Neutrophils Relative %: 62 %
Platelets: 233 10*3/uL (ref 150–400)
RBC: 3.79 MIL/uL — ABNORMAL LOW (ref 3.87–5.11)
RDW: 12.9 % (ref 11.5–15.5)
WBC: 7.1 10*3/uL (ref 4.0–10.5)
nRBC: 0 % (ref 0.0–0.2)

## 2020-07-12 LAB — CBG MONITORING, ED: Glucose-Capillary: 187 mg/dL — ABNORMAL HIGH (ref 70–99)

## 2020-07-12 LAB — MRSA PCR SCREENING: MRSA by PCR: NEGATIVE

## 2020-07-12 MED ORDER — CHLORHEXIDINE GLUCONATE CLOTH 2 % EX PADS
6.0000 | MEDICATED_PAD | Freq: Every day | CUTANEOUS | Status: DC
  Administered 2020-07-13: 6 via TOPICAL

## 2020-07-12 MED ORDER — AMLODIPINE BESYLATE 5 MG PO TABS
10.0000 mg | ORAL_TABLET | Freq: Every day | ORAL | Status: DC
  Administered 2020-07-12 – 2020-07-13 (×2): 10 mg via ORAL
  Filled 2020-07-12 (×2): qty 2

## 2020-07-12 MED ORDER — ALBUTEROL (5 MG/ML) CONTINUOUS INHALATION SOLN
10.0000 mg/h | INHALATION_SOLUTION | Freq: Once | RESPIRATORY_TRACT | Status: AC
  Administered 2020-07-12: 10 mg/h via RESPIRATORY_TRACT
  Filled 2020-07-12: qty 20

## 2020-07-12 MED ORDER — BENZONATATE 100 MG PO CAPS
100.0000 mg | ORAL_CAPSULE | Freq: Once | ORAL | Status: AC
  Administered 2020-07-12: 100 mg via ORAL
  Filled 2020-07-12: qty 1

## 2020-07-12 MED ORDER — METHYLPREDNISOLONE SODIUM SUCC 125 MG IJ SOLR
125.0000 mg | Freq: Once | INTRAMUSCULAR | Status: AC
  Administered 2020-07-12: 125 mg via INTRAVENOUS
  Filled 2020-07-12: qty 2

## 2020-07-12 MED ORDER — ALBUTEROL SULFATE HFA 108 (90 BASE) MCG/ACT IN AERS
4.0000 | INHALATION_SPRAY | Freq: Once | RESPIRATORY_TRACT | Status: AC
  Administered 2020-07-12: 4 via RESPIRATORY_TRACT
  Filled 2020-07-12: qty 6.7

## 2020-07-12 MED ORDER — IPRATROPIUM-ALBUTEROL 0.5-2.5 (3) MG/3ML IN SOLN
3.0000 mL | Freq: Four times a day (QID) | RESPIRATORY_TRACT | Status: DC
  Filled 2020-07-12: qty 3

## 2020-07-12 MED ORDER — POLYETHYLENE GLYCOL 3350 17 G PO PACK: 17.0000 g | PACK | Freq: Every day | ORAL | Status: DC | PRN

## 2020-07-12 MED ORDER — ACETAMINOPHEN 650 MG RE SUPP: 650.0000 mg | Freq: Four times a day (QID) | RECTAL | Status: DC | PRN

## 2020-07-12 MED ORDER — ONDANSETRON HCL 4 MG PO TABS: 4.0000 mg | ORAL_TABLET | Freq: Four times a day (QID) | ORAL | Status: DC | PRN

## 2020-07-12 MED ORDER — ENOXAPARIN SODIUM 60 MG/0.6ML ~~LOC~~ SOLN
60.0000 mg | SUBCUTANEOUS | Status: DC
  Administered 2020-07-12: 60 mg via SUBCUTANEOUS
  Filled 2020-07-12: qty 0.6

## 2020-07-12 MED ORDER — IPRATROPIUM BROMIDE 0.02 % IN SOLN
0.5000 mg | Freq: Once | RESPIRATORY_TRACT | Status: AC
  Administered 2020-07-12: 0.5 mg via RESPIRATORY_TRACT
  Filled 2020-07-12: qty 2.5

## 2020-07-12 MED ORDER — INSULIN ASPART 100 UNIT/ML ~~LOC~~ SOLN
0.0000 [IU] | Freq: Every day | SUBCUTANEOUS | Status: DC
  Administered 2020-07-12: 5 [IU] via SUBCUTANEOUS

## 2020-07-12 MED ORDER — ONDANSETRON HCL 4 MG/2ML IJ SOLN: 4.0000 mg | Freq: Four times a day (QID) | INTRAMUSCULAR | Status: DC | PRN

## 2020-07-12 MED ORDER — GUAIFENESIN-DM 100-10 MG/5ML PO SYRP
10.0000 mL | ORAL_SOLUTION | Freq: Three times a day (TID) | ORAL | Status: DC
  Administered 2020-07-12 – 2020-07-13 (×3): 10 mL via ORAL
  Filled 2020-07-12 (×3): qty 10

## 2020-07-12 MED ORDER — METOPROLOL TARTRATE 25 MG PO TABS
25.0000 mg | ORAL_TABLET | Freq: Two times a day (BID) | ORAL | Status: DC
  Administered 2020-07-12 – 2020-07-13 (×2): 25 mg via ORAL
  Filled 2020-07-12 (×2): qty 1

## 2020-07-12 MED ORDER — METHYLPREDNISOLONE SODIUM SUCC 125 MG IJ SOLR
80.0000 mg | Freq: Two times a day (BID) | INTRAMUSCULAR | Status: DC
  Administered 2020-07-13: 80 mg via INTRAVENOUS
  Filled 2020-07-12: qty 2

## 2020-07-12 MED ORDER — ACETAMINOPHEN 325 MG PO TABS
650.0000 mg | ORAL_TABLET | Freq: Four times a day (QID) | ORAL | Status: DC | PRN
  Administered 2020-07-12 – 2020-07-13 (×2): 650 mg via ORAL
  Filled 2020-07-12 (×2): qty 2

## 2020-07-12 MED ORDER — INSULIN ASPART 100 UNIT/ML ~~LOC~~ SOLN
0.0000 [IU] | Freq: Three times a day (TID) | SUBCUTANEOUS | Status: DC
  Administered 2020-07-13: 5 [IU] via SUBCUTANEOUS
  Administered 2020-07-13: 3 [IU] via SUBCUTANEOUS

## 2020-07-12 MED ORDER — IOHEXOL 300 MG/ML  SOLN
75.0000 mL | Freq: Once | INTRAMUSCULAR | Status: AC | PRN
  Administered 2020-07-12: 75 mL via INTRAVENOUS

## 2020-07-12 MED ORDER — IPRATROPIUM-ALBUTEROL 0.5-2.5 (3) MG/3ML IN SOLN
3.0000 mL | RESPIRATORY_TRACT | Status: DC | PRN
  Administered 2020-07-12: 3 mL via RESPIRATORY_TRACT

## 2020-07-12 MED ORDER — INSULIN GLARGINE 100 UNIT/ML ~~LOC~~ SOLN
30.0000 [IU] | Freq: Every day | SUBCUTANEOUS | Status: DC
  Administered 2020-07-12: 30 [IU] via SUBCUTANEOUS
  Filled 2020-07-12 (×2): qty 0.3

## 2020-07-12 NOTE — H&P (Signed)
History and Physical    BREN BORYS TSV:[id number] DOB: [birth year] DOA: 07/12/2020  PCP: Caren Macadam, MD   Patient coming from: Home  I have personally briefly reviewed patient's old medical records in [place]  Chief Complaint: Cough, Wheezing  HPI: Ann Roberson is a 70 y.o. female with medical history significant for sarcoidosis, angioedema, hypertension and diabetes, morbid obesity. Patient presented to the ED with complaints of cough, congestion and sore throat that started 4/11 6 days ago, the data she had her colonoscopy done.  Cough is productive of yellowish sputum.  She reports noisy breathing also.  No chest pain.  No leg swelling. Patient reports a history of sarcoidosis, diagnosed with skin biopsy, subsequently treated at some point with steroids for difficulty breathing with improvement, this was over 15 years ago.  She is currently not on treatment for her sarcoidosis, and she has not has subsequently not had any problems related to her sarcoidosis.   Never smoked cigarettes.  No history of asthma.  Received all 3 Covid vaccinations.  ED Course: Tachycardic to 120, respiratory rate 18- 34.  O2 sats greater than 90% on room air.  Due to concern for aspiration with recent colonoscopic, chest CT with contrast was obtained-no acute intrathoracic pathology, changes in keeping with pulmonary arterial hypertension, multiple thyroid nodules. 125 mg Solu-Medrol and continuous nebs given in ED.  With persistent tachycardia, tachypnea, and after ED treatment, patient's O2 sat dropped to 88% on room air with ambulation hospitalist was called to admit.   Review of Systems: As per HPI all other systems reviewed and negative.  Past Medical History:  Diagnosis Date  . Anemia   . Arthritis   . Diabetes mellitus   . GERD (gastroesophageal reflux disease)   . Heart murmur   . Hyperlipidemia   . Hypertension   . Neuropathy   . Sarcoidosis     Past Surgical  History:  Procedure Laterality Date  . BIOPSY  02/08/2018   Procedure: BIOPSY;  Surgeon: Daneil Dolin, MD;  [facility];  Service: Endoscopy;;  . CESAREAN SECTION    . CHOLECYSTECTOMY    . COLONOSCOPY WITH PROPOFOL N/A 07/06/2017   Dr. Gala Romney: Diverticulosis.five 8 to 16 mm polyps in the ascending colon and in the cecum, removed with hot snare.  Largest polyp could not be removed completely, status post inking.  Pathology revealed cecal tubular adenoma, a sending colon polyps with multiple fragments of tubular adenomas, tubulovillous adenoma, no high-grade dysplasia or malignancy.  Tubular adenomas removed from the rectum as   . COLONOSCOPY WITH PROPOFOL N/A 02/08/2018   12 mm tubular adenoma removed from the ileocecal valve, hyperplastic sigmoid colon polyp removed.  Possible residual polypoid tissue at the ascending colon segment previously tattooed, biopsied (tubular adenoma) and ablated with APC.  Recommend 2-year surveillance study.  . COLONOSCOPY WITH PROPOFOL N/A 07/06/2020   Procedure: COLONOSCOPY WITH PROPOFOL;  Surgeon: Daneil Dolin, MD;  [facility];  Service: Endoscopy;  Laterality: N/A;  am appt, diabetic. general anesthesia; intubation to control respirations. Add extra 15 min - needs to be 1st case per [facility]  . MASTECTOMY, PARTIAL Left    benign  . POLYPECTOMY  07/06/2017   Procedure: POLYPECTOMY;  Surgeon: Daneil Dolin, MD;  [facility];  Service: Endoscopy;;  cecal polyp cs, ascending colon polyp hs  . POLYPECTOMY  02/08/2018   Procedure: POLYPECTOMY;  Surgeon: Daneil Dolin, MD;  [facility];  Service: Endoscopy;;  . POLYPECTOMY  07/06/2020   Procedure: POLYPECTOMY INTESTINAL;  Surgeon: Daneil Dolin, MD;  [facility];  Service: Endoscopy;;  . TUBAL LIGATION       reports that she has never smoked. She has never used smokeless tobacco. She reports that she does not drink alcohol and does not use  drugs.  Allergies  Allergen Reactions  . Erythromycin Diarrhea and Nausea And Vomiting    Family History  Problem Relation Age of Onset  . Asthma Brother   . Cancer Mother        stomach  . Cancer Father        prostate  . Cancer Brother        ?  . Diabetes Brother   . Allergic rhinitis Neg Hx   . Angioedema Neg Hx   . Atopy Neg Hx   . Eczema Neg Hx   . Immunodeficiency Neg Hx   . Urticaria Neg Hx   . Colon cancer Neg Hx     Prior to Admission medications   Medication Sig Start Date End Date Taking? Authorizing Provider  acetaminophen (TYLENOL) 650 MG CR tablet Take 1,300 mg by mouth 2 (two) times daily.    [provider]  amLODipine (NORVASC) 10 MG tablet Take 10 mg by mouth in the morning.    [provider]  aspirin EC 81 MG tablet Take 81 mg by mouth in the morning. Swallow whole.    [provider]  atorvastatin (LIPITOR) 20 MG tablet Take 1 tablet by mouth daily. 06/19/20   [provider]  B-D UF III MINI PEN NEEDLES 31G X 5 MM MISC USE WITH LANTUS PEN 09/26/17   Caren Macadam, MD  Cyanocobalamin (VITAMIN B-12) 2500 MCG SUBL Place 2,500 mcg under the tongue in the morning.    [provider]  diclofenac Sodium (VOLTAREN) 1 % GEL Apply 1 application topically 2 (two) times daily as needed. 04/02/20   [provider]  DULoxetine (CYMBALTA) 60 MG capsule Take 60 mg by mouth at bedtime. 10/05/18   [provider]  gabapentin (NEURONTIN) 300 MG capsule TAKE 2 CAPSULES BY MOUTH THREE TIMES DAILY Patient taking differently: Take 600 mg by mouth 3 (three) times daily. 11/24/17   Caren Macadam, MD  hydrochlorothiazide (HYDRODIURIL) 25 MG tablet Take 25 mg by mouth every other day. In the morning 07/27/16   [provider]  LANTUS SOLOSTAR 100 UNIT/ML Solostar Pen Inject 50 Units into the skin daily at 10 pm. 12/31/19   [provider]  losartan (COZAAR) 100 MG tablet TAKE 1 TABLET BY MOUTH ONCE  DAILY Patient taking differently: Take 100 mg by mouth in the morning. 12/11/17   Caren Macadam, MD  metFORMIN (GLUCOPHAGE) 1000 MG tablet TAKE 1 TABLET BY MOUTH TWICE DAILY Patient taking differently: Take 1,000 mg by mouth in the morning and at bedtime. 12/11/17   Caren Macadam, MD  metoprolol tartrate (LOPRESSOR) 25 MG tablet TAKE 1 TABLET BY MOUTH TWICE DAILY Patient taking differently: Take 25 mg by mouth 2 (two) times daily. 01/13/20   Isaiah Serge, NP  nitroGLYCERIN (NITROSTAT) 0.4 MG SL tablet Place 1 tablet (0.4 mg total) under the tongue every 5 (five) minutes as needed. Patient taking differently: Place 0.4 mg under the tongue every 5 (five) minutes x 3 doses as needed for chest pain. 08/29/17   Herminio Commons, MD  OZEMPIC, 0.25 OR 0.5 MG/DOSE, 2 MG/1.5ML SOPN Inject 0.5 mg into  the skin every Sunday. 10/21/19   [provider]  polyethylene glycol-electrolytes (NULYTELY) 420 g solution As directed 05/18/20   Rourk, Cristopher Estimable, MD  potassium chloride SA (K-DUR,KLOR-CON) 20 MEQ tablet Take 1 tablet (20 mEq total) by mouth daily. Patient taking differently: Take 20 mEq by mouth every other day. In the morning 08/30/17   Caren Macadam, MD    Physical Exam: Vitals:   07/12/20 1430 07/12/20 1530 07/12/20 1630 07/12/20 1755  BP: (!) 143/63 (!) 152/70 (!) 160/66   Pulse: (!) 116 (!) 111 (!) 111   Resp: (!) 34 (!) 25 (!) 29   Temp:      TempSrc:      SpO2: 95% 92% 92%   Weight:    120.3 kg  Height:    5\' 4"  (1.626 m)    Constitutional: NAD, obesity Vitals:   07/12/20 1430 07/12/20 1530 07/12/20 1630 07/12/20 1755  BP: (!) 143/63 (!) 152/70 (!) 160/66   Pulse: (!) 116 (!) 111 (!) 111   Resp: (!) 34 (!) 25 (!) 29   Temp:      TempSrc:      SpO2: 95% 92% 92%   Weight:    120.3 kg  Height:    5\' 4"  (1.626 m)   Eyes: PERRL, lids and conjunctivae normal ENMT: Mucous membranes are moist. Neck: normal, supple, no masses, no thyromegaly Respiratory: Diffuse inspiratory  and expiratory rhonchi, mild increased work of breathing with long sentences. Cardiovascular: Tachycardic, regular rate and rhythm,  No extremity edema. 2+ pedal pulses. No carotid bruits.  Abdomen: no tenderness, no masses palpated. No hepatosplenomegaly. Bowel sounds positive.  Musculoskeletal: no clubbing / cyanosis. No joint deformity upper and lower extremities. Good ROM, no contractures. Normal muscle tone.  Skin: no rashes, lesions, ulcers. No induration Neurologic: No apparent cranial abnormality, moving extremities spontaneously. Psychiatric: Normal judgment and insight. Alert and oriented x 3. Normal mood.   Labs on Admission: I have personally reviewed following labs and imaging studies  CBC: Recent Labs  Lab 07/12/20 1056  WBC 7.1  NEUTROABS 4.4  HGB 10.6*  HCT 35.3*  MCV 93.1  PLT 784   Basic Metabolic Panel: Recent Labs  Lab 07/12/20 1056  NA 138  K 4.6  CL 108  CO2 22  GLUCOSE 197*  BUN 20  CREATININE 1.20*  CALCIUM 8.6*   CBG: Recent Labs  Lab 07/06/20 0632 07/06/20 0820 07/12/20 1522  GLUCAP 147* 125* 187*    Radiological Exams on Admission: CT Chest W Contrast  Result Date: 07/12/2020 CLINICAL DATA:  Respiratory illness, productive cough, chest congestion EXAM: CT CHEST WITH CONTRAST TECHNIQUE: Multidetector CT imaging of the chest was performed during intravenous contrast administration. CONTRAST:  62mL OMNIPAQUE IOHEXOL 300 MG/ML  SOLN COMPARISON:  None. FINDINGS: Cardiovascular: At least mild coronary artery calcification. Global cardiac size is within normal limits. No pericardial effusion. The central pulmonary arteries are enlarged in keeping with changes of pulmonary arterial hypertension. Mild atherosclerotic calcification within the thoracic aorta. No thoracic aortic aneurysm. 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 Mediastinum/Nodes: Multiple nodules are seen within the thyroid isthmus and left thyroid lobe  measuring up to 16 mm, indeterminate. Multiple calcified mediastinal lymph nodes are seen in keeping with changes of old granulomatous disease. No pathologic thoracic adenopathy. The esophagus is unremarkable. Lungs/Pleura: Lungs are clear. No pleural effusion or pneumothorax. The central airways are widely patent. Upper Abdomen: At least mild hepatic steatosis. No acute abnormality  within the visualized upper abdomen. Musculoskeletal: No acute bone abnormality. No lytic or blastic bone lesions. IMPRESSION: No acute intrathoracic pathology. Mild coronary artery calcification. Morphologic changes in keeping with pulmonary arterial hypertension. Duplicated superior vena cava, a normal anatomic variant Multiple thyroid nodules measuring up to 16 mm. Recommend thyroid US (ref: J Am Coll Radiol. 2015 Feb;12(2): 143-50). Aortic Atherosclerosis (ICD10-I70.0). Electronically Signed   By: Fidela Salisbury MD   On: 07/12/2020 15:24   DG Chest Port 1 View  Result Date: 07/12/2020 CLINICAL DATA:  Productive cough and sore throat. EXAM: PORTABLE CHEST 1 VIEW COMPARISON:  Chest x-ray dated 06/29/2020 FINDINGS: Heart size and mediastinal contours are within normal limits. Lungs are clear. No pleural effusion or pneumothorax is seen. Osseous structures about the chest are unremarkable. IMPRESSION: No active disease. No evidence of pneumonia or pulmonary edema. Electronically Signed   By: Franki Cabot M.D.   On: 07/12/2020 09:19    EKG: Independently reviewed.  Sinus rhythm rate 88, QTc 415.  Assessment/Plan Principal Problem:   Acute respiratory failure with hypoxia (HCC) Active Problems:   Hypertension associated with diabetes ([place])   Morbid obesity due to excess calories ([place])   Multiple thyroid nodules  Acute respiratory failure with hypoxia- likely bronchitis . Possible sarcoidosis flare but her chest Ct is without acute intrathoracic abnormality at this time.  O2 sats 88% on room air with ambulation, with  tachycardia and tachypnea.  Diffuse rhonchi on auscultation.  No history of asthma.  History of sarcoidosis not on treatment.  Covid and influenza test negative.  -Nebs as needed and scheduled -Mucolytic's -125mg  Solu-Medrol given, continue 80 twice daily -Pending clinical course, consider pulmonology consult if no improvement.  Multiple thyroid nodules test.  Incidental finding on chest CT. -will obtain thyroid ultrasound is recommended. - Obtain TSH, Free T3, T4.  HTN-systolic 827M to 786L. -Resume Norvasc, metoprolol -Hold HCTZ and losartan with contrast exposure  Diabetes-random glucose 197 - SSI- M - HgbA1c -Hold Metformin - Resume home lantus 50u daily  Morbid obesity-BMI 45.   DVT prophylaxis: Lovenox Code Status: Full code Family Communication: None at bedside Disposition Plan: ~ 1 - 2 days Consults called: None Admission status: Obs tele.   Bethena Roys MD [facility]  07/12/2020, 6:27 PM

## 2020-07-12 NOTE — ED Notes (Signed)
RA sat 88% with ambulation

## 2020-07-12 NOTE — ED Triage Notes (Signed)
Pt presents to ED with complaints of productive cough, congestion and sore throat since Monday. Unsure of fever.

## 2020-07-12 NOTE — ED Provider Notes (Signed)
[facility] Provider Note   CSN: [id number] Arrival date & time: 07/12/20  0818     History Chief Complaint  Patient presents with  . Cough    Ann Roberson is a 70 y.o. female with PMhx HTN, HLD, GERD, Diabetes who presents to the ED today with complaint of gradual onset, constant, productive cough for the past 3-4 days. Pt also complains of chills, sore throat, body aches, fatigue, rhinorrhea, and chest congestion. She denies any recent sick contact however feels like she began feeling sick after she had a colonoscopy done on 04/11. She has been taking OTC medications without relief. She does not believe she has had a fever. She is vaccinated x 3. No other complaints at this time.   The history is provided by the patient and medical records.       Past Medical History:  Diagnosis Date  . Anemia   . Arthritis   . Diabetes mellitus   . GERD (gastroesophageal reflux disease)   . Heart murmur   . Hyperlipidemia   . Hypertension   . Neuropathy   . Sarcoidosis     Patient Active Problem List   Diagnosis Date Noted  . Acute respiratory failure with hypoxia ([place]) 07/12/2020  . H/O adenomatous polyp of colon 05/18/2020  . Bilateral primary osteoarthritis of knee 04/08/2020  . Nausea vomiting and diarrhea 10/24/2018  . COVID-19 virus infection 10/23/2018  . DOE (dyspnea on exertion) 10/23/2018  . Tubulovillous adenoma of colon 01/09/2018  . Excessive ear wax 06/25/2017  . Knee osteoarthritis 06/17/2017  . Anemia in other chronic diseases classified elsewhere 06/17/2017  . Type 2 diabetes mellitus with other specified complication ([place]) 56/31/4970  . Upper airway cough syndrome 05/02/2017  . Morbid obesity due to excess calories ([place]) 05/02/2017  . Dyspnea on exertion 05/01/2017  . Hypertension associated with diabetes ([place]) 04/06/2017  . Hyperlipidemia associated with type 2 diabetes mellitus ([place]) 04/06/2017  . Urticaria 11/17/2015  . Angioedema  11/17/2015  . Pain in joint, lower leg 12/10/2013    Past Surgical History:  Procedure Laterality Date  . BIOPSY  02/08/2018   Procedure: BIOPSY;  Surgeon: Daneil Dolin, MD;  [facility];  Service: Endoscopy;;  . CESAREAN SECTION    . CHOLECYSTECTOMY    . COLONOSCOPY WITH PROPOFOL N/A 07/06/2017   Dr. Gala Romney: Diverticulosis.five 8 to 16 mm polyps in the ascending colon and in the cecum, removed with hot snare.  Largest polyp could not be removed completely, status post inking.  Pathology revealed cecal tubular adenoma, a sending colon polyps with multiple fragments of tubular adenomas, tubulovillous adenoma, no high-grade dysplasia or malignancy.  Tubular adenomas removed from the rectum as   . COLONOSCOPY WITH PROPOFOL N/A 02/08/2018   12 mm tubular adenoma removed from the ileocecal valve, hyperplastic sigmoid colon polyp removed.  Possible residual polypoid tissue at the ascending colon segment previously tattooed, biopsied (tubular adenoma) and ablated with APC.  Recommend 2-year surveillance study.  . COLONOSCOPY WITH PROPOFOL N/A 07/06/2020   Procedure: COLONOSCOPY WITH PROPOFOL;  Surgeon: Daneil Dolin, MD;  Location: [facility];  Service: Endoscopy;  Laterality: N/A;  am appt, diabetic. general anesthesia; intubation to control respirations. Add extra 15 min - needs to be 1st case per [facility]  . MASTECTOMY, PARTIAL Left    benign  . POLYPECTOMY  07/06/2017   Procedure: POLYPECTOMY;  Surgeon: Daneil Dolin, MD;  [facility];  Service: Endoscopy;;  cecal  polyp cs, ascending colon polyp hs  . POLYPECTOMY  02/08/2018   Procedure: POLYPECTOMY;  Surgeon: Daneil Dolin, MD;  [facility];  Service: Endoscopy;;  . POLYPECTOMY  07/06/2020   Procedure: POLYPECTOMY INTESTINAL;  Surgeon: Daneil Dolin, MD;  [facility];  Service: Endoscopy;;  . TUBAL LIGATION       OB History    Gravida  1   Para  1   Term  1   Preterm      AB       Living  1     SAB      IAB      Ectopic      Multiple      Live Births              Family History  Problem Relation Age of Onset  . Asthma Brother   . Cancer Mother        stomach  . Cancer Father        prostate  . Cancer Brother        ?  . Diabetes Brother   . Allergic rhinitis Neg Hx   . Angioedema Neg Hx   . Atopy Neg Hx   . Eczema Neg Hx   . Immunodeficiency Neg Hx   . Urticaria Neg Hx   . Colon cancer Neg Hx     Social History   Tobacco Use  . Smoking status: Never Smoker  . Smokeless tobacco: Never Used  Vaping Use  . Vaping Use: Never used  Substance Use Topics  . Alcohol use: No  . Drug use: No    Home Medications Prior to Admission medications   Medication Sig Start Date End Date Taking? Authorizing Provider  acetaminophen (TYLENOL) 650 MG CR tablet Take 1,300 mg by mouth 2 (two) times daily.    [provider]  amLODipine (NORVASC) 10 MG tablet Take 10 mg by mouth in the morning.    [provider]  aspirin EC 81 MG tablet Take 81 mg by mouth in the morning. Swallow whole.    [provider]  atorvastatin (LIPITOR) 20 MG tablet Take 1 tablet by mouth daily. 06/19/20   [provider]  B-D UF III MINI PEN NEEDLES 31G X 5 MM MISC USE WITH LANTUS PEN 09/26/17   Caren Macadam, MD  Cyanocobalamin (VITAMIN B-12) 2500 MCG SUBL Place 2,500 mcg under the tongue in the morning.    [provider]  diclofenac Sodium (VOLTAREN) 1 % GEL Apply 1 application topically 2 (two) times daily as needed. 04/02/20   [provider]  DULoxetine (CYMBALTA) 60 MG capsule Take 60 mg by mouth at bedtime. 10/05/18   [provider]  gabapentin (NEURONTIN) 300 MG capsule TAKE 2 CAPSULES BY MOUTH THREE TIMES DAILY Patient taking differently: Take 600 mg by mouth 3 (three) times daily. 11/24/17   Caren Macadam, MD  hydrochlorothiazide (HYDRODIURIL) 25 MG tablet Take 25 mg by mouth every other day. In the morning  07/27/16   [provider]  LANTUS SOLOSTAR 100 UNIT/ML Solostar Pen Inject 50 Units into the skin daily at 10 pm. 12/31/19   [provider]  losartan (COZAAR) 100 MG tablet TAKE 1 TABLET BY MOUTH ONCE DAILY Patient taking differently: Take 100 mg by mouth in the morning. 12/11/17   Caren Macadam, MD  metFORMIN (GLUCOPHAGE) 1000 MG tablet TAKE 1 TABLET BY MOUTH TWICE DAILY Patient taking differently: Take 1,000 mg  by mouth in the morning and at bedtime. 12/11/17   Caren Macadam, MD  metoprolol tartrate (LOPRESSOR) 25 MG tablet TAKE 1 TABLET BY MOUTH TWICE DAILY Patient taking differently: Take 25 mg by mouth 2 (two) times daily. 01/13/20   Isaiah Serge, NP  nitroGLYCERIN (NITROSTAT) 0.4 MG SL tablet Place 1 tablet (0.4 mg total) under the tongue every 5 (five) minutes as needed. Patient taking differently: Place 0.4 mg under the tongue every 5 (five) minutes x 3 doses as needed for chest pain. 08/29/17   Herminio Commons, MD  OZEMPIC, 0.25 OR 0.5 MG/DOSE, 2 MG/1.5ML SOPN Inject 0.5 mg into the skin every Sunday. 10/21/19   [provider]  polyethylene glycol-electrolytes (NULYTELY) 420 g solution As directed 05/18/20   Rourk, Cristopher Estimable, MD  potassium chloride SA (K-DUR,KLOR-CON) 20 MEQ tablet Take 1 tablet (20 mEq total) by mouth daily. Patient taking differently: Take 20 mEq by mouth every other day. In the morning 08/30/17   Caren Macadam, MD    Allergies    Erythromycin  Review of Systems   Review of Systems  Constitutional: Positive for chills and fatigue. Negative for fever.  HENT: Positive for congestion, rhinorrhea and sore throat. Negative for sneezing, trouble swallowing and voice change.   Respiratory: Positive for cough and shortness of breath.   Musculoskeletal: Positive for myalgias.  All other systems reviewed and are negative.   Physical Exam Updated Vital Signs BP 127/68 (BP Location: Left Arm)   Pulse 89   Temp 98 F (36.7 C) (Oral)   Resp  18   Ht 5\' 4"  (1.626 m)   Wt 123.4 kg   SpO2 95%   BMI 46.69 kg/m   Physical Exam Vitals and nursing note reviewed.  Constitutional:      Appearance: She is not ill-appearing or diaphoretic.  HENT:     Head: Normocephalic and atraumatic.  Eyes:     Conjunctiva/sclera: Conjunctivae normal.  Cardiovascular:     Rate and Rhythm: Normal rate and regular rhythm.     Pulses: Normal pulses.  Pulmonary:     Effort: Tachypnea present.     Breath sounds: Wheezing and rhonchi present.  Abdominal:     Palpations: Abdomen is soft.     Tenderness: There is no abdominal tenderness.  Musculoskeletal:     Cervical back: Neck supple.  Skin:    General: Skin is warm and dry.  Neurological:     Mental Status: She is alert.     ED Results / Procedures / Treatments   Labs (all labs ordered are listed, but only abnormal results are displayed) Labs Reviewed  BASIC METABOLIC PANEL - Abnormal; Notable for the following components:      Result Value   Glucose, Bld 197 (*)    Creatinine, Ser 1.20 (*)    Calcium 8.6 (*)    GFR, Estimated 50 (*)    All other components within normal limits  CBC WITH DIFFERENTIAL/PLATELET - Abnormal; Notable for the following components:   RBC 3.79 (*)    Hemoglobin 10.6 (*)    HCT 35.3 (*)    All other components within normal limits  CBG MONITORING, ED - Abnormal; Notable for the following components:   Glucose-Capillary 187 (*)    All other components within normal limits  RESP PANEL BY RT-PCR (FLU A&B, COVID) [id number]    EKG EKG Interpretation  Date/Time:  Sunday July 12 2020 09:23:43 EDT Ventricular Rate:  88 PR Interval:  146 QRS  Duration: 83 QT Interval:  343 QTC Calculation: 415 R Axis:   41 Text Interpretation: Sinus rhythm Probable left atrial enlargement Posterior infarct, old No acute changes No significant change since last tracing Confirmed by Varney Biles [phone]) on 07/12/2020 10:32:21 AM   Radiology CT Chest W Contrast  Result  Date: 07/12/2020 CLINICAL DATA:  Respiratory illness, productive cough, chest congestion EXAM: CT CHEST WITH CONTRAST TECHNIQUE: Multidetector CT imaging of the chest was performed during intravenous contrast administration. CONTRAST:  40mL OMNIPAQUE IOHEXOL 300 MG/ML  SOLN COMPARISON:  None. FINDINGS: Cardiovascular: At least mild coronary artery calcification. Global cardiac size is within normal limits. No pericardial effusion. The central pulmonary arteries are enlarged in keeping with changes of pulmonary arterial hypertension. Mild atherosclerotic calcification within the thoracic aorta. No thoracic aortic aneurysm. 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 Mediastinum/Nodes: Multiple nodules are seen within the thyroid isthmus and left thyroid lobe measuring up to 16 mm, indeterminate. Multiple calcified mediastinal lymph nodes are seen in keeping with changes of old granulomatous disease. No pathologic thoracic adenopathy. The esophagus is unremarkable. Lungs/Pleura: Lungs are clear. No pleural effusion or pneumothorax. The central airways are widely patent. Upper Abdomen: At least mild hepatic steatosis. No acute abnormality within the visualized upper abdomen. Musculoskeletal: No acute bone abnormality. No lytic or blastic bone lesions. IMPRESSION: No acute intrathoracic pathology. Mild coronary artery calcification. Morphologic changes in keeping with pulmonary arterial hypertension. Duplicated superior vena cava, a normal anatomic variant Multiple thyroid nodules measuring up to 16 mm. Recommend thyroid US (ref: J Am Coll Radiol. 2015 Feb;12(2): 143-50). Aortic Atherosclerosis (ICD10-I70.0). Electronically Signed   By: Fidela Salisbury MD   On: 07/12/2020 15:24   DG Chest Port 1 View  Result Date: 07/12/2020 CLINICAL DATA:  Productive cough and sore throat. EXAM: PORTABLE CHEST 1 VIEW COMPARISON:  Chest x-ray dated 06/29/2020 FINDINGS: Heart size and mediastinal  contours are within normal limits. Lungs are clear. No pleural effusion or pneumothorax is seen. Osseous structures about the chest are unremarkable. IMPRESSION: No active disease. No evidence of pneumonia or pulmonary edema. Electronically Signed   By: Franki Cabot M.D.   On: 07/12/2020 09:19    Procedures Procedures   Medications Ordered in ED Medications  albuterol (VENTOLIN HFA) 108 (90 Base) MCG/ACT inhaler 4 puff (4 puffs Inhalation Given 07/12/20 0930)  benzonatate (TESSALON) capsule 100 mg (100 mg Oral Given 07/12/20 1034)  albuterol (PROVENTIL,VENTOLIN) solution continuous neb (10 mg/hr Nebulization Given 07/12/20 1118)  ipratropium (ATROVENT) nebulizer solution 0.5 mg (0.5 mg Nebulization Given 07/12/20 1118)  methylPREDNISolone sodium succinate (SOLU-MEDROL) 125 mg/2 mL injection 125 mg (125 mg Intravenous Given 07/12/20 1342)  iohexol (OMNIPAQUE) 300 MG/ML solution 75 mL (75 mLs Intravenous Contrast Given 07/12/20 1357)    ED Course  I have reviewed the triage vital signs and the nursing notes.  Pertinent labs & imaging results that were available during my care of the patient were reviewed by me and considered in my medical decision making (see chart for details).    MDM Rules/Calculators/A&P                          70 year old female who presents to the ED today with complaint of URI-like symptoms including cough, congestion, rhinorrhea, body aches, sore throat for the past 3 to 4 days.  She does not believe she had a fever.  Feels like her symptoms started after she had a colonoscopy  done on 4/11.  Having no abdominal pain, nausea, vomiting, constipation.  On arrival to the ED her vitals are stable.  Patient is afebrile, nontachycardic and nontachypneic initially however when I enter the room she does have slight tachypnea with respirations at 22.  Satting between 92 to 95% on room air.  She is speaking in full sentences between coughing spells.  She has diffuse wheezing and  rhonchorous breath sounds throughout.  Chest x-ray is pending at this time.  A Covid and flu swab is also been ordered which I feel is appropriate.  Will obtain EKG at this time as patient does feel she is short of breath and provide albuterol inhaler.  She does have a history of diabetes with most recent A1c at 7.1.  No history of asthma or COPD and patient is a never smoker. She does have a hx of sarcoidosis however is not currently on meds for several years for same.   CXR clear without signs of pneumonia or other abnormalities COVID negative  Pt continued to have no improvement after 4 puffs albuterol and tessalon perles. Once COVID returned negative she received a 1 hour duoneb breathing treatment without much improvement. Labwork ordered without any abnormalities. CBC without a leukocytosis and hgb stable at 10.6. BMP with glucose 197, creatinine 1.20, and GFR 50. No other electrolyte abnormalities. Case discussed with attending physician Dr. Tammy Sours given no obvious improvement in symptoms - CT chest with contrast ordered to look for possible aspiration pneumonia given symptoms started after colonoscopy.   CT chest: IMPRESSION:  No acute intrathoracic pathology.    Mild coronary artery calcification.    Morphologic changes in keeping with pulmonary arterial hypertension.    Duplicated superior vena cava, a normal anatomic variant    Multiple thyroid nodules measuring up to 16 mm. Recommend thyroid US  (ref: J Am Coll Radiol. 2015 Feb;12(2): 143-50).    Aortic Atherosclerosis (ICD10-I70.0).   Pt was ambulated after multiple medications and O2 sats dropped to 88%. Per nursing staff it took about 5 minutes for it to go back up to baseline. I do feel pt requires admission at this time given hardly any improvement in her symptoms. Question sarcoidosis flare at this time. May benefit from pulmonology eval while in the ED.   Discussed case with [facility] Dr. Denton Brick who agrees to  evaluate patient for admission. Appreciate her involvement.   This note was prepared using Dragon voice recognition software and may include unintentional dictation errors due to the inherent limitations of voice recognition software.   Final Clinical Impression(s) / ED Diagnoses Final diagnoses:  Cough  Wheezing  SOB (shortness of breath)    Rx / DC Orders ED Discharge Orders    None       Eustaquio Maize, PA-C 07/12/20 [place], Ankit, MD 07/13/20 1037

## 2020-07-13 ENCOUNTER — Observation Stay (HOSPITAL_COMMUNITY): Payer: Medicare Other

## 2020-07-13 DIAGNOSIS — E1169 Type 2 diabetes mellitus with other specified complication: Secondary | ICD-10-CM

## 2020-07-13 DIAGNOSIS — E1159 Type 2 diabetes mellitus with other circulatory complications: Secondary | ICD-10-CM

## 2020-07-13 DIAGNOSIS — R062 Wheezing: Secondary | ICD-10-CM

## 2020-07-13 DIAGNOSIS — Z794 Long term (current) use of insulin: Secondary | ICD-10-CM

## 2020-07-13 DIAGNOSIS — I152 Hypertension secondary to endocrine disorders: Secondary | ICD-10-CM

## 2020-07-13 DIAGNOSIS — E042 Nontoxic multinodular goiter: Secondary | ICD-10-CM | POA: Diagnosis not present

## 2020-07-13 DIAGNOSIS — J9601 Acute respiratory failure with hypoxia: Secondary | ICD-10-CM

## 2020-07-13 LAB — GLUCOSE, CAPILLARY
Glucose-Capillary: 182 mg/dL — ABNORMAL HIGH (ref 70–99)
Glucose-Capillary: 228 mg/dL — ABNORMAL HIGH (ref 70–99)

## 2020-07-13 LAB — TSH: TSH: 0.149 u[IU]/mL — ABNORMAL LOW (ref 0.350–4.500)

## 2020-07-13 LAB — HIV ANTIBODY (ROUTINE TESTING W REFLEX): HIV Screen 4th Generation wRfx: NONREACTIVE

## 2020-07-13 MED ORDER — PANTOPRAZOLE SODIUM 40 MG PO TBEC: 40.0000 mg | DELAYED_RELEASE_TABLET | Freq: Every day | ORAL | 1 refills | Status: DC

## 2020-07-13 MED ORDER — POTASSIUM CHLORIDE CRYS ER 20 MEQ PO TBCR: 20.0000 meq | EXTENDED_RELEASE_TABLET | ORAL | Status: AC

## 2020-07-13 MED ORDER — PREDNISONE 20 MG PO TABS: ORAL_TABLET | ORAL | 0 refills | Status: DC

## 2020-07-13 MED ORDER — GUAIFENESIN-DM 100-10 MG/5ML PO SYRP: 10.0000 mL | ORAL_SOLUTION | Freq: Three times a day (TID) | ORAL | 0 refills | Status: AC

## 2020-07-13 MED ORDER — ALBUTEROL SULFATE HFA 108 (90 BASE) MCG/ACT IN AERS: 2.0000 | INHALATION_SPRAY | Freq: Four times a day (QID) | RESPIRATORY_TRACT | 2 refills | Status: AC | PRN

## 2020-07-13 NOTE — Progress Notes (Signed)
SATURATION QUALIFICATIONS: (This note is used to comply with regulatory documentation for home oxygen)  Patient Saturations on Room Air at Rest = 98%  Patient Saturations on Room Air while Ambulating = 96%  Patient Saturations on 0 Liters of oxygen while Ambulating = N/A%  Please briefly explain why patient needs home oxygen: Patient does not need at home oxygen.

## 2020-07-13 NOTE — Progress Notes (Signed)
Patient given discharge instructions. Patient stated understanding and had no questions. Patient taken out by wheelchair. Patient discharged to private residency by car.

## 2020-07-13 NOTE — Discharge Summary (Signed)
Physician Discharge Summary  Ann Roberson CXK:[id number] DOB: 05/01/1950 DOA: 07/12/2020  PCP: Caren Macadam, MD  Admit date: 07/12/2020 Discharge date: 07/13/2020  Time spent: 35 minutes  Recommendations for Outpatient Follow-up:  1. Arrange outpatient follow-up with endocrinology service 2. Follow final results of free T3 and T4 3. Continue to assist patient will need obesity management and if needed make referral to bariatric clinic. 4. Repeat basic metabolic panel to follow-up lites and renal function.   Discharge Diagnoses:  Principal Problem:   Acute respiratory failure with hypoxia (HCC) Active Problems:   Hypertension associated with diabetes ([place])   Obesity, Class III, BMI 40-49.9 (morbid obesity) ([place])   Multiple thyroid nodules   Wheezing   Discharge Condition: Stable and improved.  No requiring oxygen supplementation at discharge.  Instructed to follow-up with PCP in the next 10 days.  CODE STATUS: Full code.  Diet recommendation: Modified carbohydrates, heart healthy and low calorie diet.  Filed Weights   07/12/20 0829 07/12/20 1755  Weight: 123.4 kg 120.3 kg    History of present illness:  As per H&P written by Dr. Denton Brick on 07/13/2018 Ann Roberson is a 69 y.o. female with medical history significant for sarcoidosis, angioedema, hypertension and diabetes, morbid obesity. Patient presented to the ED with complaints of cough, congestion and sore throat that started 4/11 6 days ago, the data she had her colonoscopy done.  Cough is productive of yellowish sputum.  She reports noisy breathing also.  No chest pain.  No leg swelling. Patient reports a history of sarcoidosis, diagnosed with skin biopsy, subsequently treated at some point with steroids for difficulty breathing with improvement, this was over 15 years ago.  She is currently not on treatment for her sarcoidosis, and she has not has subsequently not had any problems related to her sarcoidosis.    Never smoked cigarettes.  No history of asthma.  Received all 3 Covid vaccinations.  ED Course: Tachycardic to 120, respiratory rate 18- 34.  O2 sats greater than 90% on room air.  Due to concern for aspiration with recent colonoscopic, chest CT with contrast was obtained-no acute intrathoracic pathology, changes in keeping with pulmonary arterial hypertension, multiple thyroid nodules. 125 mg Solu-Medrol and continuous nebs given in ED.  With persistent tachycardia, tachypnea, and after ED treatment, patient's O2 sat dropped to 88% on room air with ambulation hospitalist was called to admit.   Hospital Course:  1-acute respiratory failure with hypoxia -In the setting of mild sarcoidosis flare. -No fever no frank infiltrates appreciated on CT scan -At time of admission 2 L nasal cannula supplementation were provided as the patient was 88% on room air. -Rapid improvement with steroid usage -Concerns for obesity hypoventilation syndrome along with thiamine reflux explanation -Patient has been discharge on the steroids tapering, PPI and instructions to follow-up with PCP as an outpatient. -As needed bronchodilators and antitussive/mucolytic's medications were prescribed. -No oxygen requirement appreciated during desaturation screening at time of discharge.  2-morbid obesity -Body mass index is 45.52 kg/m. -Low calorie diet, portion control and increase physical activity discussed with patient -Patient will benefit of outpatient referral to bariatric clinic.  3-type 2 diabetes mellitus -Modified carbohydrate diet has been encouraged -Resume home hypoglycemic regimen  4-gastroesophageal flux disease -As mentioned above patient has been discharged on PPI. -Protonix will also help for GI prophylaxis while actively receiving treatment with a steroid tapering.  5-hypertension -Resume home antihypertensive agent -Heart healthy diet has been encouraged.  6-diabetic neuropathy -Resume the  use  of Neurontin.  7-multiple thyroid nodules incidentally found on CT -Following radiology recommendations thyroid ultrasound has been completed -TSH 0.149 -T3 and T4 pending at discharge -Patient will benefit of outpatient follow-up with endocrinology service.  Procedures:  See below for x-ray reports  Consultations:  None   Discharge Exam: Vitals:   07/13/20 1039 07/13/20 1120  BP:    Pulse: (!) 101 97  Resp:  (!) 26  Temp:  97.6 F (36.4 C)  SpO2:  97%    General: No fever, no chest pain, no nausea, no vomiting.  Patient reports improvement in her breathing and is currently requiring oxygen supplementation. Cardiovascular: S1-S2, no rubs, no gallops, unable to properly assess JVD with body habitus. Respiratory: Improved air movement bilaterally; positive rhonchi.  Very little expiratory wheezing on examination; no using accessory muscles.  Normal respiratory efforts -Abdomen: Obese, soft, nontender, distended, positive bowel sounds Extremities: No cyanosis or clubbing.  Discharge Instructions   Discharge Instructions    Diet - low sodium heart healthy   Complete by: As directed    Diet Carb Modified   Complete by: As directed    Discharge instructions   Complete by: As directed    Take medications as prescribed Maintain adequate hydration  Follow heart healthy, modified carbohydrates and low calorie diet. Directed follow-up with PCP in 10 days     Allergies as of 07/13/2020      Reactions   Erythromycin Diarrhea, Nausea And Vomiting      Medication List    TAKE these medications   acetaminophen 650 MG CR tablet Commonly known as: TYLENOL Take 1,300 mg by mouth 2 (two) times daily.   albuterol 108 (90 Base) MCG/ACT inhaler Commonly known as: VENTOLIN HFA Inhale 2 puffs into the lungs every 6 (six) hours as needed for wheezing or shortness of breath.   amLODipine 10 MG tablet Commonly known as: NORVASC Take 10 mg by mouth in the morning.   aspirin  EC 81 MG tablet Take 81 mg by mouth in the morning. Swallow whole.   atorvastatin 20 MG tablet Commonly known as: LIPITOR Take 1 tablet by mouth daily.   B-D UF III MINI PEN NEEDLES 31G X 5 MM Misc Generic drug: Insulin Pen Needle USE WITH LANTUS PEN   diclofenac Sodium 1 % Gel Commonly known as: VOLTAREN Apply 1 application topically 2 (two) times daily as needed.   DULoxetine 60 MG capsule Commonly known as: CYMBALTA Take 60 mg by mouth at bedtime.   Farxiga 10 MG Tabs tablet Generic drug: dapagliflozin propanediol Take 10 mg by mouth daily.   gabapentin 300 MG capsule Commonly known as: NEURONTIN TAKE 2 CAPSULES BY MOUTH THREE TIMES DAILY   guaiFENesin-dextromethorphan 100-10 MG/5ML syrup Commonly known as: ROBITUSSIN DM Take 10 mLs by mouth every 8 (eight) hours.   hydrochlorothiazide 25 MG tablet Commonly known as: HYDRODIURIL Take 25 mg by mouth every other day. In the morning   Lantus SoloStar 100 UNIT/ML Solostar Pen Generic drug: insulin glargine Inject 50 Units into the skin daily at 10 pm.   losartan 100 MG tablet Commonly known as: COZAAR TAKE 1 TABLET BY MOUTH ONCE DAILY What changed: when to take this   metFORMIN 1000 MG tablet Commonly known as: GLUCOPHAGE TAKE 1 TABLET BY MOUTH TWICE DAILY What changed: when to take this   metoprolol tartrate 25 MG tablet Commonly known as: LOPRESSOR TAKE 1 TABLET BY MOUTH TWICE DAILY What changed:   how much to take  how to  take this  when to take this  additional instructions   nitroGLYCERIN 0.4 MG SL tablet Commonly known as: NITROSTAT Place 1 tablet (0.4 mg total) under the tongue every 5 (five) minutes as needed. What changed:   when to take this  reasons to take this   Ozempic (0.25 or 0.5 MG/DOSE) 2 MG/1.5ML Sopn Generic drug: Semaglutide(0.25 or 0.5MG /DOS) Inject 0.5 mg into the skin every Sunday.   pantoprazole 40 MG tablet Commonly known as: Protonix Take 1 tablet (40 mg total) by  mouth daily.   potassium chloride SA 20 MEQ tablet Commonly known as: KLOR-CON Take 1 tablet (20 mEq total) by mouth every other day. In the morning   predniSONE 20 MG tablet Commonly known as: DELTASONE Take 3 tablets by mouth daily x1 day; then 2 tablets by mouth daily x2 days; then 1 tablet by mouth daily x3 days; then half tablet by mouth daily x3 days and stop prednisone.      Allergies  Allergen Reactions  . Erythromycin Diarrhea and Nausea And Vomiting    Follow-up Information    Caren Macadam, MD. Schedule an appointment as soon as possible for a visit in 10 day(s).   Specialty: Family Medicine Contact information: [address] [place] [address] [phone]        Herminio Commons, MD .   Specialty: Cardiology Contact information: [place] [address] [phone]               The results of significant diagnostics from this hospitalization (including imaging, microbiology, ancillary and laboratory) are listed below for reference.    Significant Diagnostic Studies: DG Chest 2 View  Result Date: 06/29/2020 CLINICAL DATA:  70 year old female with pleural effusion EXAM: CHEST - 2 VIEW COMPARISON:  10/23/2018 FINDINGS: Cardiomediastinal silhouette within normal limits. No evidence of central vascular congestion. No interlobular septal thickening. No pneumothorax pleural effusion or confluent airspace disease. No acute displaced fracture. Degenerative changes of the spine. IMPRESSION: Negative for acute cardiopulmonary disease Electronically Signed   By: Corrie Mckusick D.O.   On: 06/29/2020 12:32   CT Chest W Contrast  Result Date: 07/12/2020 CLINICAL DATA:  Respiratory illness, productive cough, chest congestion EXAM: CT CHEST WITH CONTRAST TECHNIQUE: Multidetector CT imaging of the chest was performed during intravenous contrast administration. CONTRAST:  60mL OMNIPAQUE IOHEXOL 300 MG/ML  SOLN COMPARISON:  None. FINDINGS:  Cardiovascular: At least mild coronary artery calcification. Global cardiac size is within normal limits. No pericardial effusion. The central pulmonary arteries are enlarged in keeping with changes of pulmonary arterial hypertension. Mild atherosclerotic calcification within the thoracic aorta. No thoracic aortic aneurysm. 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 Mediastinum/Nodes: Multiple nodules are seen within the thyroid isthmus and left thyroid lobe measuring up to 16 mm, indeterminate. Multiple calcified mediastinal lymph nodes are seen in keeping with changes of old granulomatous disease. No pathologic thoracic adenopathy. The esophagus is unremarkable. Lungs/Pleura: Lungs are clear. No pleural effusion or pneumothorax. The central airways are widely patent. Upper Abdomen: At least mild hepatic steatosis. No acute abnormality within the visualized upper abdomen. Musculoskeletal: No acute bone abnormality. No lytic or blastic bone lesions. IMPRESSION: No acute intrathoracic pathology. Mild coronary artery calcification. Morphologic changes in keeping with pulmonary arterial hypertension. Duplicated superior vena cava, a normal anatomic variant Multiple thyroid nodules measuring up to 16 mm. Recommend thyroid US (ref: J Am Coll Radiol. 2015 Feb;12(2): 143-50). Aortic Atherosclerosis (ICD10-I70.0). Electronically Signed  By: Fidela Salisbury MD   On: 07/12/2020 15:24   DG Chest Port 1 View  Result Date: 07/12/2020 CLINICAL DATA:  Productive cough and sore throat. EXAM: PORTABLE CHEST 1 VIEW COMPARISON:  Chest x-ray dated 06/29/2020 FINDINGS: Heart size and mediastinal contours are within normal limits. Lungs are clear. No pleural effusion or pneumothorax is seen. Osseous structures about the chest are unremarkable. IMPRESSION: No active disease. No evidence of pneumonia or pulmonary edema. Electronically Signed   By: Franki Cabot M.D.   On: 07/12/2020 09:19   US  THYROID  Result Date: 07/13/2020 CLINICAL DATA:  Incidental on CT. Thyroid nodules detected by CT of the chest. EXAM: THYROID ULTRASOUND TECHNIQUE: Ultrasound examination of the thyroid gland and adjacent soft tissues was performed. COMPARISON:  CT of the chest on 07/12/2020 FINDINGS: Parenchymal Echotexture: Mildly heterogenous Isthmus: 1.0 cm Right lobe: 4.5 x 1.9 x 2.1 cm Left lobe: 4.5 x 2.7 x 1.7 cm _________________________________________________________ Estimated total number of nodules >/= 1 cm: 2 Number of spongiform nodules >/=  2 cm not described below (TR1): 0 Number of mixed cystic and solid nodules >/= 1.5 cm not described below (TR2): 0 _________________________________________________________ Nodule # 1: Location: Right; Mid Maximum size: 1.0 cm; Other 2 dimensions: 1.0 x 1.0 cm Composition: mixed cystic and solid (1) Echogenicity: isoechoic (1) Shape: not taller-than-wide (0) Margins: smooth (0) Echogenic foci: none (0) ACR TI-RADS total points: 2. ACR TI-RADS risk category: TR2 (2 points). ACR TI-RADS recommendations: This nodule does NOT meet TI-RADS criteria for biopsy or dedicated follow-up. _________________________________________________________ Nodule # 2: Location: Left; Mid Maximum size: 1.5 cm; Other 2 dimensions: 1.4 x 1.4 cm Composition: mixed cystic and solid (1) Echogenicity: isoechoic (1) Shape: not taller-than-wide (0) Margins: smooth (0) Echogenic foci: none (0) ACR TI-RADS total points: 2. ACR TI-RADS risk category: TR2 (2 points). ACR TI-RADS recommendations: This nodule does NOT meet TI-RADS criteria for biopsy or dedicated follow-up. _________________________________________________________ No abnormal lymph nodes identified by ultrasound. IMPRESSION: 1 cm mid right and 1.5 cm mid left thyroid nodules do not meet criteria for biopsy or dedicated follow-up. The above is in keeping with the ACR TI-RADS recommendations - J Am Coll Radiol 2017;14:587-595. Electronically Signed    By: Aletta Edouard M.D.   On: 07/13/2020 15:06   ECHOCARDIOGRAM LIMITED  Result Date: 06/24/2020    ECHOCARDIOGRAM LIMITED REPORT   Patient Name:   Ann Roberson Date of Exam: 06/24/2020 Medical Rec #:  [id number]          Height:       64.0 in Accession #:    [id number]         Weight:       272.8 lb Date of Birth:  12-Jun-1950          BSA:          2.232 m Patient Age:    73 years           BP:           168/85 mmHg Patient Gender: F                  HR:           88 bpm. Exam Location:  [facility] Procedure: Limited Echo, Limited Color Doppler and Cardiac Doppler Indications:    I35.0 (ICD-10-CM) - Aortic valve stenosis, etiology of cardiac                 valve disease unspecified  History:        Patient has prior history of Echocardiogram examinations, most                 recent 01/15/2020. Risk Factors:Hypertension, Diabetes and                 Dyslipidemia. COVID-19 virus infection, DOE (dyspnea on                 exertion), Morbid obesity due to excess calories.  Sonographer:    Alvino Chapel RCS Referring Phys: [place]  1. Left ventricular ejection fraction, by estimation, is 60 to 65%. The left ventricle has normal function. The left ventricle has no regional wall motion abnormalities.  2. Right ventricular systolic function is normal. The right ventricular size is normal. Tricuspid regurgitation signal is inadequate for assessing PA pressure.  3. Fluid collection posterior to left venticle is most consistent with pleural effusion.  4. The mitral valve is grossly normal. Trivial mitral valve regurgitation.  5. The aortic valve has an indeterminant number of cusps. There is moderate calcification of the aortic valve. Aortic valve regurgitation is not visualized. Mild to moderate aortic valve stenosis. Aortic valve mean gradient measures 18.5 mmHg. Aortic valve Vmax measures 2.94 m/s. Dimentionless index 0.41.  6. The inferior vena cava is normal in size with greater than 50%  respiratory variability, suggesting right atrial pressure of 3 mmHg. FINDINGS  Left Ventricle: Left ventricular ejection fraction, by estimation, is 60 to 65%. The left ventricle has normal function. The left ventricle has no regional wall motion abnormalities. Right Ventricle: The right ventricular size is normal. No increase in right ventricular wall thickness. Right ventricular systolic function is normal. Tricuspid regurgitation signal is inadequate for assessing PA pressure. Pericardium: Fluid collection posterior to left venticle is most consistent with pleural effusion. Mitral Valve: The mitral valve is grossly normal. Mild mitral annular calcification. Trivial mitral valve regurgitation. Aortic Valve: The aortic valve has an indeterminant number of cusps. There is moderate calcification of the aortic valve. There is moderate aortic valve annular calcification. Aortic valve regurgitation is not visualized. Mild to moderate aortic stenosis  is present. Aortic valve mean gradient measures 18.5 mmHg. Aortic valve peak gradient measures 34.5 mmHg. Aortic valve area, by VTI measures 0.72 cm. Venous: The inferior vena cava is normal in size with greater than 50% respiratory variability, suggesting right atrial pressure of 3 mmHg. LEFT VENTRICLE PLAX 2D LVOT diam:     1.50 cm LV SV:         43 LV SV Index:   19 LVOT Area:     1.77 cm  AORTIC VALVE AV Area (Vmax):    0.78 cm AV Area (Vmean):   0.78 cm AV Area (VTI):     0.72 cm AV Vmax:           293.50 cm/s AV Vmean:          197.000 cm/s AV VTI:            0.595 m AV Peak Grad:      34.5 mmHg AV Mean Grad:      18.5 mmHg LVOT Vmax:         129.00 cm/s LVOT Vmean:        87.300 cm/s LVOT VTI:          0.244 m LVOT/AV VTI ratio: 0.41  SHUNTS Systemic VTI:  0.24 m Systemic Diam: 1.50 cm Rozann Lesches MD Electronically signed  by Rozann Lesches MD Signature Date/Time: 06/24/2020/11:19:22 AM    Final     Microbiology: Recent Results (from the past 240 hour(s))   Resp Panel by RT-PCR (Flu A&B, Covid) Nasopharyngeal Swab     Status: None   Collection Time: 07/12/20  8:46 AM   Specimen: Nasopharyngeal Swab; Nasopharyngeal(NP) swabs in vial transport medium  Result Value Ref Range Status   SARS Coronavirus 2 by RT PCR NEGATIVE NEGATIVE Final    Comment: (NOTE) SARS-CoV-2 target nucleic acids are NOT DETECTED.  The SARS-CoV-2 RNA is generally detectable in upper respiratory specimens during the acute phase of infection. The lowest concentration of SARS-CoV-2 viral copies this assay can detect is 138 copies/mL. A negative result does not preclude SARS-Cov-2 infection and should not be used as the sole basis for treatment or other patient management decisions. A negative result may occur with  improper specimen collection/handling, submission of specimen other than nasopharyngeal swab, presence of viral mutation(s) within the areas targeted by this assay, and inadequate number of viral copies(<138 copies/mL). A negative result must be combined with clinical observations, patient history, and epidemiological information. The expected result is Negative.  Fact Sheet for Patients:  EntrepreneurPulse.com.au  Fact Sheet for Healthcare Providers:  IncredibleEmployment.be  This test is no t yet approved or cleared by the [place] FDA and  has been authorized for detection and/or diagnosis of SARS-CoV-2 by FDA under an Emergency Use Authorization (EUA). This EUA will remain  in effect (meaning this test can be used) for the duration of the COVID-19 declaration under Section 564(b)(1) of the Act, 21 U.S.C.section 360bbb-3(b)(1), unless the authorization is terminated  or revoked sooner.       Influenza A by PCR NEGATIVE NEGATIVE Final   Influenza B by PCR NEGATIVE NEGATIVE Final    Comment: (NOTE) The Xpert Xpress SARS-CoV-2/FLU/RSV plus assay is intended as an aid in the diagnosis of influenza from  Nasopharyngeal swab specimens and should not be used as a sole basis for treatment. Nasal washings and aspirates are unacceptable for Xpert Xpress SARS-CoV-2/FLU/RSV testing.  Fact Sheet for Patients: EntrepreneurPulse.com.au  Fact Sheet for Healthcare Providers: IncredibleEmployment.be  This test is not yet approved or cleared by the [place] FDA and has been authorized for detection and/or diagnosis of SARS-CoV-2 by FDA under an Emergency Use Authorization (EUA). This EUA will remain in effect (meaning this test can be used) for the duration of the COVID-19 declaration under Section 564(b)(1) of the Act, 21 U.S.C. section 360bbb-3(b)(1), unless the authorization is terminated or revoked.  Performed at [facility], [address], [place], [place] [address]   MRSA PCR Screening     Status: None   Collection Time: 07/12/20  5:54 PM   Specimen: Nasal Mucosa; Nasopharyngeal  Result Value Ref Range Status   MRSA by PCR NEGATIVE NEGATIVE Final    Comment:        The GeneXpert MRSA Assay (FDA approved for NASAL specimens only), is one component of a comprehensive MRSA colonization surveillance program. It is not intended to diagnose MRSA infection nor to guide or monitor treatment for MRSA infections. Performed at [facility], [address], [place], [place] [address]      Labs: Basic Metabolic Panel: Recent Labs  Lab 07/12/20 1056  NA 138  K 4.6  CL 108  CO2 22  GLUCOSE 197*  BUN 20  CREATININE 1.20*  CALCIUM 8.6*   CBC: Recent Labs  Lab 07/12/20 1056  WBC 7.1  NEUTROABS 4.4  HGB 10.6*  HCT 35.3*  MCV 93.1  PLT 233    CBG: Recent Labs  Lab 07/12/20 1522 07/12/20 2054 07/13/20 0718 07/13/20 1119  GLUCAP 187* 338* 182* 228*   Signed:  Barton Dubois MD.  [facility] 07/13/2020, 3:42 PM

## 2020-07-14 LAB — HEMOGLOBIN A1C
Hgb A1c MFr Bld: 7.6 % — ABNORMAL HIGH (ref 4.8–5.6)
Mean Plasma Glucose: 171 mg/dL

## 2020-07-14 LAB — T4: T4, Total: 7.5 ug/dL (ref 4.5–12.0)

## 2020-07-14 LAB — T3, FREE: T3, Free: 2.1 pg/mL (ref 2.0–4.4)

## 2020-07-20 DIAGNOSIS — E118 Type 2 diabetes mellitus with unspecified complications: Secondary | ICD-10-CM | POA: Diagnosis not present

## 2020-07-20 DIAGNOSIS — I25118 Atherosclerotic heart disease of native coronary artery with other forms of angina pectoris: Secondary | ICD-10-CM | POA: Diagnosis not present

## 2020-07-20 DIAGNOSIS — D500 Iron deficiency anemia secondary to blood loss (chronic): Secondary | ICD-10-CM | POA: Diagnosis not present

## 2020-07-20 DIAGNOSIS — E785 Hyperlipidemia, unspecified: Secondary | ICD-10-CM | POA: Diagnosis not present

## 2020-07-20 DIAGNOSIS — I119 Hypertensive heart disease without heart failure: Secondary | ICD-10-CM | POA: Diagnosis not present

## 2020-07-20 DIAGNOSIS — I10 Essential (primary) hypertension: Secondary | ICD-10-CM | POA: Diagnosis not present

## 2020-07-22 DIAGNOSIS — J0110 Acute frontal sinusitis, unspecified: Secondary | ICD-10-CM | POA: Diagnosis not present

## 2020-07-22 DIAGNOSIS — E041 Nontoxic single thyroid nodule: Secondary | ICD-10-CM | POA: Diagnosis not present

## 2020-07-22 DIAGNOSIS — R059 Cough, unspecified: Secondary | ICD-10-CM | POA: Diagnosis not present

## 2020-07-22 DIAGNOSIS — D869 Sarcoidosis, unspecified: Secondary | ICD-10-CM | POA: Diagnosis not present

## 2020-07-22 DIAGNOSIS — E878 Other disorders of electrolyte and fluid balance, not elsewhere classified: Secondary | ICD-10-CM | POA: Diagnosis not present

## 2020-07-22 DIAGNOSIS — I2729 Other secondary pulmonary hypertension: Secondary | ICD-10-CM | POA: Diagnosis not present

## 2020-07-27 DIAGNOSIS — E118 Type 2 diabetes mellitus with unspecified complications: Secondary | ICD-10-CM | POA: Diagnosis not present

## 2020-07-27 DIAGNOSIS — M170 Bilateral primary osteoarthritis of knee: Secondary | ICD-10-CM | POA: Diagnosis not present

## 2020-07-27 DIAGNOSIS — E785 Hyperlipidemia, unspecified: Secondary | ICD-10-CM | POA: Diagnosis not present

## 2020-07-27 DIAGNOSIS — I25118 Atherosclerotic heart disease of native coronary artery with other forms of angina pectoris: Secondary | ICD-10-CM | POA: Diagnosis not present

## 2020-07-27 DIAGNOSIS — I10 Essential (primary) hypertension: Secondary | ICD-10-CM | POA: Diagnosis not present

## 2020-07-27 DIAGNOSIS — D500 Iron deficiency anemia secondary to blood loss (chronic): Secondary | ICD-10-CM | POA: Diagnosis not present

## 2020-07-27 DIAGNOSIS — I119 Hypertensive heart disease without heart failure: Secondary | ICD-10-CM | POA: Diagnosis not present

## 2020-07-31 DIAGNOSIS — E113213 Type 2 diabetes mellitus with mild nonproliferative diabetic retinopathy with macular edema, bilateral: Secondary | ICD-10-CM | POA: Diagnosis not present

## 2020-08-11 ENCOUNTER — Ambulatory Visit (INDEPENDENT_AMBULATORY_CARE_PROVIDER_SITE_OTHER): Payer: Medicare Other | Admitting: Internal Medicine

## 2020-08-11 ENCOUNTER — Other Ambulatory Visit: Payer: Self-pay

## 2020-08-11 ENCOUNTER — Encounter: Payer: Self-pay | Admitting: Internal Medicine

## 2020-08-11 DIAGNOSIS — R0609 Other forms of dyspnea: Secondary | ICD-10-CM

## 2020-08-11 DIAGNOSIS — R058 Other specified cough: Secondary | ICD-10-CM

## 2020-08-11 DIAGNOSIS — R0600 Dyspnea, unspecified: Secondary | ICD-10-CM

## 2020-08-11 MED ORDER — TRAMADOL HCL 50 MG PO TABS: 50.0000 mg | ORAL_TABLET | ORAL | 0 refills | Status: AC | PRN

## 2020-08-11 MED ORDER — AMOXICILLIN-POT CLAVULANATE 875-125 MG PO TABS: 1.0000 | ORAL_TABLET | Freq: Two times a day (BID) | ORAL | 0 refills | Status: AC

## 2020-08-11 MED ORDER — METHYLPREDNISOLONE ACETATE 80 MG/ML IJ SUSP
120.0000 mg | Freq: Once | INTRAMUSCULAR | Status: AC
  Administered 2020-08-11: 120 mg via INTRAMUSCULAR

## 2020-08-11 MED ORDER — FAMOTIDINE 20 MG PO TABS: ORAL_TABLET | ORAL | 11 refills | Status: DC

## 2020-08-11 NOTE — Assessment & Plan Note (Signed)
Onset 2018  Echo 04/12/17  Left ventricle: The cavity size was normal. Wall thickness was   increased in a pattern of mild LVH. Systolic function was normal.   The estimated ejection fraction was in the range of 60% to 65%.   Wall motion was normal; there were no regional wall motion   abnormalities. Doppler parameters are consistent with abnormal   left ventricular relaxation (grade 1 diastolic dysfunction).   Doppler parameters are consistent with high ventricular filling   pressure. - Aortic valve: Trileaflet; mildly thickened, mildly calcified   leaflets. There was mild stenosis. Peak velocity (S): 248 cm/s.   Mean gradient (S): 13 mm Hg.   - Spirometry 05/01/2017  FEV1 1.72 (83%)  Ratio 83 on no rx / no curvature   - PFT's  06/20/2017  FEV1 1.90 (96 % ) ratio 88  p 4 % improvement from saba p 0 prior to study with DLCO  73/79 % corrects to 107  % for alv volume  With ERV 52%  - Echo 06/24/20 mild/mod AS   See rx for uacs > f/u with PFTs in 6 weeks

## 2020-08-11 NOTE — Assessment & Plan Note (Signed)
Max rx for GERD 05/01/2017  - pft's 06/20/2017 s obst  - 1st gen H1 blockers per guidelines  06/20/2017  improved - recurred p ET 04/02/24 > cyclical cough rx    Of the three most common causes of  Sub-acute / recurrent or chronic cough, only one (GERD)  can actually contribute to/ trigger  the other two (asthma and post nasal drip syndrome)  and perpetuate the cylce of cough.  While not intuitively obvious, many patients with chronic low grade reflux do not cough until there is a primary insult that disturbs the protective epithelial barrier and exposes sensitive nerve endings.   This is typically viral but can due to PNDS and  either may apply here.   The point is that once this occurs, it is difficult to eliminate the cycle  using anything but a maximally effective acid suppression regimen at least in the short run, accompanied by an appropriate diet to address non acid GERD and control / eliminate the cough itself for at least 3 days with tramadol  >>> also so added  Depomedrol 80 mg in case of component of Th-2 driven upper or lower airways inflammation (if cough responds short term only to relapse before return while will on full rx for uacs (as above), then  that would point to allergic rhinitis/ asthma or eos bronchitis as alternative dx)   Also added augmentin as  May have underlying sinusitis with ent planned in f/u if not responding to empirical rx.

## 2020-08-11 NOTE — Patient Instructions (Addendum)
For drainage / throat tickle try take CHLORPHENIRAMINE  4 mg  (Chlortab 4mg   at [employer] should be easiest to find in the green box)  take one every 4 hours as needed - available over the counter- may cause drowsiness so start with just a dose or two an hour before bedtime and see how you tolerate it before trying in daytime    Augmentin 875 mg take one pill twice daily  X 10 days - take at breakfast and supper with large glass of water.  It would help reduce the usual side effects (diarrhea and yeast infections) if you ate cultured yogurt at lunch.   Pantoprazole (protonix) 40 mg   Take  30-60 min before first meal of the day and Pepcid (famotidine)  20 mg after supper until return to office - this is the best way to tell whether stomach acid is contributing to your problem.    GERD (REFLUX)  is an extremely common cause of respiratory symptoms just like yours , many times with no obvious heartburn at all.    It can be treated with medication, but also with lifestyle changes including elevation of the head of your bed (ideally with 6 -8inch blocks under the headboard of your bed),  Smoking cessation, avoidance of late meals, excessive alcohol, and avoid fatty foods, chocolate, peppermint, colas, red wine, and acidic juices such as orange juice.  NO MINT OR MENTHOL PRODUCTS SO NO COUGH DROPS  USE SUGARLESS CANDY INSTEAD (Jolley ranchers or Stover's or Life Savers) or even ice chips will also do - the key is to swallow to prevent all throat clearing. NO OIL BASED VITAMINS - use powdered substitutes.  Avoid fish oil when coughing.  Depomedrol 120 mg IM  Take delsym two tsp every 12 hours and supplement if needed with  tramadol 50 mg up to 1 every 4 hours to suppress the urge to cough. Swallowing water and/or using ice chips/non mint and menthol containing candies (such as lifesavers or sugarless jolly ranchers) are also effective.  You should rest your voice and avoid activities that you know  make you cough.  Once you have eliminated the cough for 3 straight days try reducing the tramadol first,  then the delsym as tolerated.   - late add: advised verbally to hold cymbalta while on tramadol    Please schedule a follow up office visit in 6 weeks, call sooner if needed with pfts

## 2020-08-11 NOTE — Assessment & Plan Note (Signed)
PFTs 06/20/2017  With ERV 52% c/w effects of obesity with wt 264   Body mass index is 45.32 kg/m.  -   Lab Results  Component Value Date   TSH 0.149 (L) 07/13/2020     Contributing to gerd risk/ doe/reviewed the need and the process to achieve and maintain neg calorie balance > defer f/u primary care including intermittently monitoring thyroid status          Each maintenance medication was reviewed in detail including emphasizing most importantly the difference between maintenance and prns and under what circumstances the prns are to be triggered using an action plan format where appropriate.  Total time for H and P, chart review, counseling, reviewing and generating customized AVS unique to this office visit with pt new as not seen in > 3 y  / same day charting = 53 min

## 2020-08-11 NOTE — Progress Notes (Signed)
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 Due to recurrent sarcoid.   History of Present Illness  05/01/2017 [facility] office visit/ Lynx Goodrich   Chief Complaint  Patient presents with  . Pulmonary Consult    Referred by Dr. Mannie Stabile for Sarcoid eval. Pt states she was dxed with Sarcoid in 2003 by skin bx. She states she has been wheezing and coughing- comes and goes. Cough is non prod.   sob x one year can still do [employer] leaning on cart / has to use scooter to do wm/ uses [facility] parking/ no 02  No inhalers Sleeps on L side / feels choking immediately when try to lie down  on back  X decades  Stopped lisinopril 10/2015 due to hives/ not cough (as far as she can remember) Cough comes and goes day > noct and non productive ? Worse with voice use assoc sometime with audible wheezing  No better on symbicort rec Pantoprazole (protonix) 40 mg   Take  30-60 min before first meal of the day and Pepcid (famotidine)  20 mg one @  bedtime until return to office - this is the best way to tell whether stomach acid is contributing to your problem.   GERD diet    06/20/2017  f/u ov/Pati Thinnes re: uacs   Chief Complaint  Patient presents with  . Follow-up    PFT's done today. Her breathing is unchanged since the last visit and no new co's today.   Dyspnea:  Really more limited by legs Cough: better / some pnds Sleep: on side horizontal SABA use:  N/a  rec Your only pulmonary problems are all related to your weight  For  drainage / throat tickle try take CHLORPHENIRAMINE  4 mg  No need for pulmonary medications or follow up  Admit date: 07/12/2020 Discharge date: 07/13/2020  Discharge Diagnoses:  Principal Problem:   Acute respiratory failure with hypoxia ([place])   Hypertension associated with diabetes ([place])   Obesity, Class III, BMI 40-49.9 (morbid obesity) ([place])   Multiple thyroid nodules   Wheezing  CODE STATUS: Full code.  Diet recommendation: Modified carbohydrates, heart healthy and low calorie diet.  History of present illness:  As per H&P written by Dr. Denton Brick on 07/13/2018 Cloyd Stagers Wilsonis a 70 y.o.femaleis a 70 y.o.femalewith medical history significant forsarcoidosis, angioedema, hypertension and diabetes, morbid obesity. Patient presented to the ED with complaints of cough, congestion and sore throat that started 4/11 the day she had her colonoscopy done with ET in place.  Cough is productive of yellowish sputum. She reports noisy breathing also. Patient reports a history of sarcoidosis, diagnosed with skin biopsy, subsequently treated at some point with steroids for difficulty breathing with improvement, this was over 15 years ago. She iscurrently not on treatment for her sarcoidosis, and she has not hassubsequently not had any problems related to her sarcoidosis.  Never smoked cigarettes. No history of asthma. Received all3 Covid  vaccinations.  ED Course:Tachycardic to 120, respiratory rate 18-34.O2 sats greater than 90% on room air. Due to concern for aspiration with recent colonoscopic, chest CT with contrast was obtained-no acute intrathoracic pathology, changes in keeping with pulmonary arterial hypertension, multiple thyroid nodules. 125 mg Solu-Medrol and continuous nebs given in ED. With persistent tachycardia, tachypnea, and after ED treatment, patient's O2 sat dropped to 88% on room air with ambulation hospitalist was called to admit.   Hospital Course:  1-acute respiratory  failure with hypoxia -In the setting of mild sarcoidosis flare. -No fever no frank infiltrates appreciated on CT scan -At time of admission 2 L nasal cannula supplementation were provided as the patient was 88% on room air. -Rapid improvement with steroid usage -Concerns for obesity hypoventilation syndrome along with thiamine reflux explanation -Patient has been discharge on the steroids tapering, PPI and instructions to follow-up with PCP as an outpatient. -As needed bronchodilators and antitussive/mucolytic's medications were prescribed. -No oxygen requirement appreciated during desaturation screening at time of discharge.  2-morbid obesity -Body mass index is 45.52 kg/m. -Low calorie diet, portion control and increase physical activity discussed with patient -Patient will benefit of outpatient referral to bariatric clinic.  3-type 2 diabetes mellitus -Modified carbohydrate diet has been encouraged -Resume home hypoglycemic regimen  4-gastroesophageal flux disease -As mentioned above patient has been discharged on PPI. -Protonix will also help for GI prophylaxis while actively receiving treatment with a steroid tapering.  5-hypertension -Resume home antihypertensive agent -Heart healthy diet has been encouraged.  6-diabetic neuropathy -Resume the use of Neurontin.  7-multiple thyroid nodules incidentally found on CT -Following radiology recommendations thyroid ultrasound has been completed -TSH 0.149 -T3 and T4 pending at discharge -Patient will benefit of outpatient follow-up with endocrinology service.    08/11/2020  f/u ov/[place] office/Gearl Baratta re: was working up until [employer] Complaint  Patient presents with  . Acute visit    Increased SOB and cough since had colonoscopy 07/06/20. She states coughing up yellow sputum. She is using her albuterol inhaler 2 x at night on average.    Dyspnea: doe since 2018 / AS/ MO previously able to do [employer] shopping  before covid, now needing scooter Cough: yellow sporadic  Sleeping: bed is flat / 2 pillow  SABA use: helps with noct wheeze (new) 02: none  Covid status: vax x 3      No obvious day to day or daytime variability or assoc excess/ purulent sputum or mucus plugs or hemoptysis or cp or chest tightness, subjective wheeze or overt sinus or hb symptoms.   Sleeping without nocturnal  or early am exacerbation  of respiratory  c/o's or need for noct saba. Also denies any obvious fluctuation of symptoms with weather or environmental changes or other aggravating or alleviating factors except as outlined above   No unusual exposure hx or h/o childhood pna/ asthma or knowledge of premature birth.  Current Allergies, Complete Past Medical History, Past Surgical History, Family History, and Social History were reviewed in [employer] record.  ROS  The following are not active complaints unless bolded Hoarseness, sore throat, dysphagia, dental problems, itching, sneezing,  nasal congestion or discharge of excess mucus or purulent secretions, ear ache,   fever, chills, sweats, unintended wt loss or wt gain, classically pleuritic or exertional cp,  orthopnea pnd or arm/hand swelling  or leg swelling, presyncope, palpitations, abdominal pain, anorexia, nausea, vomiting, diarrhea  or change in bowel habits or change in bladder habits, change in stools  or change in urine, dysuria, hematuria,  rash, arthralgias, visual complaints, headache, numbness, weakness or ataxia or problems with walking or coordination,  change in mood or  memory.        Current Meds  Medication Sig  . acetaminophen (TYLENOL) 650 MG CR tablet Take 1,300 mg by mouth 2 (two) times daily.  [name] albuterol (VENTOLIN HFA) 108 (90 Base) MCG/ACT inhaler Inhale 2 puffs into the lungs every 6 (six) hours as needed for wheezing or shortness of breath.  [name] amLODipine (NORVASC) 10 MG tablet Take 10 mg by mouth in the morning.  [name]  aspirin EC 81 MG tablet Take 81 mg by mouth in the morning. Swallow whole.  [name] atorvastatin (LIPITOR) 20 MG tablet Take 1 tablet by mouth daily.  . B-D UF III MINI PEN NEEDLES 31G X 5 MM MISC USE WITH LANTUS PEN  . diclofenac Sodium (VOLTAREN) 1 % GEL Apply 1 application topically 2 (two) times daily as needed.  . DULoxetine (CYMBALTA) 60 MG capsule Take 60 mg by mouth at bedtime.  [name] FARXIGA 10 MG TABS tablet Take 10 mg by mouth daily.  [name] gabapentin (NEURONTIN) 300 MG capsule TAKE 2 CAPSULES BY MOUTH THREE TIMES DAILY (Patient taking differently: Take 600 mg by mouth 3 (three) times daily.)  . guaiFENesin-dextromethorphan (ROBITUSSIN DM) 100-10 MG/5ML syrup Take 10 mLs by mouth every 8 (eight) hours.  . hydrochlorothiazide (HYDRODIURIL) 25 MG tablet Take 25 mg by mouth every other day. In the morning  . LANTUS SOLOSTAR 100 UNIT/ML Solostar Pen Inject 50 Units into the skin daily at 10 pm.  . losartan (COZAAR) 100 MG tablet TAKE 1 TABLET BY MOUTH ONCE DAILY (Patient taking differently: Take 100 mg by mouth in the morning.)  . metFORMIN (GLUCOPHAGE) 1000 MG tablet TAKE 1 TABLET BY MOUTH TWICE DAILY (Patient taking differently: Take 1,000 mg by mouth in the morning and at bedtime.)  . metoprolol tartrate (LOPRESSOR) 25 MG tablet TAKE 1 TABLET BY MOUTH TWICE DAILY (Patient taking differently: Take 25 mg by mouth 2 (two) times daily.)  . nitroGLYCERIN (NITROSTAT) 0.4 MG SL tablet Place 1 tablet (0.4 mg total) under the tongue every 5 (five) minutes as needed. (Patient taking differently: Place 0.4 mg under the tongue every 5 (five) minutes x 3 doses as needed for chest pain.)  . OZEMPIC, 0.25 OR 0.5 MG/DOSE, 2 MG/1.5ML SOPN Inject 0.5 mg into the skin every Sunday.  . pantoprazole (PROTONIX) 40 MG tablet Take 1 tablet (40 mg total) by mouth daily.  . potassium chloride SA (KLOR-CON) 20 MEQ tablet Take 1 tablet (20 mEq total) by mouth every other day. In the morning                             Objective:   Physical Exam    08/11/2020       264 06/20/2017       26 4   05/01/17 261 lb 9.6 oz (118.7 kg)  04/06/17 259 lb (117.5 kg)  08/17/16 258 lb (117 kg)     Vital signs reviewed  08/11/2020  - Note at rest 02 sats  96% on RA   General appearance:    amb obese hoarse bf nad  Prominent pseudowheeze   Difficulty climbing on table/ walks with cane    HEENT : pt wearing mask not removed for exam due to covid -19 concerns.    NECK :  without JVD/Nodes/TM/ nl carotid upstrokes  bilaterally   LUNGS: no acc muscle use,  Nl contour chest which is clear to A and P bilaterally without cough on insp or exp maneuvers   CV:  RRR  no s3  2-3/6 sem s increase in P2, and trace edema both LE   ABD:  soft and nontender with nl inspiratory excursion in the supine position. No bruits or organomegaly appreciated, bowel sounds nl  MS:   ext warm without deformities, calf tenderness, cyanosis or clubbing No obvious joint restrictions   SKIN: warm and dry without lesions    NEURO:  alert, approp, nl sensorium with  no motor or cerebellar deficits apparent.              I personally reviewed images and agree with radiology impression as follows:   Chest CT s contrast 07/12/20  No acute intrathoracic pathology.  Mild coronary artery calcification.  Morphologic changes in keeping with pulmonary arterial hypertension.  Duplicated superior vena cava, a normal anatomic variant  Multiple thyroid nodules measuring up to 16 mm. Recommend thyroid US (ref: J Am Coll Radiol. 2015 Feb;12(2): 143-50).  Aortic Atherosclerosis (ICD10-I70.0).      Assessment:

## 2020-08-19 ENCOUNTER — Other Ambulatory Visit (HOSPITAL_COMMUNITY): Payer: Self-pay | Admitting: Family Medicine

## 2020-08-19 DIAGNOSIS — E041 Nontoxic single thyroid nodule: Secondary | ICD-10-CM

## 2020-08-27 ENCOUNTER — Ambulatory Visit (HOSPITAL_COMMUNITY)
Admission: RE | Admit: 2020-08-27 | Discharge: 2020-08-27 | Disposition: A | Discharge status: Home / Self Care | Payer: Medicare Other | Source: Ambulatory Visit | Attending: Family Medicine | Admitting: Family Medicine

## 2020-08-27 ENCOUNTER — Other Ambulatory Visit: Payer: Self-pay

## 2020-08-27 ENCOUNTER — Encounter (HOSPITAL_COMMUNITY): Payer: Self-pay

## 2020-08-27 DIAGNOSIS — E041 Nontoxic single thyroid nodule: Secondary | ICD-10-CM

## 2020-08-28 DIAGNOSIS — I119 Hypertensive heart disease without heart failure: Secondary | ICD-10-CM | POA: Diagnosis not present

## 2020-08-28 DIAGNOSIS — E118 Type 2 diabetes mellitus with unspecified complications: Secondary | ICD-10-CM | POA: Diagnosis not present

## 2020-08-28 DIAGNOSIS — I10 Essential (primary) hypertension: Secondary | ICD-10-CM | POA: Diagnosis not present

## 2020-08-28 DIAGNOSIS — E785 Hyperlipidemia, unspecified: Secondary | ICD-10-CM | POA: Diagnosis not present

## 2020-08-28 DIAGNOSIS — D500 Iron deficiency anemia secondary to blood loss (chronic): Secondary | ICD-10-CM | POA: Diagnosis not present

## 2020-08-28 DIAGNOSIS — I25118 Atherosclerotic heart disease of native coronary artery with other forms of angina pectoris: Secondary | ICD-10-CM | POA: Diagnosis not present

## 2020-09-25 DIAGNOSIS — I25118 Atherosclerotic heart disease of native coronary artery with other forms of angina pectoris: Secondary | ICD-10-CM | POA: Diagnosis not present

## 2020-09-25 DIAGNOSIS — M170 Bilateral primary osteoarthritis of knee: Secondary | ICD-10-CM | POA: Diagnosis not present

## 2020-09-25 DIAGNOSIS — D500 Iron deficiency anemia secondary to blood loss (chronic): Secondary | ICD-10-CM | POA: Diagnosis not present

## 2020-09-25 DIAGNOSIS — I119 Hypertensive heart disease without heart failure: Secondary | ICD-10-CM | POA: Diagnosis not present

## 2020-09-25 DIAGNOSIS — I10 Essential (primary) hypertension: Secondary | ICD-10-CM | POA: Diagnosis not present

## 2020-10-09 ENCOUNTER — Other Ambulatory Visit (HOSPITAL_COMMUNITY)
Admission: RE | Admit: 2020-10-09 | Discharge: 2020-10-09 | Disposition: A | Discharge status: Home / Self Care | Payer: Medicare Other | Source: Ambulatory Visit | Attending: Internal Medicine | Admitting: Internal Medicine

## 2020-10-09 ENCOUNTER — Other Ambulatory Visit: Payer: Self-pay

## 2020-10-09 DIAGNOSIS — Z20822 Contact with and (suspected) exposure to covid-19: Secondary | ICD-10-CM | POA: Insufficient documentation

## 2020-10-09 DIAGNOSIS — Z01812 Encounter for preprocedural laboratory examination: Secondary | ICD-10-CM | POA: Insufficient documentation

## 2020-10-09 LAB — SARS CORONAVIRUS 2 (TAT 6-24 HRS): SARS Coronavirus 2: NEGATIVE

## 2020-10-13 ENCOUNTER — Encounter: Payer: Self-pay | Admitting: *Deleted

## 2020-10-13 ENCOUNTER — Ambulatory Visit (HOSPITAL_COMMUNITY)
Admission: RE | Admit: 2020-10-13 | Discharge: 2020-10-13 | Disposition: A | Discharge status: Home / Self Care | Payer: Medicare Other | Source: Ambulatory Visit | Attending: Internal Medicine | Admitting: Internal Medicine

## 2020-10-13 ENCOUNTER — Other Ambulatory Visit: Payer: Self-pay

## 2020-10-13 DIAGNOSIS — R0609 Other forms of dyspnea: Secondary | ICD-10-CM

## 2020-10-13 DIAGNOSIS — R058 Other specified cough: Secondary | ICD-10-CM | POA: Diagnosis not present

## 2020-10-13 DIAGNOSIS — R0600 Dyspnea, unspecified: Secondary | ICD-10-CM | POA: Insufficient documentation

## 2020-10-13 LAB — PULMONARY FUNCTION TEST
DL/VA % pred: 83 %
DL/VA: 3.49 ml/min/mmHg/L
DLCO unc % pred: 58 %
DLCO unc: 11.42 ml/min/mmHg
FEF 25-75 Post: 0.94 L/sec
FEF 25-75 Pre: 2.31 L/sec
FEF2575-%Change-Post: -59 %
FEF2575-%Pred-Post: 52 %
FEF2575-%Pred-Pre: 127 %
FEV1-%Change-Post: -16 %
FEV1-%Pred-Post: 89 %
FEV1-%Pred-Pre: 107 %
FEV1-Post: 1.71 L
FEV1-Pre: 2.05 L
FEV1FVC-%Change-Post: -2 %
FEV1FVC-%Pred-Pre: 108 %
FEV6-%Change-Post: -14 %
FEV6-%Pred-Post: 87 %
FEV6-%Pred-Pre: 102 %
FEV6-Post: 2.07 L
FEV6-Pre: 2.42 L
FEV6FVC-%Pred-Post: 103 %
FEV6FVC-%Pred-Pre: 103 %
FVC-%Change-Post: -14 %
FVC-%Pred-Post: 83 %
FVC-%Pred-Pre: 98 %
FVC-Post: 2.07 L
FVC-Pre: 2.42 L
Post FEV1/FVC ratio: 83 %
Post FEV6/FVC ratio: 100 %
Pre FEV1/FVC ratio: 84 %
Pre FEV6/FVC Ratio: 100 %

## 2020-10-13 MED ORDER — ALBUTEROL SULFATE (2.5 MG/3ML) 0.083% IN NEBU
2.5000 mg | INHALATION_SOLUTION | Freq: Once | RESPIRATORY_TRACT | Status: AC
  Administered 2020-10-13: 2.5 mg via RESPIRATORY_TRACT

## 2020-10-20 NOTE — Progress Notes (Signed)
Spoke with pt and notified of results per Dr. Wert. Pt verbalized understanding and denied any questions. 

## 2020-11-23 DIAGNOSIS — I25118 Atherosclerotic heart disease of native coronary artery with other forms of angina pectoris: Secondary | ICD-10-CM | POA: Diagnosis not present

## 2020-11-23 DIAGNOSIS — E118 Type 2 diabetes mellitus with unspecified complications: Secondary | ICD-10-CM | POA: Diagnosis not present

## 2020-11-23 DIAGNOSIS — I119 Hypertensive heart disease without heart failure: Secondary | ICD-10-CM | POA: Diagnosis not present

## 2020-11-23 DIAGNOSIS — D500 Iron deficiency anemia secondary to blood loss (chronic): Secondary | ICD-10-CM | POA: Diagnosis not present

## 2020-11-23 DIAGNOSIS — E785 Hyperlipidemia, unspecified: Secondary | ICD-10-CM | POA: Diagnosis not present

## 2020-11-23 DIAGNOSIS — I10 Essential (primary) hypertension: Secondary | ICD-10-CM | POA: Diagnosis not present

## 2020-11-23 DIAGNOSIS — M170 Bilateral primary osteoarthritis of knee: Secondary | ICD-10-CM | POA: Diagnosis not present

## 2020-12-01 DIAGNOSIS — M170 Bilateral primary osteoarthritis of knee: Secondary | ICD-10-CM | POA: Diagnosis not present

## 2020-12-01 DIAGNOSIS — E118 Type 2 diabetes mellitus with unspecified complications: Secondary | ICD-10-CM | POA: Diagnosis not present

## 2020-12-01 DIAGNOSIS — I119 Hypertensive heart disease without heart failure: Secondary | ICD-10-CM | POA: Diagnosis not present

## 2020-12-01 DIAGNOSIS — E785 Hyperlipidemia, unspecified: Secondary | ICD-10-CM | POA: Diagnosis not present

## 2020-12-01 DIAGNOSIS — I25118 Atherosclerotic heart disease of native coronary artery with other forms of angina pectoris: Secondary | ICD-10-CM | POA: Diagnosis not present

## 2020-12-01 DIAGNOSIS — I10 Essential (primary) hypertension: Secondary | ICD-10-CM | POA: Diagnosis not present

## 2020-12-01 DIAGNOSIS — D500 Iron deficiency anemia secondary to blood loss (chronic): Secondary | ICD-10-CM | POA: Diagnosis not present

## 2020-12-15 NOTE — Progress Notes (Signed)
Cardiology Office Note    Date:  12/21/2020   ID:  [name], Richwine 11/27/1950, MRN [id number]   PCP:  Caren Macadam, [place]  Cardiologist:  Kate Sable, MD (Inactive)   Advanced Practice Provider:  No care team member to display Electrophysiologist:  None   [id number]}   Chief Complaint  Patient presents with   Follow-up     History of Present Illness:  Ann Roberson is a 70 y.o. female with history of presumed CAD abnormal nuclear study has never been cath because she was symptomatically stable, hypertension, aortic stenosis mild on echo 2019, HLD, IDDM.Echo 06/24/20 LVEF 60-65% mild -mod AS means gradient 18.5 mmHg up from 13 mmHg 2013, possible left pleural effusion..    I saw the patient 06/29/2020 complaining of dyspnea on exertion that was unchanged but swollen feet and getting a lot of salt in his diet.  I told her she could take an extra HCTZ as needed.  Follow-up chest x-ray no effusion.  Patient comes in for f/u. Swelling better with cooler weather. Breathing hasn't changed. No chest pain. Walks with a cane. Not very active. Not trying to lose weight and not interested.   Past Medical History:  Diagnosis Date   Anemia    Arthritis    Diabetes mellitus    GERD (gastroesophageal reflux disease)    Heart murmur    Hyperlipidemia    Hypertension    Neuropathy    Sarcoidosis     Past Surgical History:  Procedure Laterality Date   BIOPSY  02/08/2018   Procedure: BIOPSY;  Surgeon: Daneil Dolin, MD;  [facility];  Service: Endoscopy;;   CESAREAN SECTION     CHOLECYSTECTOMY     COLONOSCOPY WITH PROPOFOL N/A 07/06/2017   Dr. Gala Romney: Diverticulosis.five 8 to 16 mm polyps in the ascending colon and in the cecum, removed with hot snare.  Largest polyp could not be removed completely, status post inking.  Pathology revealed cecal tubular adenoma, a sending colon polyps with multiple fragments of tubular  adenomas, tubulovillous adenoma, no high-grade dysplasia or malignancy.  Tubular adenomas removed from the rectum as    COLONOSCOPY WITH PROPOFOL N/A 02/08/2018   12 mm tubular adenoma removed from the ileocecal valve, hyperplastic sigmoid colon polyp removed.  Possible residual polypoid tissue at the ascending colon segment previously tattooed, biopsied (tubular adenoma) and ablated with APC.  Recommend 2-year surveillance study.   COLONOSCOPY WITH PROPOFOL N/A 07/06/2020   Procedure: COLONOSCOPY WITH PROPOFOL;  Surgeon: Daneil Dolin, MD;  [facility];  Service: Endoscopy;  Laterality: N/A;  am appt, diabetic. general anesthesia; intubation to control respirations. Add extra 15 min - needs to be 1st case per Melanie   MASTECTOMY, PARTIAL Left    benign   POLYPECTOMY  07/06/2017   Procedure: POLYPECTOMY;  Surgeon: Daneil Dolin, MD;  [facility];  Service: Endoscopy;;  cecal polyp cs, ascending colon polyp hs   POLYPECTOMY  02/08/2018   Procedure: POLYPECTOMY;  Surgeon: Daneil Dolin, MD;  [facility];  Service: Endoscopy;;   POLYPECTOMY  07/06/2020   Procedure: POLYPECTOMY INTESTINAL;  Surgeon: Daneil Dolin, MD;  [facility];  Service: Endoscopy;;   TUBAL LIGATION      Current Medications: Current Meds  Medication Sig   acetaminophen (TYLENOL) 650 MG CR tablet Take 1,300 mg by mouth 2 (two) times daily.   albuterol (VENTOLIN  HFA) 108 (90 Base) MCG/ACT inhaler Inhale 2 puffs into the lungs every 6 (six) hours as needed for wheezing or shortness of breath.   amLODipine (NORVASC) 10 MG tablet Take 10 mg by mouth in the morning.   aspirin EC 81 MG tablet Take 81 mg by mouth in the morning. Swallow whole.   atorvastatin (LIPITOR) 20 MG tablet Take 1 tablet by mouth daily.   B-D UF III MINI PEN NEEDLES 31G X 5 MM MISC USE WITH LANTUS PEN   diclofenac Sodium (VOLTAREN) 1 % GEL Apply 1 application topically 2 (two) times daily as needed.    DULoxetine (CYMBALTA) 60 MG capsule Take 60 mg by mouth at bedtime.   famotidine (PEPCID) 20 MG tablet One after supper   FARXIGA 10 MG TABS tablet Take 10 mg by mouth daily.   gabapentin (NEURONTIN) 300 MG capsule TAKE 2 CAPSULES BY MOUTH THREE TIMES DAILY (Patient taking differently: Take 600 mg by mouth 3 (three) times daily.)   guaiFENesin-dextromethorphan (ROBITUSSIN DM) 100-10 MG/5ML syrup Take 10 mLs by mouth every 8 (eight) hours.   hydrochlorothiazide (HYDRODIURIL) 25 MG tablet Take 25 mg by mouth every other day. In the morning   LANTUS SOLOSTAR 100 UNIT/ML Solostar Pen Inject 50 Units into the skin daily at 10 pm.   losartan (COZAAR) 100 MG tablet TAKE 1 TABLET BY MOUTH ONCE DAILY (Patient taking differently: Take 100 mg by mouth in the morning.)   metFORMIN (GLUCOPHAGE) 1000 MG tablet TAKE 1 TABLET BY MOUTH TWICE DAILY (Patient taking differently: Take 1,000 mg by mouth in the morning and at bedtime.)   metoprolol tartrate (LOPRESSOR) 25 MG tablet TAKE 1 TABLET BY MOUTH TWICE DAILY (Patient taking differently: Take 25 mg by mouth 2 (two) times daily.)   nitroGLYCERIN (NITROSTAT) 0.4 MG SL tablet Place 1 tablet (0.4 mg total) under the tongue every 5 (five) minutes as needed. (Patient taking differently: Place 0.4 mg under the tongue every 5 (five) minutes x 3 doses as needed for chest pain.)   OZEMPIC, 0.25 OR 0.5 MG/DOSE, 2 MG/1.5ML SOPN Inject 0.5 mg into the skin every Sunday.   pantoprazole (PROTONIX) 40 MG tablet Take 1 tablet (40 mg total) by mouth daily.   potassium chloride SA (KLOR-CON) 20 MEQ tablet Take 1 tablet (20 mEq total) by mouth every other day. In the morning     Allergies:   Erythromycin   Social History   Socioeconomic History   Marital status: Single    Spouse name: Not on file   Number of children: Not on file   Years of education: Not on file   Highest education level: Not on file  Occupational History   Not on file  Tobacco Use   Smoking status: Never    Smokeless tobacco: Never  Vaping Use   Vaping Use: Never used  Substance and Sexual Activity   Alcohol use: No   Drug use: No   Sexual activity: Not Currently    Birth control/protection: Post-menopausal  Other Topics Concern   Not on file  Social History Narrative   Lives in [place], [place]. 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   Social Determinants of Health   Financial Resource Strain: Not on file  Food Insecurity: Not on file  Transportation Needs: Not on file  Physical Activity: Not on file  Stress: Not on file  Social Connections: Not on file     Family History:  The patient's   family history includes Asthma in her brother; Cancer in her brother, father, and mother; Diabetes in her brother.   ROS:   Please see the history of present illness.    ROS All other systems reviewed and are negative.   PHYSICAL EXAM:   VS:  BP (!) 144/66 Comment: didn't take meds this am  Pulse 78   Ht 5\' 4"  (1.626 m)   Wt 268 lb (121.6 kg)   SpO2 95%   BMI 46.00 kg/m   Physical Exam  GEN: Obese, in no acute distress  Neck: no JVD, carotid bruits, or masses Cardiac:RRR; 2/6 systolic murmur the left sternal border Respiratory:  clear to auscultation bilaterally, normal work of breathing GI: soft, nontender, nondistended, + BS Ext: Trace of ankle edema without cyanosis, clubbing,  Good distal pulses bilaterally Neuro:  Alert and Oriented x 3 Psych: euthymic mood, full affect  Wt Readings from Last 3 Encounters:  12/21/20 268 lb (121.6 kg)  08/11/20 264 lb (119.7 kg)  07/12/20 265 lb 3.4 oz (120.3 kg)      Studies/Labs Reviewed:   EKG:  EKG is not ordered today.     Recent Labs: 07/12/2020: BUN 20; Creatinine, Ser 1.20; Hemoglobin 10.6; Platelets 233; Potassium 4.6; Sodium 138 07/13/2020: TSH 0.149   Lipid Panel    Component Value Date/Time   CHOL 144 12/08/2017 0711   TRIG 88 10/23/2018 1251   HDL 42 (L) 12/08/2017 0711    CHOLHDL 3.4 12/08/2017 0711   LDLCALC 69 12/08/2017 0711    Additional studies/ records that were reviewed today include:  2D echo 06/24/2020 IMPRESSIONS     1. Left ventricular ejection fraction, by estimation, is 60 to 65%. The  left ventricle has normal function. The left ventricle has no regional  wall motion abnormalities.   2. Right ventricular systolic function is normal. The right ventricular  size is normal. Tricuspid regurgitation signal is inadequate for assessing  PA pressure.   3. Fluid collection posterior to left venticle is most consistent with  pleural effusion.   4. The mitral valve is grossly normal. Trivial mitral valve  regurgitation.   5. The aortic valve has an indeterminant number of cusps. There is  moderate calcification of the aortic valve. Aortic valve regurgitation is  not visualized. Mild to moderate aortic valve stenosis. Aortic valve mean  gradient measures 18.5 mmHg. Aortic  valve Vmax measures 2.94 m/s. Dimentionless index 0.41.   6. The inferior vena cava is normal in size with greater than 50%  respiratory variability, suggesting right atrial pressure of 3 mmHg.      Echo 04/12/17 Study Conclusions   - Left ventricle: The cavity size was normal. Wall thickness was    increased in a pattern of mild LVH. Systolic function was normal.    The estimated ejection fraction was in the range of 60% to 65%.    Wall motion was normal; there were no regional wall motion    abnormalities. Doppler parameters are consistent with abnormal    left ventricular relaxation (grade 1 diastolic dysfunction).    Doppler parameters are consistent with high ventricular filling    pressure.  - Aortic valve: Trileaflet; mildly thickened, mildly calcified    leaflets. There was mild stenosis. Peak velocity (S): 248 cm/s.    Mean gradient (S): 13 mm Hg.  NUC study  No diagnostic ST segment changes to indicate ischemia. Small, mild intensity, mid to apical anteroseptal  defect that is fixed, more  prominent on rest imaging and suggestive of soft tissue attenuation. Medium, mild intensity, inferoseptal and inferolateral defect that partially reversible in the mid to apical distribution and fixed at the base suggestive of scar with mild to moderate peri-infarct ischemia. This is an intermediate risk study. Nuclear stress EF: 58%.       Risk Assessment/Calculations:         ASSESSMENT:    1. Coronary artery disease involving native coronary artery of native heart without angina pectoris   2. Aortic valve stenosis, etiology of cardiac valve disease unspecified   3. Essential hypertension   4. Hyperlipidemia, unspecified hyperlipidemia type   5. Type 2 diabetes mellitus with other specified complication, without long-term current use of insulin (HCC)   6. Obesity, Class III, BMI 40-49.9 (morbid obesity) (HCC)      PLAN:  In order of problems listed above:  Presumed CAD with abnormal nuclear stress test 2019 treated medically because asymptomatic-no chest pain.  Very inactive because of arthritis.  Chronic dyspnea on exertion unchanged.   Aortic stenosis mild to moderate aortic stenosis on echo 06/24/2020, mean gradient 18.5 mmHg.  Chronic dyspnea on exertion unchanged.  We will continue with yearly surveillance.   Hypertension blood pressure well controlled, check labs today   HLD LDL 27 on 06/11/2020 on atorvastatin, fasting so will check labs today.   IDDM A1c 7.6 06/2020.  Managed by PCP.  Obesity-weight loss discussed but patient not interested in weight watchers or other plans.       Shared Decision Making/Informed Consent        Medication Adjustments/Labs and Tests Ordered: Current medicines are reviewed at length with the patient today.  Concerns regarding medicines are outlined above.  Medication changes, Labs and Tests ordered today are listed in the Patient Instructions below. Patient Instructions  Medication Instructions:  Your  physician recommends that you continue on your current medications as directed. Please refer to the Current Medication list given to you today.  *If you need a refill on your cardiac medications before your next appointment, please call your pharmacy*   Lab Work: Cbc,cmet,lipids today  If you have labs (blood work) drawn today and your tests are completely normal, you will receive your results only by: [place] (if you have MyChart) OR A paper copy in the mail If you have any lab test that is abnormal or we need to change your treatment, we will call you to review the results.   Testing/Procedures: None today    Follow-Up: At [facility], you and your health needs are our priority.  As part of our continuing mission to provide you with exceptional heart care, we have created designated Provider Care Teams.  These Care Teams include your primary Cardiologist (physician) and Advanced Practice Providers (APPs -  Physician Assistants and Nurse Practitioners) who all work together to provide you with the care you need, when you need it.  We recommend signing up for the patient portal called "MyChart".  Sign up information is provided on this After Visit Summary.  MyChart is used to connect with patients for Virtual Visits (Telemedicine).  Patients are able to view lab/test results, encounter notes, upcoming appointments, etc.  Non-urgent messages can be sent to your provider as well.   To learn more about what you can do with MyChart, go to [email or web address].    Your next appointment:   6 month(s)  The format for your next appointment:   In Person  Provider:   You may  see Cardiologist     Other Instructions  Two Gram Sodium Diet 2000 mg  What is Sodium? Sodium is a mineral found naturally in many foods. The most significant source of sodium in the diet is table salt, which is about 40% sodium.  Processed, convenience, and preserved foods also contain a large amount  of sodium.  The body needs only 500 mg of sodium daily to function,  A normal diet provides more than enough sodium even if you do not use salt.  Why Limit Sodium? A build up of sodium in the body can cause thirst, increased blood pressure, shortness of breath, and water retention.  Decreasing sodium in the diet can reduce edema and risk of heart attack or stroke associated with high blood pressure.  Keep in mind that there are many other factors involved in these health problems.  Heredity, obesity, lack of exercise, cigarette smoking, stress and what you eat all play a role.  General Guidelines: Do not add salt at the table or in cooking.  One teaspoon of salt contains over 2 grams of sodium. Read food labels Avoid processed and convenience foods Ask your dietitian before eating any foods not dicussed in the menu planning guidelines Consult your physician if you wish to use a salt substitute or a sodium containing medication such as antacids.  Limit milk and milk products to 16 oz (2 cups) per day.  Shopping Hints: READ LABELS!! "Dietetic" does not necessarily mean low sodium. Salt and other sodium ingredients are often added to foods during processing.    Menu Planning Guidelines Food Group Choose More Often Avoid  Beverages (see also the milk group All fruit juices, low-sodium, salt-free vegetables juices, low-sodium carbonated beverages Regular vegetable or tomato juices, commercially softened water used for drinking or cooking  Breads and Cereals Enriched white, wheat, rye and pumpernickel bread, hard rolls and dinner rolls; muffins, cornbread and waffles; most dry cereals, cooked cereal without added salt; unsalted crackers and breadsticks; low sodium or homemade bread crumbs Bread, rolls and crackers with salted tops; quick breads; instant hot cereals; pancakes; commercial bread stuffing; self-rising flower and biscuit mixes; regular bread crumbs or cracker crumbs  Desserts and Sweets  Desserts and sweets mad with mild should be within allowance Instant pudding mixes and cake mixes  Fats Butter or margarine; vegetable oils; unsalted salad dressings, regular salad dressings limited to 1 Tbs; light, sour and heavy cream Regular salad dressings containing bacon fat, bacon bits, and salt pork; snack dips made with instant soup mixes or processed cheese; salted nuts  Fruits Most fresh, frozen and canned fruits Fruits processed with salt or sodium-containing ingredient (some dried fruits are processed with sodium sulfites        Vegetables Fresh, frozen vegetables and low- sodium canned vegetables Regular canned vegetables, sauerkraut, pickled vegetables, and others prepared in brine; frozen vegetables in sauces; vegetables seasoned with ham, bacon or salt pork  Condiments, Sauces, Miscellaneous  Salt substitute with physician's approval; pepper, herbs, spices; vinegar, lemon or lime juice; hot pepper sauce; garlic powder, onion powder, low sodium soy sauce (1 Tbs.); low sodium condiments (ketchup, chili sauce, mustard) in limited amounts (1 tsp.) fresh ground horseradish; unsalted tortilla chips, pretzels, potato chips, popcorn, salsa (1/4 cup) Any seasoning made with salt including garlic salt, celery salt, onion salt, and seasoned salt; sea salt, rock salt, kosher salt; meat tenderizers; monosodium glutamate; mustard, regular soy sauce, barbecue, sauce, chili sauce, teriyaki sauce, steak sauce, Worcestershire sauce, and most flavored vinegars;  canned gravy and mixes; regular condiments; salted snack foods, olives, picles, relish, horseradish sauce, catsup   Food preparation: Try these seasonings Meats:    Pork Sage, onion Serve with applesauce  Chicken Poultry seasoning, thyme, parsley Serve with cranberry sauce  Lamb Curry powder, rosemary, garlic, thyme Serve with mint sauce or jelly  Veal Marjoram, basil Serve with current jelly, cranberry sauce  Beef Pepper, bay leaf Serve with  dry mustard, unsalted chive butter  [place] leaf, dill Serve with unsalted lemon butter, unsalted parsley butter  Vegetables:    Asparagus Lemon juice   Broccoli Lemon juice   Carrots Mustard dressing parsley, mint, nutmeg, glazed with unsalted butter and sugar   Green beans Marjoram, lemon juice, nutmeg,dill seed   Tomatoes Basil, marjoram, onion   Spice /blend for [employer]" 4 tsp ground thyme 1 tsp ground sage 3 tsp ground rosemary 4 tsp ground marjoram   Test your knowledge A product that says "Salt Free" may still contain sodium. True or False Garlic Powder and Hot Pepper Sauce an be used as alternative seasonings.True or False Processed foods have more sodium than fresh foods.  True or False Canned Vegetables have less sodium than froze True or False   WAYS TO DECREASE YOUR SODIUM INTAKE Avoid the use of added salt in cooking and at the table.  Table salt (and other prepared seasonings which contain salt) is probably one of the greatest sources of sodium in the diet.  Unsalted foods can gain flavor from the sweet, sour, and butter taste sensations of herbs and spices.  Instead of using salt for seasoning, try the following seasonings with the foods listed.  Remember: how you use them to enhance natural food flavors is limited only by your creativity... Allspice-Meat, fish, eggs, fruit, peas, red and yellow vegetables Almond Extract-Fruit baked goods Anise Seed-Sweet breads, fruit, carrots, beets, cottage cheese, cookies (tastes like licorice) Basil-Meat, fish, eggs, vegetables, rice, vegetables salads, soups, sauces Bay Leaf-Meat, fish, stews, poultry Burnet-Salad, vegetables (cucumber-like flavor) Caraway Seed-Bread, cookies, cottage cheese, meat, vegetables, cheese, rice Cardamon-Baked goods, fruit, soups Celery Powder or seed-Salads, salad dressings, sauces, meatloaf, soup, bread.Do not use  celery salt Chervil-Meats, salads, fish, eggs, vegetables, cottage cheese  (parsley-like flavor) Chili Power-Meatloaf, chicken cheese, corn, eggplant, egg dishes Chives-Salads cottage cheese, egg dishes, soups, vegetables, sauces Cilantro-Salsa, casseroles Cinnamon-Baked goods, fruit, pork, lamb, chicken, carrots Cloves-Fruit, baked goods, fish, pot roast, green beans, beets, carrots Coriander-Pastry, cookies, meat, salads, cheese (lemon-orange flavor) Cumin-Meatloaf, fish,cheese, eggs, cabbage,fruit pie (caraway flavor) [employer], fruit, eggs, fish, poultry, cottage cheese, vegetables Dill Seed-Meat, cottage cheese, poultry, vegetables, fish, salads, bread Fennel Seed-Bread, cookies, apples, pork, eggs, fish, beets, cabbage, cheese, Licorice-like flavor Garlic-(buds or powder) Salads, meat, poultry, fish, bread, butter, vegetables, potatoes.Do not  use garlic salt Ginger-Fruit, vegetables, baked goods, meat, fish, poultry Horseradish Root-Meet, vegetables, butter Lemon Juice or Extract-Vegetables, fruit, tea, baked goods, fish salads Mace-Baked goods fruit, vegetables, fish, poultry (taste like nutmeg) Maple Extract-Syrups Marjoram-Meat, chicken, fish, vegetables, breads, green salads (taste like Sage) Mint-Tea, lamb, sherbet, vegetables, desserts, carrots, cabbage Mustard, Dry or Seed-Cheese, eggs, meats, vegetables, poultry Nutmeg-Baked goods, fruit, chicken, eggs, vegetables, desserts Onion Powder-Meat, fish, poultry, vegetables, cheese, eggs, bread, rice salads (Do not use   Onion salt) Orange Extract-Desserts, baked goods Oregano-Pasta, eggs, cheese, onions, pork, lamb, fish, chicken, vegetables, green salads Paprika-Meat, fish, poultry, eggs, cheese, vegetables Parsley Flakes-Butter, vegetables, meat fish, poultry, eggs, bread, salads (certain forms may   Contain sodium Pepper-Meat fish, poultry, vegetables,  eggs Peppermint Extract-Desserts, baked goods Poppy Seed-Eggs, bread, cheese, fruit dressings, baked goods, noodles, vegetables, cottage   [employer], poultry, meat, fish, cauliflower, turnips,eggs bread Saffron-Rice, bread, veal, chicken, fish, eggs Sage-Meat, fish, poultry, onions, eggplant, tomateos, pork, stews Savory-Eggs, salads, poultry, meat, rice, vegetables, soups, pork Tarragon-Meat, poultry, fish, eggs, butter, vegetables (licorice-like flavor)  Thyme-Meat, poultry, fish, eggs, vegetables, (clover-like flavor), sauces, soups Tumeric-Salads, butter, eggs, fish, rice, vegetables (saffron-like flavor) Vanilla Extract-Baked goods, candy Vinegar-Salads, vegetables, meat marinades Walnut Extract-baked goods, candy   2. Choose your Foods Wisely   The following is a list of foods to avoid which are high in sodium:  Meats-Avoid all smoked, canned, salt cured, dried and kosher meat and fish as well as Anchovies   [employer] meats:Bologna, Liverwurst, Pastrami Canned meat or fish  Marinated herring Caviar    Pepperoni Corned Beef   Pizza Dried chipped beef  Salami Frozen breaded fish or meat Salt pork Frankfurters or hot dogs  Sardines Gefilte fish   Sausage Ham (boiled ham, Proscuitto Smoked butt    spiced ham)   Spam      TV Dinners Vegetables Canned vegetables (Regular) Relish Canned mushrooms  Sauerkraut Olives    Tomato juice Pickles  Bakery and Dessert Products Canned puddings  Cream pies Cheesecake   Decorated cakes Cookies  Beverages/Juices Tomato juice, regular  Gatorade   V-8 vegetable juice, regular  Breads and Cereals Biscuit mixes   Salted potato chips, corn chips, pretzels Bread stuffing mixes  Salted crackers and rolls Pancake and waffle mixes Self-rising flour  Seasonings Accent    Meat sauces Barbecue sauce  Meat tenderizer Catsup    Monosodium glutamate (MSG) Celery salt   Onion salt Chili sauce   Prepared mustard Garlic salt   Salt, seasoned salt, sea salt Gravy mixes   Soy sauce Horseradish   Steak sauce Ketchup   Tartar  sauce Lite salt    Teriyaki sauce Marinade mixes   Worcestershire sauce  Others Baking powder   Cocoa and cocoa mixes Baking soda   Commercial casserole mixes Candy-caramels, chocolate  Dehydrated soups    Bars, fudge,nougats  Instant rice and pasta mixes Canned broth or soup  Maraschino cherries Cheese, aged and processed cheese and cheese spreads  Learning Assessment Quiz  Indicated T (for True) or F (for False) for each of the following statements:  _____ Fresh fruits and vegetables and unprocessed grains are generally low in sodium _____ Water may contain a considerable amount of sodium, depending on the source _____ You can always tell if a food is high in sodium by tasting it _____ Certain laxatives my be high in sodium and should be avoided unless prescribed   by a physician or pharmacist _____ Salt substitutes may be used freely by anyone on a sodium restricted diet _____ Sodium is present in table salt, food additives and as a natural component of   most foods _____ Table salt is approximately 90% sodium _____ Limiting sodium intake may help prevent excess fluid accumulation in the body _____ On a sodium-restricted diet, seasonings such as bouillon soy sauce, and    cooking wine should be used in place of table salt _____ On an ingredient list, a product which lists monosodium glutamate as the first   ingredient is an appropriate food to include on a low sodium diet  Circle the best answer(s) to the following statements (Hint: there may be more than one correct answer)  11.  On a low-sodium diet, some acceptable snack items are:    A. Olives  F. Bean dip   K. Grapefruit juice    B. Salted Pretzels G. Commercial Popcorn   L. Canned peaches    C. Carrot Sticks  H. Bouillon   M. Unsalted nuts   D. [place] fries  I. Peanut butter crackers N. Salami   E. Sweet pickles J. Tomato Juice   O. Pizza  12.  Seasonings that may be used freely on a reduced - sodium diet  include   A. Lemon wedges F.Monosodium glutamate K. Celery seed    B.Soysauce   G. Pepper   L. Mustard powder   C. Sea salt  H. Cooking wine  M. Onion flakes   D. Vinegar  E. Prepared horseradish N. Salsa   E. Sage   J. Worcestershire sauce  O. [address]     Sumner Boast, PA-C  12/21/2020 11:27 AM    [facility], [place], [place]  [address] Phone: [phone]; Fax: [phone]

## 2020-12-21 ENCOUNTER — Ambulatory Visit (INDEPENDENT_AMBULATORY_CARE_PROVIDER_SITE_OTHER): Payer: Medicare Other | Admitting: Physician Assistant

## 2020-12-21 ENCOUNTER — Encounter: Payer: Self-pay | Admitting: Physician Assistant

## 2020-12-21 ENCOUNTER — Other Ambulatory Visit (HOSPITAL_COMMUNITY)
Admission: RE | Admit: 2020-12-21 | Discharge: 2020-12-21 | Disposition: A | Discharge status: Home / Self Care | Payer: Medicare Other | Source: Ambulatory Visit | Attending: Physician Assistant | Admitting: Physician Assistant

## 2020-12-21 ENCOUNTER — Other Ambulatory Visit: Payer: Self-pay

## 2020-12-21 VITALS — BP 144/66 | HR 78 | Ht 64.0 in | Wt 268.0 lb

## 2020-12-21 DIAGNOSIS — E1169 Type 2 diabetes mellitus with other specified complication: Secondary | ICD-10-CM | POA: Insufficient documentation

## 2020-12-21 DIAGNOSIS — I10 Essential (primary) hypertension: Secondary | ICD-10-CM | POA: Insufficient documentation

## 2020-12-21 DIAGNOSIS — E785 Hyperlipidemia, unspecified: Secondary | ICD-10-CM

## 2020-12-21 DIAGNOSIS — I350 Nonrheumatic aortic (valve) stenosis: Secondary | ICD-10-CM

## 2020-12-21 DIAGNOSIS — I2510 Atherosclerotic heart disease of native coronary artery without angina pectoris: Secondary | ICD-10-CM | POA: Diagnosis not present

## 2020-12-21 LAB — COMPREHENSIVE METABOLIC PANEL
ALT: 17 U/L (ref 0–44)
AST: 15 U/L (ref 15–41)
Albumin: 3.9 g/dL (ref 3.5–5.0)
Alkaline Phosphatase: 84 U/L (ref 38–126)
Anion gap: 6 (ref 5–15)
BUN: 25 mg/dL — ABNORMAL HIGH (ref 8–23)
CO2: 24 mmol/L (ref 22–32)
Calcium: 9 mg/dL (ref 8.9–10.3)
Chloride: 107 mmol/L (ref 98–111)
Creatinine, Ser: 1.13 mg/dL — ABNORMAL HIGH (ref 0.44–1.00)
GFR, Estimated: 53 mL/min — ABNORMAL LOW (ref >60)
Glucose, Bld: 107 mg/dL — ABNORMAL HIGH (ref 70–99)
Potassium: 4.5 mmol/L (ref 3.5–5.1)
Sodium: 137 mmol/L (ref 135–145)
Total Bilirubin: 0.6 mg/dL (ref 0.3–1.2)
Total Protein: 7.6 g/dL (ref 6.5–8.1)

## 2020-12-21 LAB — CBC
HCT: 38.2 % (ref 36.0–46.0)
Hemoglobin: 11.6 g/dL — ABNORMAL LOW (ref 12.0–15.0)
MCH: 28 pg (ref 26.0–34.0)
MCHC: 30.4 g/dL (ref 30.0–36.0)
MCV: 92 fL (ref 80.0–100.0)
Platelets: 271 10*3/uL (ref 150–400)
RBC: 4.15 MIL/uL (ref 3.87–5.11)
RDW: 13.4 % (ref 11.5–15.5)
WBC: 5.2 10*3/uL (ref 4.0–10.5)
nRBC: 0 % (ref 0.0–0.2)

## 2020-12-21 LAB — LIPID PANEL
Cholesterol: 97 mg/dL (ref 0–200)
HDL: 37 mg/dL — ABNORMAL LOW (ref >40)
LDL Cholesterol: 35 mg/dL (ref 0–99)
Total CHOL/HDL Ratio: 2.6 RATIO
Triglycerides: 125 mg/dL (ref <150)
VLDL: 25 mg/dL (ref 0–40)

## 2020-12-21 NOTE — Patient Instructions (Addendum)
Medication Instructions:  Your physician recommends that you continue on your current medications as directed. Please refer to the Current Medication list given to you today.  *If you need a refill on your cardiac medications before your next appointment, please call your pharmacy*   Lab Work: Cbc,cmet,lipids today  If you have labs (blood work) drawn today and your tests are completely normal, you will receive your results only by: [place] (if you have MyChart) OR A paper copy in the mail If you have any lab test that is abnormal or we need to change your treatment, we will call you to review the results.   Testing/Procedures: None today    Follow-Up: At [facility], you and your health needs are our priority.  As part of our continuing mission to provide you with exceptional heart care, we have created designated Provider Care Teams.  These Care Teams include your primary Cardiologist (physician) and Advanced Practice Providers (APPs -  Physician Assistants and Nurse Practitioners) who all work together to provide you with the care you need, when you need it.  We recommend signing up for the patient portal called "MyChart".  Sign up information is provided on this After Visit Summary.  MyChart is used to connect with patients for Virtual Visits (Telemedicine).  Patients are able to view lab/test results, encounter notes, upcoming appointments, etc.  Non-urgent messages can be sent to your provider as well.   To learn more about what you can do with MyChart, go to [email or web address].    Your next appointment:   6 month(s)  The format for your next appointment:   In Person  Provider:   You may see Cardiologist     Other Instructions  Two Gram Sodium Diet 2000 mg  What is Sodium? Sodium is a mineral found naturally in many foods. The most significant source of sodium in the diet is table salt, which is about 40% sodium.  Processed, convenience, and preserved  foods also contain a large amount of sodium.  The body needs only 500 mg of sodium daily to function,  A normal diet provides more than enough sodium even if you do not use salt.  Why Limit Sodium? A build up of sodium in the body can cause thirst, increased blood pressure, shortness of breath, and water retention.  Decreasing sodium in the diet can reduce edema and risk of heart attack or stroke associated with high blood pressure.  Keep in mind that there are many other factors involved in these health problems.  Heredity, obesity, lack of exercise, cigarette smoking, stress and what you eat all play a role.  General Guidelines: Do not add salt at the table or in cooking.  One teaspoon of salt contains over 2 grams of sodium. Read food labels Avoid processed and convenience foods Ask your dietitian before eating any foods not dicussed in the menu planning guidelines Consult your physician if you wish to use a salt substitute or a sodium containing medication such as antacids.  Limit milk and milk products to 16 oz (2 cups) per day.  Shopping Hints: READ LABELS!! "Dietetic" does not necessarily mean low sodium. Salt and other sodium ingredients are often added to foods during processing.    Menu Planning Guidelines Food Group Choose More Often Avoid  Beverages (see also the milk group All fruit juices, low-sodium, salt-free vegetables juices, low-sodium carbonated beverages Regular vegetable or tomato juices, commercially softened water used for drinking or cooking  Breads and Cereals Enriched  white, wheat, rye and pumpernickel bread, hard rolls and dinner rolls; muffins, cornbread and waffles; most dry cereals, cooked cereal without added salt; unsalted crackers and breadsticks; low sodium or homemade bread crumbs Bread, rolls and crackers with salted tops; quick breads; instant hot cereals; pancakes; commercial bread stuffing; self-rising flower and biscuit mixes; regular bread crumbs or  cracker crumbs  Desserts and Sweets Desserts and sweets mad with mild should be within allowance Instant pudding mixes and cake mixes  Fats Butter or margarine; vegetable oils; unsalted salad dressings, regular salad dressings limited to 1 Tbs; light, sour and heavy cream Regular salad dressings containing bacon fat, bacon bits, and salt pork; snack dips made with instant soup mixes or processed cheese; salted nuts  Fruits Most fresh, frozen and canned fruits Fruits processed with salt or sodium-containing ingredient (some dried fruits are processed with sodium sulfites        Vegetables Fresh, frozen vegetables and low- sodium canned vegetables Regular canned vegetables, sauerkraut, pickled vegetables, and others prepared in brine; frozen vegetables in sauces; vegetables seasoned with ham, bacon or salt pork  Condiments, Sauces, Miscellaneous  Salt substitute with physician's approval; pepper, herbs, spices; vinegar, lemon or lime juice; hot pepper sauce; garlic powder, onion powder, low sodium soy sauce (1 Tbs.); low sodium condiments (ketchup, chili sauce, mustard) in limited amounts (1 tsp.) fresh ground horseradish; unsalted tortilla chips, pretzels, potato chips, popcorn, salsa (1/4 cup) Any seasoning made with salt including garlic salt, celery salt, onion salt, and seasoned salt; sea salt, rock salt, kosher salt; meat tenderizers; monosodium glutamate; mustard, regular soy sauce, barbecue, sauce, chili sauce, teriyaki sauce, steak sauce, Worcestershire sauce, and most flavored vinegars; canned gravy and mixes; regular condiments; salted snack foods, olives, picles, relish, horseradish sauce, catsup   Food preparation: Try these seasonings Meats:    Pork Sage, onion Serve with applesauce  Chicken Poultry seasoning, thyme, parsley Serve with cranberry sauce  Lamb Curry powder, rosemary, garlic, thyme Serve with mint sauce or jelly  Veal Marjoram, basil Serve with current jelly, cranberry  sauce  Beef Pepper, bay leaf Serve with dry mustard, unsalted chive butter  Fish [place] leaf, dill Serve with unsalted lemon butter, unsalted parsley butter  Vegetables:    Asparagus Lemon juice   Broccoli Lemon juice   Carrots Mustard dressing parsley, mint, nutmeg, glazed with unsalted butter and sugar   Green beans Marjoram, lemon juice, nutmeg,dill seed   Tomatoes Basil, marjoram, onion   Spice /blend for [employer]" 4 tsp ground thyme 1 tsp ground sage 3 tsp ground rosemary 4 tsp ground marjoram   Test your knowledge A product that says "Salt Free" may still contain sodium. True or False Garlic Powder and Hot Pepper Sauce an be used as alternative seasonings.True or False Processed foods have more sodium than fresh foods.  True or False Canned Vegetables have less sodium than froze True or False   WAYS TO DECREASE YOUR SODIUM INTAKE Avoid the use of added salt in cooking and at the table.  Table salt (and other prepared seasonings which contain salt) is probably one of the greatest sources of sodium in the diet.  Unsalted foods can gain flavor from the sweet, sour, and butter taste sensations of herbs and spices.  Instead of using salt for seasoning, try the following seasonings with the foods listed.  Remember: how you use them to enhance natural food flavors is limited only by your creativity... Allspice-Meat, fish, eggs, fruit, peas, red and yellow vegetables Almond Extract-Fruit  baked goods Anise Seed-Sweet breads, fruit, carrots, beets, cottage cheese, cookies (tastes like licorice) Basil-Meat, fish, eggs, vegetables, rice, vegetables salads, soups, sauces Bay Leaf-Meat, fish, stews, poultry Burnet-Salad, vegetables (cucumber-like flavor) Caraway Seed-Bread, cookies, cottage cheese, meat, vegetables, cheese, rice Cardamon-Baked goods, fruit, soups Celery Powder or seed-Salads, salad dressings, sauces, meatloaf, soup, bread.Do not use  celery salt Chervil-Meats, salads, fish,  eggs, vegetables, cottage cheese (parsley-like flavor) Chili Power-Meatloaf, chicken cheese, corn, eggplant, egg dishes Chives-Salads cottage cheese, egg dishes, soups, vegetables, sauces Cilantro-Salsa, casseroles Cinnamon-Baked goods, fruit, pork, lamb, chicken, carrots Cloves-Fruit, baked goods, fish, pot roast, green beans, beets, carrots Coriander-Pastry, cookies, meat, salads, cheese (lemon-orange flavor) Cumin-Meatloaf, fish,cheese, eggs, cabbage,fruit pie (caraway flavor) [employer], fruit, eggs, fish, poultry, cottage cheese, vegetables Dill Seed-Meat, cottage cheese, poultry, vegetables, fish, salads, bread Fennel Seed-Bread, cookies, apples, pork, eggs, fish, beets, cabbage, cheese, Licorice-like flavor Garlic-(buds or powder) Salads, meat, poultry, fish, bread, butter, vegetables, potatoes.Do not  use garlic salt Ginger-Fruit, vegetables, baked goods, meat, fish, poultry Horseradish Root-Meet, vegetables, butter Lemon Juice or Extract-Vegetables, fruit, tea, baked goods, fish salads Mace-Baked goods fruit, vegetables, fish, poultry (taste like nutmeg) Maple Extract-Syrups Marjoram-Meat, chicken, fish, vegetables, breads, green salads (taste like Sage) Mint-Tea, lamb, sherbet, vegetables, desserts, carrots, cabbage Mustard, Dry or Seed-Cheese, eggs, meats, vegetables, poultry Nutmeg-Baked goods, fruit, chicken, eggs, vegetables, desserts Onion Powder-Meat, fish, poultry, vegetables, cheese, eggs, bread, rice salads (Do not use   Onion salt) Orange Extract-Desserts, baked goods Oregano-Pasta, eggs, cheese, onions, pork, lamb, fish, chicken, vegetables, green salads Paprika-Meat, fish, poultry, eggs, cheese, vegetables Parsley Flakes-Butter, vegetables, meat fish, poultry, eggs, bread, salads (certain forms may   Contain sodium Pepper-Meat fish, poultry, vegetables, eggs Peppermint Extract-Desserts, baked goods Poppy Seed-Eggs, bread, cheese, fruit dressings, baked  goods, noodles, vegetables, cottage  [employer], poultry, meat, fish, cauliflower, turnips,eggs bread Saffron-Rice, bread, veal, chicken, fish, eggs Sage-Meat, fish, poultry, onions, eggplant, tomateos, pork, stews Savory-Eggs, salads, poultry, meat, rice, vegetables, soups, pork Tarragon-Meat, poultry, fish, eggs, butter, vegetables (licorice-like flavor)  Thyme-Meat, poultry, fish, eggs, vegetables, (clover-like flavor), sauces, soups Tumeric-Salads, butter, eggs, fish, rice, vegetables (saffron-like flavor) Vanilla Extract-Baked goods, candy Vinegar-Salads, vegetables, meat marinades Walnut Extract-baked goods, candy   2. Choose your Foods Wisely   The following is a list of foods to avoid which are high in sodium:  Meats-Avoid all smoked, canned, salt cured, dried and kosher meat and fish as well as Anchovies   [employer] meats:Bologna, Liverwurst, Pastrami Canned meat or fish  Marinated herring Caviar    Pepperoni Corned Beef   Pizza Dried chipped beef  Salami Frozen breaded fish or meat Salt pork Frankfurters or hot dogs  Sardines Gefilte fish   Sausage Ham (boiled ham, Proscuitto Smoked butt    spiced ham)   Spam      TV Dinners Vegetables Canned vegetables (Regular) Relish Canned mushrooms  Sauerkraut Olives    Tomato juice Pickles  Bakery and Dessert Products Canned puddings  Cream pies Cheesecake   Decorated cakes Cookies  Beverages/Juices Tomato juice, regular  Gatorade   V-8 vegetable juice, regular  Breads and Cereals Biscuit mixes   Salted potato chips, corn chips, pretzels Bread stuffing mixes  Salted crackers and rolls Pancake and waffle mixes Self-rising flour  Seasonings Accent    Meat sauces Barbecue sauce  Meat tenderizer Catsup    Monosodium glutamate (MSG) Celery salt   Onion salt Chili sauce   Prepared mustard Garlic salt   Salt, seasoned salt, sea  salt Gravy mixes   Soy  sauce Horseradish   Steak sauce Ketchup   Tartar sauce Lite salt    Teriyaki sauce Marinade mixes   Worcestershire sauce  Others Baking powder   Cocoa and cocoa mixes Baking soda   Commercial casserole mixes Candy-caramels, chocolate  Dehydrated soups    Bars, fudge,nougats  Instant rice and pasta mixes Canned broth or soup  Maraschino cherries Cheese, aged and processed cheese and cheese spreads  Learning Assessment Quiz  Indicated T (for True) or F (for False) for each of the following statements:  _____ Fresh fruits and vegetables and unprocessed grains are generally low in sodium _____ Water may contain a considerable amount of sodium, depending on the source _____ You can always tell if a food is high in sodium by tasting it _____ Certain laxatives my be high in sodium and should be avoided unless prescribed   by a physician or pharmacist _____ Salt substitutes may be used freely by anyone on a sodium restricted diet _____ Sodium is present in table salt, food additives and as a natural component of   most foods _____ Table salt is approximately 90% sodium _____ Limiting sodium intake may help prevent excess fluid accumulation in the body _____ On a sodium-restricted diet, seasonings such as bouillon soy sauce, and    cooking wine should be used in place of table salt _____ On an ingredient list, a product which lists monosodium glutamate as the first   ingredient is an appropriate food to include on a low sodium diet  Circle the best answer(s) to the following statements (Hint: there may be more than one correct answer)  11. On a low-sodium diet, some acceptable snack items are:    A. Olives  F. Bean dip   K. Grapefruit juice    B. Salted Pretzels G. Commercial Popcorn   L. Canned peaches    C. Carrot Sticks  H. Bouillon   M. Unsalted nuts   D. [place] fries  I. Peanut butter crackers N. Salami   E. Sweet pickles J. Tomato Juice   O. Pizza  12.  Seasonings that may be  used freely on a reduced - sodium diet include   A. Lemon wedges F.Monosodium glutamate K. Celery seed    B.Soysauce   G. Pepper   L. Mustard powder   C. Sea salt  H. Cooking wine  M. Onion flakes   D. Vinegar  E. Prepared horseradish N. Salsa   E. Sage   J. Worcestershire sauce  O. [place]

## 2020-12-22 ENCOUNTER — Telehealth: Payer: Self-pay

## 2020-12-22 NOTE — Telephone Encounter (Signed)
Patient notified of results and verbalized understanding. Pt had no questions or concerns at this time.

## 2020-12-22 NOTE — Telephone Encounter (Signed)
-----   Message from Imogene Burn, PA-C sent at 12/21/2020  3:21 PM EDT ----- Overall labs stable. No changes

## 2021-01-12 ENCOUNTER — Other Ambulatory Visit: Payer: Self-pay | Admitting: Cardiology

## 2021-01-15 DIAGNOSIS — I25118 Atherosclerotic heart disease of native coronary artery with other forms of angina pectoris: Secondary | ICD-10-CM | POA: Diagnosis not present

## 2021-01-15 DIAGNOSIS — I119 Hypertensive heart disease without heart failure: Secondary | ICD-10-CM | POA: Diagnosis not present

## 2021-01-15 DIAGNOSIS — I10 Essential (primary) hypertension: Secondary | ICD-10-CM | POA: Diagnosis not present

## 2021-01-15 DIAGNOSIS — E785 Hyperlipidemia, unspecified: Secondary | ICD-10-CM | POA: Diagnosis not present

## 2021-01-15 DIAGNOSIS — D500 Iron deficiency anemia secondary to blood loss (chronic): Secondary | ICD-10-CM | POA: Diagnosis not present

## 2021-01-15 DIAGNOSIS — E118 Type 2 diabetes mellitus with unspecified complications: Secondary | ICD-10-CM | POA: Diagnosis not present

## 2021-01-15 DIAGNOSIS — M170 Bilateral primary osteoarthritis of knee: Secondary | ICD-10-CM | POA: Diagnosis not present

## 2021-01-21 DIAGNOSIS — I10 Essential (primary) hypertension: Secondary | ICD-10-CM | POA: Diagnosis not present

## 2021-01-21 DIAGNOSIS — M170 Bilateral primary osteoarthritis of knee: Secondary | ICD-10-CM | POA: Diagnosis not present

## 2021-01-21 DIAGNOSIS — Z794 Long term (current) use of insulin: Secondary | ICD-10-CM | POA: Diagnosis not present

## 2021-01-21 DIAGNOSIS — E538 Deficiency of other specified B group vitamins: Secondary | ICD-10-CM | POA: Diagnosis not present

## 2021-01-21 DIAGNOSIS — H6121 Impacted cerumen, right ear: Secondary | ICD-10-CM | POA: Diagnosis not present

## 2021-01-21 DIAGNOSIS — E118 Type 2 diabetes mellitus with unspecified complications: Secondary | ICD-10-CM | POA: Diagnosis not present

## 2021-01-21 DIAGNOSIS — Z7984 Long term (current) use of oral hypoglycemic drugs: Secondary | ICD-10-CM | POA: Diagnosis not present

## 2021-01-21 DIAGNOSIS — I25118 Atherosclerotic heart disease of native coronary artery with other forms of angina pectoris: Secondary | ICD-10-CM | POA: Diagnosis not present

## 2021-01-21 DIAGNOSIS — E785 Hyperlipidemia, unspecified: Secondary | ICD-10-CM | POA: Diagnosis not present

## 2021-01-21 DIAGNOSIS — D649 Anemia, unspecified: Secondary | ICD-10-CM | POA: Diagnosis not present

## 2021-01-21 DIAGNOSIS — Z7985 Long-term (current) use of injectable non-insulin antidiabetic drugs: Secondary | ICD-10-CM | POA: Diagnosis not present

## 2021-01-27 ENCOUNTER — Other Ambulatory Visit: Payer: Self-pay

## 2021-01-27 ENCOUNTER — Ambulatory Visit (INDEPENDENT_AMBULATORY_CARE_PROVIDER_SITE_OTHER): Payer: Medicare Other | Admitting: Orthopaedic Surgery

## 2021-01-27 VITALS — Ht 64.0 in | Wt 266.0 lb

## 2021-01-27 DIAGNOSIS — M170 Bilateral primary osteoarthritis of knee: Secondary | ICD-10-CM | POA: Diagnosis not present

## 2021-01-27 MED ORDER — LIDOCAINE HCL 1 % IJ SOLN
2.0000 mL | INTRAMUSCULAR | Status: AC | PRN
  Administered 2021-01-27: 2 mL

## 2021-01-27 MED ORDER — METHYLPREDNISOLONE ACETATE 40 MG/ML IJ SUSP
80.0000 mg | INTRAMUSCULAR | Status: AC | PRN
  Administered 2021-01-27: 80 mg via INTRA_ARTICULAR

## 2021-01-27 NOTE — Progress Notes (Signed)
Office Visit Note   Patient: Ann Roberson           Date of Birth: January 10, 1951           MRN: [id number] Visit Date: 01/27/2021              Requested by: Caren Macadam, MD [place],   [id number] PCP: Caren Macadam, MD   Assessment & Plan: Visit Diagnoses: No diagnosis found.  Plan: Patient is a pleasant 70 year old woman with a history of bilateral knee arthritis.  She has done well with injections in the past.  Would like to go forward with injections again.  If she is a diabetic with fasting blood sugars and the low 100s.  We will start with the left knee by her choice today.  Should follow-up in 2 weeks to inject the right knee.  She understands to monitor her sugars appropriately  Follow-Up Instructions: No follow-ups on file.   Orders:  No orders of the defined types were placed in this encounter.  No orders of the defined types were placed in this encounter.     Procedures: Large Joint Inj: L knee on 01/27/2021 10:41 AM Indications: pain and diagnostic evaluation Details: 25 G 1.5 in needle, anteromedial approach  Arthrogram: No  Medications: 80 mg methylPREDNISolone acetate 40 MG/ML; 2 mL lidocaine 1 % Outcome: tolerated well, no immediate complications Procedure, treatment alternatives, risks and benefits explained, specific risks discussed. Consent was given by the patient.      Clinical Data: No additional findings.   Subjective: Chief Complaint  Patient presents with   Right Knee - Pain   Left Knee - Pain  Patient presents today for bilateral knee pain. Her last visits were in January and had both knees injected a couple weeks apart. She said that the injections help, but she always hurts. She is taking tylenol for pain. Both hurt equally the same. She uses a cane for ambulation.  HPI  Review of Systems  All other systems reviewed and are negative.   Objective: Vital Signs: There were no vitals taken for this  visit.  Physical Exam Vitals reviewed.  Constitutional:      Appearance: Normal appearance.  Eyes:     Extraocular Movements: Extraocular movements intact.  Neurological:     Mental Status: She is alert.    Ortho Exam Patient is alert pleasant to exam.  Her bilateral knees no effusion no cellulitis skin is in good condition.  She has global tenderness over both knees both medial laterally and patellofemoral with grinding.  Specialty Comments:  No specialty comments available.  Imaging: No results found.   PMFS History: Patient Active Problem List   Diagnosis Date Noted   Wheezing    Acute respiratory failure with hypoxia ([place]) 07/12/2020   Multiple thyroid nodules 07/12/2020   H/O adenomatous polyp of colon 05/18/2020   Bilateral primary osteoarthritis of knee 04/08/2020   Nausea vomiting and diarrhea 10/24/2018   COVID-19 virus infection 10/23/2018   DOE (dyspnea on exertion) 10/23/2018   Tubulovillous adenoma of colon 01/09/2018   Excessive ear wax 06/25/2017   Knee osteoarthritis 06/17/2017   Anemia in other chronic diseases classified elsewhere 06/17/2017   Type 2 diabetes mellitus with other specified complication ([place]) 44/81/8563   Upper airway cough syndrome 05/02/2017   Obesity, Class III, BMI 40-49.9 (morbid obesity) ([place]) 05/02/2017   Dyspnea on exertion 05/01/2017   Hypertension associated with diabetes ([place]) 04/06/2017   Hyperlipidemia associated with  type 2 diabetes mellitus ([place]) 04/06/2017   Urticaria 11/17/2015   Angioedema 11/17/2015   Pain in joint, lower leg 12/10/2013   Past Medical History:  Diagnosis Date   Anemia    Arthritis    Diabetes mellitus    GERD (gastroesophageal reflux disease)    Heart murmur    Hyperlipidemia    Hypertension    Neuropathy    Sarcoidosis     Family History  Problem Relation Age of Onset   Asthma Brother    Cancer Mother        stomach   Cancer Father        prostate   Cancer Brother        ?    Diabetes Brother    Allergic rhinitis Neg Hx    Angioedema Neg Hx    Atopy Neg Hx    Eczema Neg Hx    Immunodeficiency Neg Hx    Urticaria Neg Hx    Colon cancer Neg Hx     Past Surgical History:  Procedure Laterality Date   BIOPSY  02/08/2018   Procedure: BIOPSY;  Surgeon: Daneil Dolin, MD;  [facility]: [facility];  Service: Endoscopy;;   CESAREAN SECTION     CHOLECYSTECTOMY     COLONOSCOPY WITH PROPOFOL N/A 07/06/2017   Dr. Gala Romney: Diverticulosis.five 8 to 16 mm polyps in the ascending colon and in the cecum, removed with hot snare.  Largest polyp could not be removed completely, status post inking.  Pathology revealed cecal tubular adenoma, a sending colon polyps with multiple fragments of tubular adenomas, tubulovillous adenoma, no high-grade dysplasia or malignancy.  Tubular adenomas removed from the rectum as    COLONOSCOPY WITH PROPOFOL N/A 02/08/2018   12 mm tubular adenoma removed from the ileocecal valve, hyperplastic sigmoid colon polyp removed.  Possible residual polypoid tissue at the ascending colon segment previously tattooed, biopsied (tubular adenoma) and ablated with APC.  Recommend 2-year surveillance study.   COLONOSCOPY WITH PROPOFOL N/A 07/06/2020   Procedure: COLONOSCOPY WITH PROPOFOL;  Surgeon: Daneil Dolin, MD;  Location: [facility];  Service: Endoscopy;  Laterality: N/A;  am appt, diabetic. general anesthesia; intubation to control respirations. Add extra 15 min - needs to be 1st case per Melanie   MASTECTOMY, PARTIAL Left    benign   POLYPECTOMY  07/06/2017   Procedure: POLYPECTOMY;  Surgeon: Daneil Dolin, MD;  Location: [facility];  Service: Endoscopy;;  cecal polyp cs, ascending colon polyp hs   POLYPECTOMY  02/08/2018   Procedure: POLYPECTOMY;  Surgeon: Daneil Dolin, MD;  [facility]: [facility];  Service: Endoscopy;;   POLYPECTOMY  07/06/2020   Procedure: POLYPECTOMY INTESTINAL;  Surgeon: Daneil Dolin, MD;  Location: [facility];  Service:  Endoscopy;;   TUBAL LIGATION     Social History   Occupational History   Not on file  Tobacco Use   Smoking status: Never   Smokeless tobacco: Never  Vaping Use   Vaping Use: Never used  Substance and Sexual Activity   Alcohol use: No   Drug use: No   Sexual activity: Not Currently    Birth control/protection: Post-menopausal

## 2021-02-17 DIAGNOSIS — D500 Iron deficiency anemia secondary to blood loss (chronic): Secondary | ICD-10-CM | POA: Diagnosis not present

## 2021-02-17 DIAGNOSIS — I119 Hypertensive heart disease without heart failure: Secondary | ICD-10-CM | POA: Diagnosis not present

## 2021-02-17 DIAGNOSIS — M170 Bilateral primary osteoarthritis of knee: Secondary | ICD-10-CM | POA: Diagnosis not present

## 2021-02-17 DIAGNOSIS — I10 Essential (primary) hypertension: Secondary | ICD-10-CM | POA: Diagnosis not present

## 2021-02-17 DIAGNOSIS — E118 Type 2 diabetes mellitus with unspecified complications: Secondary | ICD-10-CM | POA: Diagnosis not present

## 2021-02-17 DIAGNOSIS — I25118 Atherosclerotic heart disease of native coronary artery with other forms of angina pectoris: Secondary | ICD-10-CM | POA: Diagnosis not present

## 2021-02-17 DIAGNOSIS — E785 Hyperlipidemia, unspecified: Secondary | ICD-10-CM | POA: Diagnosis not present

## 2021-02-24 ENCOUNTER — Other Ambulatory Visit: Payer: Self-pay

## 2021-02-24 ENCOUNTER — Ambulatory Visit (INDEPENDENT_AMBULATORY_CARE_PROVIDER_SITE_OTHER): Payer: Medicare Other | Admitting: Orthopaedic Surgery

## 2021-02-24 ENCOUNTER — Encounter: Payer: Self-pay | Admitting: Orthopaedic Surgery

## 2021-02-24 VITALS — Ht 64.0 in | Wt 265.0 lb

## 2021-02-24 DIAGNOSIS — M1711 Unilateral primary osteoarthritis, right knee: Secondary | ICD-10-CM | POA: Diagnosis not present

## 2021-02-24 DIAGNOSIS — M170 Bilateral primary osteoarthritis of knee: Secondary | ICD-10-CM

## 2021-02-24 MED ORDER — LIDOCAINE HCL 1 % IJ SOLN
2.0000 mL | INTRAMUSCULAR | Status: AC | PRN
  Administered 2021-02-24: 2 mL

## 2021-02-24 MED ORDER — METHYLPREDNISOLONE ACETATE 40 MG/ML IJ SUSP
80.0000 mg | INTRAMUSCULAR | Status: AC | PRN
  Administered 2021-02-24: 80 mg via INTRA_ARTICULAR

## 2021-02-24 NOTE — Progress Notes (Signed)
Office Visit Note   Patient: Ann Roberson           Date of Birth: 1950/04/15           MRN: [id number] Visit Date: 02/24/2021              Requested by: Caren Macadam, MD [place],  [place] [address] PCP: Caren Macadam, MD   Assessment & Plan: Visit Diagnoses: No diagnosis found.  Plan: Patient is a pleasant 70 year old woman who is 2 weeks status post steroid injection into her left knee.  She says this is helped her quite a bit.  She would like to have an injection now on her right knee.  Because of her history of diabetes we will elect to space her injections 2 weeks apart.  She is doing well without complaints  Follow-Up Instructions: No follow-ups on file.   Orders:  No orders of the defined types were placed in this encounter.  No orders of the defined types were placed in this encounter.     Procedures: Large Joint Inj: R knee on 02/24/2021 10:08 AM Indications: pain and diagnostic evaluation Details: 25 G 1.5 in needle, anteromedial approach  Arthrogram: No  Medications: 80 mg methylPREDNISolone acetate 40 MG/ML; 2 mL lidocaine 1 % Outcome: tolerated well, no immediate complications Procedure, treatment alternatives, risks and benefits explained, specific risks discussed. Consent was given by the patient. Immediately prior to procedure a time out was called to verify the correct patient, procedure, equipment, support staff and site/side marked as required. Patient was prepped and draped in the usual sterile fashion.      Clinical Data: No additional findings.   Subjective: Chief Complaint  Patient presents with   Right Knee - Pain   Left Knee - Follow-up  Patient returns post left knee cortisone injection on 01/27/2021. She got good relief from this injection. She is requesting injection in right knee due to right knee pain.   HPI  Review of Systems  All other systems reviewed and are negative.   Objective: Vital Signs: Ht 5'  4" (1.626 m)   Wt 265 lb (120.2 kg)   BMI 45.49 kg/m   Physical Exam Patient is appears well without complaints. Ortho Exam Left knee no effusion no cellulitis no erythema.  Tenderness over the medial lateral joint line.  She does have a history of bilateral knee arthritis Specialty Comments:  No specialty comments available.  Imaging: No results found.   PMFS History: Patient Active Problem List   Diagnosis Date Noted   Wheezing    Acute respiratory failure with hypoxia ([place]) 07/12/2020   Multiple thyroid nodules 07/12/2020   H/O adenomatous polyp of colon 05/18/2020   Bilateral primary osteoarthritis of knee 04/08/2020   Nausea vomiting and diarrhea 10/24/2018   COVID-19 virus infection 10/23/2018   DOE (dyspnea on exertion) 10/23/2018   Tubulovillous adenoma of colon 01/09/2018   Excessive ear wax 06/25/2017   Knee osteoarthritis 06/17/2017   Anemia in other chronic diseases classified elsewhere 06/17/2017   Type 2 diabetes mellitus with other specified complication ([place]) 47/65/4650   Upper airway cough syndrome 05/02/2017   Obesity, Class III, BMI 40-49.9 (morbid obesity) ([place]) 05/02/2017   Dyspnea on exertion 05/01/2017   Hypertension associated with diabetes ([place]) 04/06/2017   Hyperlipidemia associated with type 2 diabetes mellitus ([place]) 04/06/2017   Urticaria 11/17/2015   Angioedema 11/17/2015   Pain in joint, lower leg 12/10/2013   Past Medical History:  Diagnosis  Date   Anemia    Arthritis    Diabetes mellitus    GERD (gastroesophageal reflux disease)    Heart murmur    Hyperlipidemia    Hypertension    Neuropathy    Sarcoidosis     Family History  Problem Relation Age of Onset   Asthma Brother    Cancer Mother        stomach   Cancer Father        prostate   Cancer Brother        ?   Diabetes Brother    Allergic rhinitis Neg Hx    Angioedema Neg Hx    Atopy Neg Hx    Eczema Neg Hx    Immunodeficiency Neg Hx    Urticaria Neg Hx    Colon  cancer Neg Hx     Past Surgical History:  Procedure Laterality Date   BIOPSY  02/08/2018   Procedure: BIOPSY;  Surgeon: Daneil Dolin, MD;  [facility];  Service: Endoscopy;;   CESAREAN SECTION     CHOLECYSTECTOMY     COLONOSCOPY WITH PROPOFOL N/A 07/06/2017   Dr. Gala Romney: Diverticulosis.five 8 to 16 mm polyps in the ascending colon and in the cecum, removed with hot snare.  Largest polyp could not be removed completely, status post inking.  Pathology revealed cecal tubular adenoma, a sending colon polyps with multiple fragments of tubular adenomas, tubulovillous adenoma, no high-grade dysplasia or malignancy.  Tubular adenomas removed from the rectum as    COLONOSCOPY WITH PROPOFOL N/A 02/08/2018   12 mm tubular adenoma removed from the ileocecal valve, hyperplastic sigmoid colon polyp removed.  Possible residual polypoid tissue at the ascending colon segment previously tattooed, biopsied (tubular adenoma) and ablated with APC.  Recommend 2-year surveillance study.   COLONOSCOPY WITH PROPOFOL N/A 07/06/2020   Procedure: COLONOSCOPY WITH PROPOFOL;  Surgeon: Daneil Dolin, MD;  [facility];  Service: Endoscopy;  Laterality: N/A;  am appt, diabetic. general anesthesia; intubation to control respirations. Add extra 15 min - needs to be 1st case per Melanie   MASTECTOMY, PARTIAL Left    benign   POLYPECTOMY  07/06/2017   Procedure: POLYPECTOMY;  Surgeon: Daneil Dolin, MD;  [facility];  Service: Endoscopy;;  cecal polyp cs, ascending colon polyp hs   POLYPECTOMY  02/08/2018   Procedure: POLYPECTOMY;  Surgeon: Daneil Dolin, MD;  [facility];  Service: Endoscopy;;   POLYPECTOMY  07/06/2020   Procedure: POLYPECTOMY INTESTINAL;  Surgeon: Daneil Dolin, MD;  [facility];  Service: Endoscopy;;   TUBAL LIGATION     Social History   Occupational History   Not on file  Tobacco Use   Smoking status: Never   Smokeless tobacco: Never   Vaping Use   Vaping Use: Never used  Substance and Sexual Activity   Alcohol use: No   Drug use: No   Sexual activity: Not Currently    Birth control/protection: Post-menopausal

## 2021-03-04 ENCOUNTER — Encounter (HOSPITAL_COMMUNITY): Payer: Self-pay

## 2021-03-04 ENCOUNTER — Emergency Department (HOSPITAL_COMMUNITY): Payer: Medicare Other

## 2021-03-04 ENCOUNTER — Other Ambulatory Visit: Payer: Self-pay

## 2021-03-04 ENCOUNTER — Emergency Department (HOSPITAL_COMMUNITY)
Admission: EM | Admit: 2021-03-04 | Discharge: 2021-03-05 | Disposition: A | Discharge status: Home / Self Care | Payer: Medicare Other | Attending: Emergency Medicine | Admitting: Emergency Medicine

## 2021-03-04 DIAGNOSIS — I10 Essential (primary) hypertension: Secondary | ICD-10-CM | POA: Diagnosis not present

## 2021-03-04 DIAGNOSIS — Y939 Activity, unspecified: Secondary | ICD-10-CM | POA: Diagnosis not present

## 2021-03-04 DIAGNOSIS — M25552 Pain in left hip: Secondary | ICD-10-CM | POA: Diagnosis not present

## 2021-03-04 DIAGNOSIS — M7062 Trochanteric bursitis, left hip: Secondary | ICD-10-CM | POA: Insufficient documentation

## 2021-03-04 DIAGNOSIS — E119 Type 2 diabetes mellitus without complications: Secondary | ICD-10-CM | POA: Diagnosis not present

## 2021-03-04 MED ORDER — NAPROXEN 250 MG PO TABS
500.0000 mg | ORAL_TABLET | Freq: Once | ORAL | Status: AC
  Administered 2021-03-04: 500 mg via ORAL
  Filled 2021-03-04: qty 2

## 2021-03-04 NOTE — ED Provider Notes (Signed)
[facility] Emergency Department Provider Note MRN:  [id number]  Arrival date & time: 03/05/21     Chief Complaint   Hip Pain   History of Present Illness   Ann Roberson is a 70 y.o. year-old female with a history of hypertension, diabetes presenting to the ED with chief complaint of hip pain.  Location: Left hip Duration: 3 days Onset: Gradual Timing: Constant Description: Sharp Severity: Moderate Exacerbating/Alleviating Factors: Worse with palpation of the lateral hip, worse with laying on that side, worse with ambulating Associated Symptoms: None Pertinent Negatives: No trauma, no fever, no pain anywhere else  Additional History: None   Review of Systems  A complete 10 system review of systems was obtained and all systems are negative except as noted in the HPI and PMH.   Patient's Health History    Past Medical History:  Diagnosis Date   Anemia    Arthritis    Diabetes mellitus    GERD (gastroesophageal reflux disease)    Heart murmur    Hyperlipidemia    Hypertension    Neuropathy    Sarcoidosis     Past Surgical History:  Procedure Laterality Date   BIOPSY  02/08/2018   Procedure: BIOPSY;  Surgeon: Daneil Dolin, MD;  [facility];  Service: Endoscopy;;   CESAREAN SECTION     CHOLECYSTECTOMY     COLONOSCOPY WITH PROPOFOL N/A 07/06/2017   Dr. Gala Romney: Diverticulosis.five 8 to 16 mm polyps in the ascending colon and in the cecum, removed with hot snare.  Largest polyp could not be removed completely, status post inking.  Pathology revealed cecal tubular adenoma, a sending colon polyps with multiple fragments of tubular adenomas, tubulovillous adenoma, no high-grade dysplasia or malignancy.  Tubular adenomas removed from the rectum as    COLONOSCOPY WITH PROPOFOL N/A 02/08/2018   12 mm tubular adenoma removed from the ileocecal valve, hyperplastic sigmoid colon polyp removed.  Possible residual polypoid tissue at the  ascending colon segment previously tattooed, biopsied (tubular adenoma) and ablated with APC.  Recommend 2-year surveillance study.   COLONOSCOPY WITH PROPOFOL N/A 07/06/2020   Procedure: COLONOSCOPY WITH PROPOFOL;  Surgeon: Daneil Dolin, MD;  [facility];  Service: Endoscopy;  Laterality: N/A;  am appt, diabetic. general anesthesia; intubation to control respirations. Add extra 15 min - needs to be 1st case per Melanie   MASTECTOMY, PARTIAL Left    benign   POLYPECTOMY  07/06/2017   Procedure: POLYPECTOMY;  Surgeon: Daneil Dolin, MD;  [facility];  Service: Endoscopy;;  cecal polyp cs, ascending colon polyp hs   POLYPECTOMY  02/08/2018   Procedure: POLYPECTOMY;  Surgeon: Daneil Dolin, MD;  [facility];  Service: Endoscopy;;   POLYPECTOMY  07/06/2020   Procedure: POLYPECTOMY INTESTINAL;  Surgeon: Daneil Dolin, MD;  [facility];  Service: Endoscopy;;   TUBAL LIGATION      Family History  Problem Relation Age of Onset   Asthma Brother    Cancer Mother        stomach   Cancer Father        prostate   Cancer Brother        ?   Diabetes Brother    Allergic rhinitis Neg Hx    Angioedema Neg Hx    Atopy Neg Hx    Eczema Neg Hx    Immunodeficiency Neg Hx    Urticaria Neg Hx    Colon cancer  Neg Hx     Social History   Socioeconomic History   Marital status: Single    Spouse name: Not on file   Number of children: Not on file   Years of education: Not on file   Highest education level: Not on file  Occupational History   Not on file  Tobacco Use   Smoking status: Never   Smokeless tobacco: Never  Vaping Use   Vaping Use: Never used  Substance and Sexual Activity   Alcohol use: No   Drug use: No   Sexual activity: Not Currently    Birth control/protection: Post-menopausal  Other Topics Concern   Not on file  Social History Narrative   Lives in [place], [place]. Works as a Quarry manager. Had an injury at work where fell and  tripped on a phone cord.   Social Determinants of Health   Financial Resource Strain: Not on file  Food Insecurity: Not on file  Transportation Needs: Not on file  Physical Activity: Not on file  Stress: Not on file  Social Connections: Not on file  Intimate Partner Violence: Not on file     Physical Exam   Vitals:   03/04/21 2250 03/04/21 2350  BP: (!) 157/79 (!) 161/79  Pulse: 95 91  Resp: 20 18  Temp: 97.9 F (36.6 C)   SpO2: 94% 96%    CONSTITUTIONAL: Well-appearing, NAD NEURO:  Alert and oriented x 3, no focal deficits EYES:  eyes equal and reactive ENT/NECK:  no LAD, no JVD CARDIO: Regular rate, well-perfused, normal S1 and S2 PULM:  CTAB no wheezing or rhonchi GI/GU:  normal bowel sounds, non-distended, non-tender MSK/SPINE:  No gross deformities, no edema; focal tenderness palpation to the lateral hip on the left SKIN:  no rash, atraumatic PSYCH:  Appropriate speech and behavior  *Additional and/or pertinent findings included in MDM below  Diagnostic and Interventional Summary    EKG Interpretation  Date/Time:    Ventricular Rate:    PR Interval:    QRS Duration:   QT Interval:    QTC Calculation:   R Axis:     Text Interpretation:         Labs Reviewed - No data to display  DG Hip Unilat W or Wo Pelvis 2-3 Views Left  Final Result      Medications  naproxen (NAPROSYN) tablet 500 mg (500 mg Oral Given 03/04/21 2347)     Procedures  /  Critical Care Procedures  ED Course and Medical Decision Making  I have reviewed the triage vital signs, the nursing notes, and pertinent available records from the EMR.  Listed above are laboratory and imaging tests that I personally ordered, reviewed, and interpreted and then considered in my medical decision making (see below for details).  Favoring trochanteric bursitis, preserved range of motion of the hip, nothing to suggest septic joint, given age and comorbidities will obtain screening x-ray,  anticipating discharge.       Barth Kirks. [place] Small, [place] [email or web address]  Final Clinical Impressions(s) / ED Diagnoses     ICD-10-CM   1. Trochanteric bursitis of left hip  M70.62       ED Discharge Orders          Ordered    naproxen (NAPROSYN) 500 MG tablet  2 times daily        03/05/21 0009             Discharge Instructions Discussed with  and Provided to Patient:     Discharge Instructions      You were evaluated in the Emergency Department and after careful evaluation, we did not find any emergent condition requiring admission or further testing in the hospital.  Your exam/testing today was overall reassuring.  Your x-ray was normal, symptoms seem consistent with bursitis.  Take the Naprosyn anti-inflammatory as directed and rest the hip for the next few days.  Please return to the Emergency Department if you experience any worsening of your condition.  Thank you for allowing [place] to be a part of your care.         Maudie Flakes, MD 03/05/21 Berniece Salines

## 2021-03-04 NOTE — ED Triage Notes (Signed)
Pt reports left hip pain, localized with no radiation x 4 days. Pt denies injury.

## 2021-03-05 MED ORDER — NAPROXEN 500 MG PO TABS: 500.0000 mg | ORAL_TABLET | Freq: Two times a day (BID) | ORAL | 0 refills | Status: AC

## 2021-03-05 NOTE — Discharge Instructions (Signed)
You were evaluated in the Emergency Department and after careful evaluation, we did not find any emergent condition requiring admission or further testing in the hospital.  Your exam/testing today was overall reassuring.  Your x-ray was normal, symptoms seem consistent with bursitis.  Take the Naprosyn anti-inflammatory as directed and rest the hip for the next few days.  Please return to the Emergency Department if you experience any worsening of your condition.  Thank you for allowing [place] to be a part of your care.

## 2021-03-18 DIAGNOSIS — E118 Type 2 diabetes mellitus with unspecified complications: Secondary | ICD-10-CM | POA: Diagnosis not present

## 2021-03-18 DIAGNOSIS — I119 Hypertensive heart disease without heart failure: Secondary | ICD-10-CM | POA: Diagnosis not present

## 2021-03-18 DIAGNOSIS — D500 Iron deficiency anemia secondary to blood loss (chronic): Secondary | ICD-10-CM | POA: Diagnosis not present

## 2021-03-18 DIAGNOSIS — M170 Bilateral primary osteoarthritis of knee: Secondary | ICD-10-CM | POA: Diagnosis not present

## 2021-03-18 DIAGNOSIS — I10 Essential (primary) hypertension: Secondary | ICD-10-CM | POA: Diagnosis not present

## 2021-03-18 DIAGNOSIS — E785 Hyperlipidemia, unspecified: Secondary | ICD-10-CM | POA: Diagnosis not present

## 2021-03-18 DIAGNOSIS — I25118 Atherosclerotic heart disease of native coronary artery with other forms of angina pectoris: Secondary | ICD-10-CM | POA: Diagnosis not present

## 2021-03-24 ENCOUNTER — Encounter: Payer: Self-pay | Admitting: Orthopaedic Surgery

## 2021-03-24 ENCOUNTER — Ambulatory Visit: Payer: Self-pay

## 2021-03-24 ENCOUNTER — Ambulatory Visit (INDEPENDENT_AMBULATORY_CARE_PROVIDER_SITE_OTHER): Payer: Medicare Other | Admitting: Orthopaedic Surgery

## 2021-03-24 ENCOUNTER — Other Ambulatory Visit: Payer: Self-pay

## 2021-03-24 VITALS — Ht 64.0 in | Wt 265.0 lb

## 2021-03-24 DIAGNOSIS — M7062 Trochanteric bursitis, left hip: Secondary | ICD-10-CM | POA: Diagnosis not present

## 2021-03-24 DIAGNOSIS — M79605 Pain in left leg: Secondary | ICD-10-CM

## 2021-03-24 MED ORDER — BUPIVACAINE HCL 0.25 % IJ SOLN
2.0000 mL | INTRAMUSCULAR | Status: AC | PRN
  Administered 2021-03-24: 10:00:00 2 mL via INTRA_ARTICULAR

## 2021-03-24 MED ORDER — LIDOCAINE HCL 1 % IJ SOLN
2.0000 mL | INTRAMUSCULAR | Status: AC | PRN
  Administered 2021-03-24: 10:00:00 2 mL

## 2021-03-24 MED ORDER — METHYLPREDNISOLONE ACETATE 40 MG/ML IJ SUSP: 80.0000 mg | INTRAMUSCULAR | Status: DC | PRN

## 2021-03-24 NOTE — Progress Notes (Addendum)
Office Visit Note   Patient: Ann Roberson           Date of Birth: [birth year]           MRN: [id number] Visit Date: 03/24/2021              Requested by: Caren Macadam, MD [place],  [place] [address] PCP: Caren Macadam, MD   Assessment & Plan: Visit Diagnoses:  1. Pain in left leg   2. Greater trochanteric bursitis of left hip     Plan: Ann Roberson has been experiencing lateral left hip pain for approximately 1 month.  No history of injury or trauma.  We have seen her in the office on multiple occasions for the end-stage arthritis of both of her knees.  She has had successful cortisone injections.  She also is overweight with a BMI of 45 which complicates her arthritic problems.  She was seen in the emergency room recently for with left hip pain with negative films and with a diagnosis of greater trochanteric bursitis.  She was referred back to our office for further evaluation.  I did review her films on the PACS system without evidence of hip pathology.  Clinically she is tender directly over the greater trochanter.  From a diagnostic and therapeutic standpoint will inject the trochanter with betamethasone and monitor response.  This certainly is a possibility that her pain is referred from her back and we will obtain films should she continue to have a problem.  Follow-Up Instructions: Return if symptoms worsen or fail to improve.   Orders:  No orders of the defined types were placed in this encounter.  No orders of the defined types were placed in this encounter.     Procedures: Large Joint Inj: L greater trochanter on 03/24/2021 9:51 AM Indications: pain and diagnostic evaluation Details: 25 G 1.5 in needle, lateral approach  Arthrogram: No  Medications: 2 mL lidocaine 1 %; 2 mL bupivacaine 0.25 %  12 mg betamethasone injected with Xylocaine and Marcaine into greater trochanteric region left hip Procedure, treatment alternatives, risks and benefits  explained, specific risks discussed. Consent was given by the patient. Immediately prior to procedure a time out was called to verify the correct patient, procedure, equipment, support staff and site/side marked as required. Patient was prepped and draped in the usual sterile fashion.      Clinical Data: No additional findings.   Subjective: Chief Complaint  Patient presents with   Left Hip - Pain  Patient presents today for left hip pain. She said that it has been hurting for a month. No known injury. Her pain is located laterally. No groin or lower back pain. She cannot lay on that side. She said that she went to [facility] and had x-rays taken. She was given Naproxen to take, but states that it does not help.  Denies fever or chills.  Not experiencing any numbness or tingling  HPI  Review of Systems   Objective: Vital Signs: Ht 5\' 4"  (1.626 m)    Wt 265 lb (120.2 kg)    BMI 45.49 kg/m   Physical Exam Constitutional:      Appearance: She is well-developed.  Pulmonary:     Effort: Pulmonary effort is normal.  Skin:    General: Skin is warm and dry.  Neurological:     Mental Status: She is alert and oriented to person, place, and time.  Psychiatric:  Behavior: Behavior normal.    Ortho Exam awake alert and oriented x3.  Comfortable sitting.  Uses a cane to aid with her ambulation based on the arthritis of her knees.  Straight leg raise negative.  Painless range of motion both hips.  Large hips with abundant adipose.  There is local tenderness over the left greater trochanter.  No percussible back pain.  No pain over either SI joint or buttock.  Specialty Comments:  No specialty comments available.  Imaging: No results found.   PMFS History: Patient Active Problem List   Diagnosis Date Noted   Greater trochanteric bursitis of left hip 03/24/2021   Wheezing    Acute respiratory failure with hypoxia ([place]) 07/12/2020   Multiple thyroid nodules 07/12/2020    H/O adenomatous polyp of colon 05/18/2020   Bilateral primary osteoarthritis of knee 04/08/2020   Nausea vomiting and diarrhea 10/24/2018   COVID-19 virus infection 10/23/2018   DOE (dyspnea on exertion) 10/23/2018   Tubulovillous adenoma of colon 01/09/2018   Excessive ear wax 06/25/2017   Knee osteoarthritis 06/17/2017   Anemia in other chronic diseases classified elsewhere 06/17/2017   Type 2 diabetes mellitus with other specified complication ([place]) 26/71/2458   Upper airway cough syndrome 05/02/2017   Obesity, Class III, BMI 40-49.9 (morbid obesity) ([place]) 05/02/2017   Dyspnea on exertion 05/01/2017   Hypertension associated with diabetes ([place]) 04/06/2017   Hyperlipidemia associated with type 2 diabetes mellitus ([place]) 04/06/2017   Urticaria 11/17/2015   Angioedema 11/17/2015   Pain in joint, lower leg 12/10/2013   Past Medical History:  Diagnosis Date   Anemia    Arthritis    Diabetes mellitus    GERD (gastroesophageal reflux disease)    Heart murmur    Hyperlipidemia    Hypertension    Neuropathy    Sarcoidosis     Family History  Problem Relation Age of Onset   Asthma Brother    Cancer Mother        stomach   Cancer Father        prostate   Cancer Brother        ?   Diabetes Brother    Allergic rhinitis Neg Hx    Angioedema Neg Hx    Atopy Neg Hx    Eczema Neg Hx    Immunodeficiency Neg Hx    Urticaria Neg Hx    Colon cancer Neg Hx     Past Surgical History:  Procedure Laterality Date   BIOPSY  02/08/2018   Procedure: BIOPSY;  Surgeon: Daneil Dolin, MD;  [facility];  Service: Endoscopy;;   CESAREAN SECTION     CHOLECYSTECTOMY     COLONOSCOPY WITH PROPOFOL N/A 07/06/2017   Dr. Gala Romney: Diverticulosis.five 8 to 16 mm polyps in the ascending colon and in the cecum, removed with hot snare.  Largest polyp could not be removed completely, status post inking.  Pathology revealed cecal tubular adenoma, a sending colon polyps with multiple fragments  of tubular adenomas, tubulovillous adenoma, no high-grade dysplasia or malignancy.  Tubular adenomas removed from the rectum as    COLONOSCOPY WITH PROPOFOL N/A 02/08/2018   12 mm tubular adenoma removed from the ileocecal valve, hyperplastic sigmoid colon polyp removed.  Possible residual polypoid tissue at the ascending colon segment previously tattooed, biopsied (tubular adenoma) and ablated with APC.  Recommend 2-year surveillance study.   COLONOSCOPY WITH PROPOFOL N/A 07/06/2020   Procedure: COLONOSCOPY WITH PROPOFOL;  Surgeon: Daneil Dolin, MD;  [facility];  Service: Endoscopy;  Laterality: N/A;  am appt, diabetic. general anesthesia; intubation to control respirations. Add extra 15 min - needs to be 1st case per Melanie   MASTECTOMY, PARTIAL Left    benign   POLYPECTOMY  07/06/2017   Procedure: POLYPECTOMY;  Surgeon: Daneil Dolin, MD;  [facility];  Service: Endoscopy;;  cecal polyp cs, ascending colon polyp hs   POLYPECTOMY  02/08/2018   Procedure: POLYPECTOMY;  Surgeon: Daneil Dolin, MD;  [facility];  Service: Endoscopy;;   POLYPECTOMY  07/06/2020   Procedure: POLYPECTOMY INTESTINAL;  Surgeon: Daneil Dolin, MD;  [facility];  Service: Endoscopy;;   TUBAL LIGATION     Social History   Occupational History   Not on file  Tobacco Use   Smoking status: Never   Smokeless tobacco: Never  Vaping Use   Vaping Use: Never used  Substance and Sexual Activity   Alcohol use: No   Drug use: No   Sexual activity: Not Currently    Birth control/protection: Post-menopausal

## 2021-04-07 ENCOUNTER — Ambulatory Visit: Payer: Self-pay

## 2021-04-07 ENCOUNTER — Encounter: Payer: Self-pay | Admitting: Orthopaedic Surgery

## 2021-04-07 ENCOUNTER — Ambulatory Visit (INDEPENDENT_AMBULATORY_CARE_PROVIDER_SITE_OTHER): Payer: 59 | Admitting: Orthopaedic Surgery

## 2021-04-07 ENCOUNTER — Other Ambulatory Visit: Payer: Self-pay

## 2021-04-07 VITALS — Ht 64.0 in | Wt 265.0 lb

## 2021-04-07 DIAGNOSIS — G8929 Other chronic pain: Secondary | ICD-10-CM

## 2021-04-07 DIAGNOSIS — M545 Low back pain, unspecified: Secondary | ICD-10-CM | POA: Diagnosis not present

## 2021-04-07 NOTE — Progress Notes (Signed)
Office Visit Note   Patient: Ann Roberson           Date of Birth: 11-13-1950           MRN: [id number] Visit Date: 04/07/2021              Requested by: Caren Macadam, MD [place],  [place] [id number] PCP: Caren Macadam, MD   Assessment & Plan: Visit Diagnoses: No diagnosis found.  Plan: Pleasant 71 year old woman with ongoing left buttock pain radiating down her left leg since December.  Findings at last visit seem to be consistent with some trochanteric bursitis.  She was injected.  She said this did not give her any relief even short-term.  She said the pain shooting down her leg is getting worse.  She denies any loss of bowel or bladder control.  She denies any weakness.  X-rays did demonstrate some listhesis especially at L4-5.  Would recommend an MRI to see if epidural steroid injections would be an option for her.  Reviewed the x-ray with her.  For now she should continue using a cane.  Follow-Up Instructions: No follow-ups on file.   Orders:  No orders of the defined types were placed in this encounter.  No orders of the defined types were placed in this encounter.     Procedures: No procedures performed   Clinical Data: No additional findings.   Subjective: Chief Complaint  Patient presents with   Left Hip - Pain   Lower Back - Pain  Patient presents today for lef thip pain. She said that she is still having pain. She has pain that travels down her left leg. Her pain is a level 10. She takes over the counter pain medicine.     Review of Systems  All other systems reviewed and are negative.   Objective: Vital Signs: There were no vitals taken for this visit.  Physical Exam Eyes:     Extraocular Movements: Extraocular movements intact.  Pulmonary:     Effort: Pulmonary effort is normal.  Psychiatric:        Mood and Affect: Mood normal.        Behavior: Behavior normal.    Ortho Exam Examination of the lower back she has no  acute deformity.  She has 5 out of 5 strength bilaterally.  She has a negative straight leg rise she does have some hyperextension of her knee.  Tenderness in the left posterior buttock which when palpated radiates down her leg.  Sensation is grossly intact Specialty Comments:  No specialty comments available.  Imaging: No results found.   PMFS History: Patient Active Problem List   Diagnosis Date Noted   Greater trochanteric bursitis of left hip 03/24/2021   Wheezing    Acute respiratory failure with hypoxia ([facility]) 07/12/2020   Multiple thyroid nodules 07/12/2020   H/O adenomatous polyp of colon 05/18/2020   Bilateral primary osteoarthritis of knee 04/08/2020   Nausea vomiting and diarrhea 10/24/2018   COVID-19 virus infection 10/23/2018   DOE (dyspnea on exertion) 10/23/2018   Tubulovillous adenoma of colon 01/09/2018   Excessive ear wax 06/25/2017   Knee osteoarthritis 06/17/2017   Anemia in other chronic diseases classified elsewhere 06/17/2017   Type 2 diabetes mellitus with other specified complication ([facility]) 19/14/7829   Upper airway cough syndrome 05/02/2017   Obesity, Class III, BMI 40-49.9 (morbid obesity) ([facility]) 05/02/2017   Dyspnea on exertion 05/01/2017   Hypertension associated with diabetes ([facility]) 04/06/2017  Hyperlipidemia associated with type 2 diabetes mellitus ([facility]) 04/06/2017   Urticaria 11/17/2015   Angioedema 11/17/2015   Pain in joint, lower leg 12/10/2013   Past Medical History:  Diagnosis Date   Anemia    Arthritis    Diabetes mellitus    GERD (gastroesophageal reflux disease)    Heart murmur    Hyperlipidemia    Hypertension    Neuropathy    Sarcoidosis     Family History  Problem Relation Age of Onset   Asthma Brother    Cancer Mother        stomach   Cancer Father        prostate   Cancer Brother        ?   Diabetes Brother    Allergic rhinitis Neg Hx    Angioedema Neg Hx    Atopy Neg Hx    Eczema Neg Hx    Immunodeficiency Neg Hx     Urticaria Neg Hx    Colon cancer Neg Hx     Past Surgical History:  Procedure Laterality Date   BIOPSY  02/08/2018   Procedure: BIOPSY;  Surgeon: Daneil Dolin, MD;  [facility];  Service: Endoscopy;;   CESAREAN SECTION     CHOLECYSTECTOMY     COLONOSCOPY WITH PROPOFOL N/A 07/06/2017   Dr. Gala Romney: Diverticulosis.five 8 to 16 mm polyps in the ascending colon and in the cecum, removed with hot snare.  Largest polyp could not be removed completely, status post inking.  Pathology revealed cecal tubular adenoma, a sending colon polyps with multiple fragments of tubular adenomas, tubulovillous adenoma, no high-grade dysplasia or malignancy.  Tubular adenomas removed from the rectum as    COLONOSCOPY WITH PROPOFOL N/A 02/08/2018   12 mm tubular adenoma removed from the ileocecal valve, hyperplastic sigmoid colon polyp removed.  Possible residual polypoid tissue at the ascending colon segment previously tattooed, biopsied (tubular adenoma) and ablated with APC.  Recommend 2-year surveillance study.   COLONOSCOPY WITH PROPOFOL N/A 07/06/2020   Procedure: COLONOSCOPY WITH PROPOFOL;  Surgeon: Daneil Dolin, MD;  [facility];  Service: Endoscopy;  Laterality: N/A;  am appt, diabetic. general anesthesia; intubation to control respirations. Add extra 15 min - needs to be 1st case per Melanie   MASTECTOMY, PARTIAL Left    benign   POLYPECTOMY  07/06/2017   Procedure: POLYPECTOMY;  Surgeon: Daneil Dolin, MD;  [facility];  Service: Endoscopy;;  cecal polyp cs, ascending colon polyp hs   POLYPECTOMY  02/08/2018   Procedure: POLYPECTOMY;  Surgeon: Daneil Dolin, MD;  [facility];  Service: Endoscopy;;   POLYPECTOMY  07/06/2020   Procedure: POLYPECTOMY INTESTINAL;  Surgeon: Daneil Dolin, MD;  [facility];  Service: Endoscopy;;   TUBAL LIGATION     Social History   Occupational History   Not on file  Tobacco Use   Smoking status: Never    Smokeless tobacco: Never  Vaping Use   Vaping Use: Never used  Substance and Sexual Activity   Alcohol use: No   Drug use: No   Sexual activity: Not Currently    Birth control/protection: Post-menopausal

## 2021-05-06 ENCOUNTER — Other Ambulatory Visit: Payer: Self-pay

## 2021-05-06 ENCOUNTER — Ambulatory Visit
Admission: RE | Admit: 2021-05-06 | Discharge: 2021-05-06 | Disposition: A | Discharge status: Home / Self Care | Payer: 59 | Source: Ambulatory Visit | Attending: Orthopaedic Surgery | Admitting: Orthopaedic Surgery

## 2021-05-06 DIAGNOSIS — M545 Low back pain, unspecified: Secondary | ICD-10-CM

## 2021-05-06 DIAGNOSIS — G8929 Other chronic pain: Secondary | ICD-10-CM

## 2021-06-14 ENCOUNTER — Other Ambulatory Visit (HOSPITAL_COMMUNITY): Payer: Self-pay | Admitting: Family Medicine

## 2021-06-14 ENCOUNTER — Other Ambulatory Visit (HOSPITAL_COMMUNITY): Payer: Self-pay | Admitting: Nurse Practitioner

## 2021-06-14 DIAGNOSIS — E2839 Other primary ovarian failure: Secondary | ICD-10-CM

## 2021-06-14 DIAGNOSIS — Z1231 Encounter for screening mammogram for malignant neoplasm of breast: Secondary | ICD-10-CM

## 2021-06-23 ENCOUNTER — Ambulatory Visit (HOSPITAL_COMMUNITY)
Admission: RE | Admit: 2021-06-23 | Discharge: 2021-06-23 | Disposition: A | Discharge status: Home / Self Care | Payer: 59 | Source: Ambulatory Visit | Attending: Family Medicine | Admitting: Family Medicine

## 2021-06-23 ENCOUNTER — Other Ambulatory Visit: Payer: Self-pay

## 2021-06-23 DIAGNOSIS — Z1231 Encounter for screening mammogram for malignant neoplasm of breast: Secondary | ICD-10-CM | POA: Insufficient documentation

## 2021-06-23 DIAGNOSIS — Z13820 Encounter for screening for osteoporosis: Secondary | ICD-10-CM | POA: Insufficient documentation

## 2021-06-23 DIAGNOSIS — Z794 Long term (current) use of insulin: Secondary | ICD-10-CM | POA: Diagnosis not present

## 2021-06-23 DIAGNOSIS — E2839 Other primary ovarian failure: Secondary | ICD-10-CM | POA: Insufficient documentation

## 2021-06-23 DIAGNOSIS — E119 Type 2 diabetes mellitus without complications: Secondary | ICD-10-CM | POA: Diagnosis not present

## 2021-06-23 DIAGNOSIS — Z780 Asymptomatic menopausal state: Secondary | ICD-10-CM | POA: Diagnosis not present

## 2021-10-10 NOTE — Progress Notes (Unsigned)
Cardiology Office Note:    Date:  10/13/2021   ID:  Ann Roberson, Ann Roberson May 20, 1950, MRN [id number]  PCP:  Caren Macadam, [place] Providers Cardiologist:  Kate Sable, MD (Inactive) {  Referring MD: Caren Macadam, MD    History of Present Illness:    Ann Roberson is a 71 y.o. female with a hx of presumed CAD abnormal nuclear study has never been cath because she was symptomatically stable, hypertension, mild-to-moderate AS, HLD, IDDM who was previously followed by Dr. Bronson Ing who now presents to clinic for follow-up.  Last TTE 05/2020 with LVEF 60-65% mild -mod AS means gradient 18.5 mmHg up from 13 mmHg 2013, possible left pleural effusion.   Was last seen in clinic by Ermalinda Barrios, PA-C where she was stable from a CV standpoint.  Today, the patient overall feels well. Occasional chest pain that feels like tugging in her chest that can oocur with rest or with exertion. Resolves on it's own after a couple of minutes. Has occurred 4-5 times per year. Also reports dyspnea on exertion with activity that is stable. No lightheadedness, dizziness, palpitations, orthopnea or PND. Right foot is chronically swollen. Takes HCTZ which helps.   Blood pressure usually 130s at home. Last A1C 7.0.  Tolerating medications without issues. Compliant with meds.  Past Medical History:  Diagnosis Date   Anemia    Arthritis    Diabetes mellitus    GERD (gastroesophageal reflux disease)    Heart murmur    Hyperlipidemia    Hypertension    Neuropathy    Sarcoidosis     Past Surgical History:  Procedure Laterality Date   BIOPSY  02/08/2018   Procedure: BIOPSY;  Surgeon: Daneil Dolin, MD;  [facility];  Service: Endoscopy;;   CESAREAN SECTION     CHOLECYSTECTOMY     COLONOSCOPY WITH PROPOFOL N/A 07/06/2017   Dr. Gala Romney: Diverticulosis.five 8 to 16 mm polyps in the ascending colon and in the cecum, removed with hot snare.  Largest polyp could  not be removed completely, status post inking.  Pathology revealed cecal tubular adenoma, a sending colon polyps with multiple fragments of tubular adenomas, tubulovillous adenoma, no high-grade dysplasia or malignancy.  Tubular adenomas removed from the rectum as    COLONOSCOPY WITH PROPOFOL N/A 02/08/2018   12 mm tubular adenoma removed from the ileocecal valve, hyperplastic sigmoid colon polyp removed.  Possible residual polypoid tissue at the ascending colon segment previously tattooed, biopsied (tubular adenoma) and ablated with APC.  Recommend 2-year surveillance study.   COLONOSCOPY WITH PROPOFOL N/A 07/06/2020   Procedure: COLONOSCOPY WITH PROPOFOL;  Surgeon: Daneil Dolin, MD;  [facility];  Service: Endoscopy;  Laterality: N/A;  am appt, diabetic. general anesthesia; intubation to control respirations. Add extra 15 min - needs to be 1st case per Melanie   MASTECTOMY, PARTIAL Left    benign   POLYPECTOMY  07/06/2017   Procedure: POLYPECTOMY;  Surgeon: Daneil Dolin, MD;  [facility];  Service: Endoscopy;;  cecal polyp cs, ascending colon polyp hs   POLYPECTOMY  02/08/2018   Procedure: POLYPECTOMY;  Surgeon: Daneil Dolin, MD;  [facility];  Service: Endoscopy;;   POLYPECTOMY  07/06/2020   Procedure: POLYPECTOMY INTESTINAL;  Surgeon: Daneil Dolin, MD;  [facility];  Service: Endoscopy;;   TUBAL LIGATION      Current Medications: Current Meds  Medication Sig   acetaminophen (TYLENOL) 650 MG  CR tablet Take 1,300 mg by mouth 2 (two) times daily.   albuterol (VENTOLIN HFA) 108 (90 Base) MCG/ACT inhaler Inhale 2 puffs into the lungs every 6 (six) hours as needed for wheezing or shortness of breath.   amLODipine (NORVASC) 10 MG tablet Take 10 mg by mouth in the morning.   aspirin EC 81 MG tablet Take 81 mg by mouth in the morning. Swallow whole.   atorvastatin (LIPITOR) 20 MG tablet Take 1 tablet by mouth daily.   B-D UF III MINI PEN  NEEDLES 31G X 5 MM MISC USE WITH LANTUS PEN   diclofenac Sodium (VOLTAREN) 1 % GEL Apply 1 application topically 2 (two) times daily as needed.   DULoxetine (CYMBALTA) 60 MG capsule Take 60 mg by mouth at bedtime.   famotidine (PEPCID) 20 MG tablet One after supper   FARXIGA 10 MG TABS tablet Take 10 mg by mouth daily.   gabapentin (NEURONTIN) 300 MG capsule TAKE 2 CAPSULES BY MOUTH THREE TIMES DAILY   guaiFENesin-dextromethorphan (ROBITUSSIN DM) 100-10 MG/5ML syrup Take 10 mLs by mouth every 8 (eight) hours.   hydrochlorothiazide (HYDRODIURIL) 25 MG tablet Take 25 mg by mouth every other day. In the morning   LANTUS SOLOSTAR 100 UNIT/ML Solostar Pen Inject 50 Units into the skin daily at 10 pm.   losartan (COZAAR) 100 MG tablet TAKE 1 TABLET BY MOUTH ONCE DAILY   metFORMIN (GLUCOPHAGE) 1000 MG tablet TAKE 1 TABLET BY MOUTH TWICE DAILY   metoprolol tartrate (LOPRESSOR) 25 MG tablet TAKE ONE TABLET BY MOUTH TWICE DAILY   naproxen (NAPROSYN) 500 MG tablet Take 1 tablet (500 mg total) by mouth 2 (two) times daily.   nitroGLYCERIN (NITROSTAT) 0.4 MG SL tablet Place 1 tablet (0.4 mg total) under the tongue every 5 (five) minutes as needed.   OZEMPIC, 0.25 OR 0.5 MG/DOSE, 2 MG/1.5ML SOPN Inject 0.5 mg into the skin every Sunday.   potassium chloride SA (KLOR-CON) 20 MEQ tablet Take 1 tablet (20 mEq total) by mouth every other day. In the morning     Allergies:   Erythromycin   Social History   Socioeconomic History   Marital status: Single    Spouse name: Not on file   Number of children: Not on file   Years of education: Not on file   Highest education level: Not on file  Occupational History   Not on file  Tobacco Use   Smoking status: Never   Smokeless tobacco: Never  Vaping Use   Vaping Use: Never used  Substance and Sexual Activity   Alcohol use: No   Drug use: No   Sexual activity: Not Currently    Birth control/protection: Post-menopausal  Other Topics Concern   Not on file   Social History Narrative   Lives in [place], [place]. 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   Social Determinants of Health   Financial Resource Strain: Low Risk  (10/24/2018)   Overall Financial Resource Strain (CARDIA)    Difficulty of Paying Living Expenses: Not hard at all  Food Insecurity: No Food Insecurity (10/24/2018)   Hunger Vital Sign    Worried About Running Out of Food in the Last Year: Never true    Ran Out of Food in the Last Year: Never true  Transportation Needs: No Transportation Needs (10/24/2018)   PRAPARE - Hydrologist (Medical): No    Lack of Transportation (Non-Medical): No  Physical Activity: Inactive (10/24/2018)   Exercise Vital Sign    Days of Exercise per Week: 0 days    Minutes of Exercise per Session: 0 min  Stress: No Stress Concern Present (10/24/2018)   [place]    Feeling of Stress : Only a little  Social Connections: Moderately Integrated (10/24/2018)   Social Connection and Isolation Panel [NHANES]    Frequency of Communication with Friends and Family: More than three times a week    Frequency of Social Gatherings with Friends and Family: More than three times a week    Attends Religious Services: More than 4 times per year    Active Member of [employer] or Organizations: Yes    Attends Archivist Meetings: 1 to 4 times per year    Marital Status: Divorced     Family History: The patient's family history includes Asthma in her brother; Cancer in her brother, father, and mother; Diabetes in her brother. There is no history of Allergic rhinitis, Angioedema, Atopy, Eczema, Immunodeficiency, Urticaria, or Colon cancer.  ROS:   Please see the history of present illness.     All other systems reviewed and are negative.  EKGs/Labs/Other Studies Reviewed:    The following studies were reviewed today: TTE  05/2020: IMPRESSIONS     1. Left ventricular ejection fraction, by estimation, is 60 to 65%. The  left ventricle has normal function. The left ventricle has no regional  wall motion abnormalities.   2. Right ventricular systolic function is normal. The right ventricular  size is normal. Tricuspid regurgitation signal is inadequate for assessing  PA pressure.   3. Fluid collection posterior to left venticle is most consistent with  pleural effusion.   4. The mitral valve is grossly normal. Trivial mitral valve  regurgitation.   5. The aortic valve has an indeterminant number of cusps. There is  moderate calcification of the aortic valve. Aortic valve regurgitation is  not visualized. Mild to moderate aortic valve stenosis. Aortic valve mean  gradient measures 18.5 mmHg. Aortic  valve Vmax measures 2.94 m/s. Dimentionless index 0.41.   6. The inferior vena cava is normal in size with greater than 50%  respiratory variability, suggesting right atrial pressure of 3 mmHg.   Myoview 04/2017: No diagnostic ST segment changes to indicate ischemia. Small, mild intensity, mid to apical anteroseptal defect that is fixed, more prominent on rest imaging and suggestive of soft tissue attenuation. Medium, mild intensity, inferoseptal and inferolateral defect that partially reversible in the mid to apical distribution and fixed at the base suggestive of scar with mild to moderate peri-infarct ischemia. This is an intermediate risk study. Nuclear stress EF: 58%.  EKG:  EKG is  ordered today.  The ekg ordered today demonstrates NSR, possible anterior infarct, HR 76  Recent Labs: 12/21/2020: ALT 17; BUN 25; Creatinine, Ser 1.13; Hemoglobin 11.6; Platelets 271; Potassium 4.5; Sodium 137  Recent Lipid Panel    Component Value Date/Time   CHOL 97 12/21/2020 1210   TRIG 125 12/21/2020 1210   HDL 37 (L) 12/21/2020 1210   CHOLHDL 2.6 12/21/2020 1210   VLDL 25 12/21/2020 1210   LDLCALC 35 12/21/2020  1210   LDLCALC 69 12/08/2017 0711     Risk Assessment/Calculations:           Physical Exam:    VS:  BP 136/74   Pulse 76   Ht '5\' 4"'$  (1.626 m)   Wt 246 lb 12.8  oz (111.9 kg)   SpO2 96%   BMI 42.36 kg/m     Wt Readings from Last 3 Encounters:  10/13/21 246 lb 12.8 oz (111.9 kg)  04/07/21 265 lb (120.2 kg)  03/24/21 265 lb (120.2 kg)     GEN:  Well nourished, well developed in no acute distress HEENT: Normal NECK: No JVD; No carotid bruits CARDIAC: RRR, 2/6 harsh systolic murmur RESPIRATORY:  Clear to auscultation without rales, wheezing or rhonchi  ABDOMEN: Soft, non-tender, non-distended MUSCULOSKELETAL:  Right ankle edema, trace left foot edema SKIN: Warm and dry NEUROLOGIC:  Alert and oriented x 3 PSYCHIATRIC:  Normal affect   ASSESSMENT:    1. Moderate aortic stenosis   2. Coronary artery disease involving native coronary artery of native heart without angina pectoris   3. Essential hypertension   4. Type 2 diabetes mellitus with other specified complication, without long-term current use of insulin ([facility])   5. Abnormal nuclear stress test   6. Nonrheumatic aortic valve stenosis   7. Hyperlipidemia LDL goal <70   8. Obesity, Class III, BMI 40-49.9 (morbid obesity) ([facility])    PLAN:    In order of problems listed above:  #Presumed CAD with abnormal nuclear stress in 2019: Nuc in 2019 with medium sized inferoseptal and inferolateral defect that was reversible. Managed medically due to no significant symptoms. Continues to deny any anginal symptoms and would prefer to continue with medical management. -Continue ASA '81mg'$  daily -Continue lipitor '20mg'$  daily  #Mild-to-Moderate AS: Mean gradient 18.8mHg on TTE 05/2020.  -Repeat TTE for monitoring  #HTN: SBP 130s at home. Will continue to monitor. Can uptitrate metop if needed. -Continue metop '25mg'$  BID -Continue amlodipine '10mg'$  daily -Continue HCTZ '25mg'$  daily -Continue losartan '100mg'$  daily  #DMII: -Continue  ozempic, farxiga and metformin  #HLD: -Continue lipitor '20mg'$  daily -LDL 34 06/11/21  #Morbid Obesity: BMI 42.  -Continue ozempic -Lifestyle modifications as below  Exercise recommendations: Goal of exercising for at least 30 minutes a day, at least 5 times per week.  Please exercise to a moderate exertion.  This means that while exercising it is difficult to speak in full sentences, however you are not so short of breath that you feel you must stop, and not so comfortable that you can carry on a full conversation.  Exertion level should be approximately a 5/10, if 10 is the most exertion you can perform.  Diet recommendations: Recommend a heart healthy diet such as the Mediterranean diet.  This diet consists of plant based foods, healthy fats, lean meats, olive oil.  It suggests limiting the intake of simple carbohydrates such as white breads, pastries, and pastas.  It also limits the amount of red meat, wine, and dairy products such as cheese that one should consume on a daily basis.       Medication Adjustments/Labs and Tests Ordered: Current medicines are reviewed at length with the patient today.  Concerns regarding medicines are outlined above.  Orders Placed This Encounter  Procedures   EKG 12-Lead   ECHOCARDIOGRAM COMPLETE   No orders of the defined types were placed in this encounter.   Patient Instructions  Medication Instructions:   Your physician recommends that you continue on your current medications as directed. Please refer to the Current Medication list given to you today.  *If you need a refill on your cardiac medications before your next appointment, please call your pharmacy*   Testing/Procedures:  Your physician has requested that you have an echocardiogram. Echocardiography is a  painless test that uses sound waves to create images of your heart. It provides your doctor with information about the size and shape of your heart and how well your heart's chambers  and valves are working. This procedure takes approximately one hour. There are no restrictions for this procedure.   Follow-Up: At [facility], you and your health needs are our priority.  As part of our continuing mission to provide you with exceptional heart care, we have created designated Provider Care Teams.  These Care Teams include your primary Cardiologist (physician) and Advanced Practice Providers (APPs -  Physician Assistants and Nurse Practitioners) who all work together to provide you with the care you need, when you need it.  We recommend signing up for the patient portal called "MyChart".  Sign up information is provided on this After Visit Summary.  MyChart is used to connect with patients for Virtual Visits (Telemedicine).  Patients are able to view lab/test results, encounter notes, upcoming appointments, etc.  Non-urgent messages can be sent to your provider as well.   To learn more about what you can do with MyChart, go to [email or web address].    Your next appointment:   6 month(s)  The format for your next appointment:   In Person  Provider:   Dr. Johney Frame   Important Information About Sugar         Signed, Freada Bergeron, MD  10/13/2021 10:30 AM    [place]

## 2021-10-13 ENCOUNTER — Encounter: Payer: Self-pay | Admitting: Cardiology

## 2021-10-13 ENCOUNTER — Ambulatory Visit (INDEPENDENT_AMBULATORY_CARE_PROVIDER_SITE_OTHER): Payer: 59 | Admitting: Cardiology

## 2021-10-13 VITALS — BP 136/74 | HR 76 | Ht 64.0 in | Wt 246.8 lb

## 2021-10-13 DIAGNOSIS — I350 Nonrheumatic aortic (valve) stenosis: Secondary | ICD-10-CM

## 2021-10-13 DIAGNOSIS — E1169 Type 2 diabetes mellitus with other specified complication: Secondary | ICD-10-CM

## 2021-10-13 DIAGNOSIS — E785 Hyperlipidemia, unspecified: Secondary | ICD-10-CM

## 2021-10-13 DIAGNOSIS — I10 Essential (primary) hypertension: Secondary | ICD-10-CM

## 2021-10-13 DIAGNOSIS — R9439 Abnormal result of other cardiovascular function study: Secondary | ICD-10-CM

## 2021-10-13 DIAGNOSIS — I2510 Atherosclerotic heart disease of native coronary artery without angina pectoris: Secondary | ICD-10-CM | POA: Diagnosis not present

## 2021-10-13 DIAGNOSIS — E66813 Obesity, class 3: Secondary | ICD-10-CM

## 2021-10-13 NOTE — Patient Instructions (Signed)
Medication Instructions:   Your physician recommends that you continue on your current medications as directed. Please refer to the Current Medication list given to you today.  *If you need a refill on your cardiac medications before your next appointment, please call your pharmacy*   Testing/Procedures:  Your physician has requested that you have an echocardiogram. Echocardiography is a painless test that uses sound waves to create images of your heart. It provides your doctor with information about the size and shape of your heart and how well your heart's chambers and valves are working. This procedure takes approximately one hour. There are no restrictions for this procedure.   Follow-Up: At [facility], you and your health needs are our priority.  As part of our continuing mission to provide you with exceptional heart care, we have created designated Provider Care Teams.  These Care Teams include your primary Cardiologist (physician) and Advanced Practice Providers (APPs -  Physician Assistants and Nurse Practitioners) who all work together to provide you with the care you need, when you need it.  We recommend signing up for the patient portal called "MyChart".  Sign up information is provided on this After Visit Summary.  MyChart is used to connect with patients for Virtual Visits (Telemedicine).  Patients are able to view lab/test results, encounter notes, upcoming appointments, etc.  Non-urgent messages can be sent to your provider as well.   To learn more about what you can do with MyChart, go to [email or web address].    Your next appointment:   6 month(s)  The format for your next appointment:   In Person  Provider:   Dr. Johney Frame   Important Information About Sugar

## 2021-10-25 ENCOUNTER — Ambulatory Visit (HOSPITAL_COMMUNITY)
Admission: RE | Admit: 2021-10-25 | Discharge: 2021-10-25 | Disposition: A | Discharge status: Home / Self Care | Payer: 59 | Source: Ambulatory Visit | Attending: Cardiology | Admitting: Cardiology

## 2021-10-25 DIAGNOSIS — I350 Nonrheumatic aortic (valve) stenosis: Secondary | ICD-10-CM | POA: Diagnosis present

## 2021-10-25 LAB — ECHOCARDIOGRAM COMPLETE
AR max vel: 0.94 cm2
AV Area VTI: 1.01 cm2
AV Area mean vel: 0.88 cm2
AV Mean grad: 16 mmHg
AV Peak grad: 28.9 mmHg
Ao pk vel: 2.69 m/s
Area-P 1/2: 2.97 cm2
S' Lateral: 2.8 cm

## 2021-10-25 NOTE — Progress Notes (Signed)
*  PRELIMINARY RESULTS* Echocardiogram 2D Echocardiogram has been performed.  Ann Roberson 10/25/2021, 10:28 AM

## 2021-10-26 ENCOUNTER — Telehealth: Payer: Self-pay | Admitting: *Deleted

## 2021-10-26 DIAGNOSIS — I350 Nonrheumatic aortic (valve) stenosis: Secondary | ICD-10-CM

## 2021-10-26 NOTE — Telephone Encounter (Signed)
-----   Message from Freada Bergeron, MD sent at 10/25/2021  9:29 PM EDT ----- Her echo shows normal pumping function. She has mild narrowing of her aortic valve which is stable from prior. We will continue to monitor with serial echoes going forward with next in 1 year.

## 2021-10-26 NOTE — Telephone Encounter (Signed)
The patient has been notified of the result and verbalized understanding.  All questions (if any) were answered.  Pt aware that I will go ahead and place the order for repeat echo in one year in the system and send a message to our [facility] Schedulers to call her back closer to that time, and arrange this appt.  Pt verbalized understanding and agrees with this plan.  Will place one year echo recall in the system as well.

## 2021-11-03 DIAGNOSIS — E118 Type 2 diabetes mellitus with unspecified complications: Secondary | ICD-10-CM | POA: Diagnosis not present

## 2021-11-03 DIAGNOSIS — I10 Essential (primary) hypertension: Secondary | ICD-10-CM | POA: Diagnosis not present

## 2021-11-03 DIAGNOSIS — I25118 Atherosclerotic heart disease of native coronary artery with other forms of angina pectoris: Secondary | ICD-10-CM | POA: Diagnosis not present

## 2021-11-03 DIAGNOSIS — R202 Paresthesia of skin: Secondary | ICD-10-CM | POA: Diagnosis not present

## 2021-11-03 DIAGNOSIS — G5603 Carpal tunnel syndrome, bilateral upper limbs: Secondary | ICD-10-CM | POA: Diagnosis not present

## 2021-11-05 DIAGNOSIS — G5603 Carpal tunnel syndrome, bilateral upper limbs: Secondary | ICD-10-CM | POA: Diagnosis not present

## 2021-11-08 ENCOUNTER — Telehealth: Payer: Self-pay | Admitting: *Deleted

## 2021-11-08 NOTE — Telephone Encounter (Signed)
-----   Message from Delynn Flavin sent at 11/05/2021  1:35 PM EDT ----- Regarding: RE: one year echo per Dr. Johney Frame Patient is scheduled 10/27/22 @ 9:30am at [facility]. Thanks! ----- Message ----- From: Damian Leavell, RN Sent: 11/05/2021   1:33 PM EDT To: Delynn Flavin Subject: RE: one year echo per Dr. Johney Frame            If you can just return this with the echo date when you are done.  TY!!! ----- Message ----- From: Delynn Flavin Sent: 11/03/2021   8:12 AM EDT To: Nuala Alpha, LPN Subject: RE: one year echo per Dr. Johney Frame            The order is not yet put in.  ----- Message ----- From: Nuala Alpha, LPN Sent: 06/01/2900   9:13 AM EDT To: Frederic Jericho; [facility] Subject: one year echo per Dr. Johney Frame                Dr. Johney Frame ordered for this pt to have a one year echo to follow moderate AS. Order is in and pt aware you will call to schedule.  Can you please arrange and shoot me the date thereafter?  Thanks for all you do, Karlene Einstein

## 2021-11-10 DIAGNOSIS — E785 Hyperlipidemia, unspecified: Secondary | ICD-10-CM | POA: Diagnosis not present

## 2021-11-10 DIAGNOSIS — I25118 Atherosclerotic heart disease of native coronary artery with other forms of angina pectoris: Secondary | ICD-10-CM | POA: Diagnosis not present

## 2021-11-10 DIAGNOSIS — I10 Essential (primary) hypertension: Secondary | ICD-10-CM | POA: Diagnosis not present

## 2021-11-10 DIAGNOSIS — E118 Type 2 diabetes mellitus with unspecified complications: Secondary | ICD-10-CM | POA: Diagnosis not present

## 2021-11-17 DIAGNOSIS — G5603 Carpal tunnel syndrome, bilateral upper limbs: Secondary | ICD-10-CM | POA: Diagnosis not present

## 2021-11-22 DIAGNOSIS — N1832 Chronic kidney disease, stage 3b: Secondary | ICD-10-CM | POA: Diagnosis not present

## 2021-11-22 DIAGNOSIS — M170 Bilateral primary osteoarthritis of knee: Secondary | ICD-10-CM | POA: Diagnosis not present

## 2021-11-22 DIAGNOSIS — E1122 Type 2 diabetes mellitus with diabetic chronic kidney disease: Secondary | ICD-10-CM | POA: Diagnosis not present

## 2021-11-22 DIAGNOSIS — R809 Proteinuria, unspecified: Secondary | ICD-10-CM | POA: Diagnosis not present

## 2021-11-22 DIAGNOSIS — I129 Hypertensive chronic kidney disease with stage 1 through stage 4 chronic kidney disease, or unspecified chronic kidney disease: Secondary | ICD-10-CM | POA: Diagnosis not present

## 2021-12-02 DIAGNOSIS — G5603 Carpal tunnel syndrome, bilateral upper limbs: Secondary | ICD-10-CM | POA: Diagnosis not present

## 2021-12-09 DIAGNOSIS — G5602 Carpal tunnel syndrome, left upper limb: Secondary | ICD-10-CM | POA: Diagnosis not present

## 2021-12-10 ENCOUNTER — Telehealth: Payer: Self-pay | Admitting: *Deleted

## 2021-12-10 NOTE — Telephone Encounter (Signed)
   Primary Cardiologist: Freada Bergeron, MD  Chart reviewed as part of pre-operative protocol coverage. Given past medical history and time since last visit, based on ACC/AHA guidelines, Ann Roberson would be at acceptable risk for the planned procedure without further cardiovascular testing.   Patient was advised that if she develops new symptoms prior to surgery to contact our office to arrange a follow-up appointment. She verbalized understanding.  I will route this recommendation to the requesting party via Epic fax function and remove from pre-op pool.  Please call with questions.  Emmaline Life, NP-C  12/10/2021, 4:07 PM [address], [address] Office [phone] Fax [phone]

## 2021-12-10 NOTE — Telephone Encounter (Signed)
   Pre-operative Risk Assessment    Patient Name: Ann Roberson  DOB: 1951/03/20 MRN: [id number]      Request for Surgical Clearance    Procedure:   LT CARPAL TUNNEL RELEASE  Date of Surgery:  Clearance 12/24/21                                 Surgeon:  Orene Desanctis, MD Surgeon's Group or Practice Name:  [facility] Phone number:  [phone] Fax number:  [phone]  Ann Roberson   Type of Clearance Requested:   - Pharmacy:  Hold Aspirin NOT INDICATED   Type of Anesthesia:  MAC   Additional requests/questions:    Ann Roberson   12/10/2021, 9:58 AM

## 2021-12-10 NOTE — Telephone Encounter (Signed)
Dr. Johney Frame, you saw this patient on 10/13/21 with a stable repeat echocardiogram and plan to return in 6 months for follow-up. Do you agree that she may proceed with carpal tunnel release under MAC anesthesia?  Please route your response to p cv div preop.  Thank you, Emmaline Life, NP-C   12/10/2021, 1:07 PM [address] Office [phone] Fax [phone]

## 2021-12-15 ENCOUNTER — Ambulatory Visit
Admission: EM | Admit: 2021-12-15 | Discharge: 2021-12-15 | Disposition: A | Discharge status: Home / Self Care | Payer: Medicare Other | Attending: Nurse Practitioner | Admitting: Nurse Practitioner

## 2021-12-15 DIAGNOSIS — Z23 Encounter for immunization: Secondary | ICD-10-CM

## 2021-12-15 DIAGNOSIS — S61210A Laceration without foreign body of right index finger without damage to nail, initial encounter: Secondary | ICD-10-CM | POA: Diagnosis not present

## 2021-12-15 MED ORDER — TETANUS-DIPHTH-ACELL PERTUSSIS 5-2.5-18.5 LF-MCG/0.5 IM SUSY
0.5000 mL | PREFILLED_SYRINGE | Freq: Once | INTRAMUSCULAR | Status: AC
  Administered 2021-12-15: 0.5 mL via INTRAMUSCULAR

## 2021-12-15 NOTE — ED Triage Notes (Signed)
Pt presents with laceration in right index finger with a knife when cooking  around 1800 today. Pt denies pain, numbness, tingling.

## 2021-12-15 NOTE — Discharge Instructions (Signed)
Laceration to the right index finger was repaired with Dermabond.   Your tetanus shot was updated today, which is good for the next 10 years. Do not pick or disrupt the adhesive. Keep the dressing in place for 24 hours.Remove the dressing and clean the area with warm water in 24 hours.  When you are at home, may leave the area open to air.  When you are out, recommend keeping the area covered. May apply Neosporin to area as needed. May take over-the-counter ibuprofen or Tylenol for pain or discomfort. Follow-up immediately if you develop swelling, increased redness that goes into the hand or up the arm, drainage, or if you develop fever, chills, or other concerns. Follow-up in this clinic if you develop any concerns.

## 2021-12-15 NOTE — ED Provider Notes (Signed)
[facility]    CSN: [id number] Arrival date & time: 12/15/21  1820      History   Chief Complaint Chief Complaint  Patient presents with   Laceration    HPI Ann Roberson is a 71 y.o. female.   The history is provided by the patient.   Patient presents with a laceration to the right index finger.  Patient states that she stuck her hand into dishwater that had a knife that she had just put in there, states when she stuck her hand in there she felt the knife cut the tip of the right index finger.  She states that she had moderate bleeding after the incident occurred.  She denies numbness, tingling, radiation of pain, decreased range of motion, or inability to move the right index finger.  Patient states that she does take aspirin and she does have a history of diabetes.  She is unsure of when she had her last tetanus shot.  Past Medical History:  Diagnosis Date   Anemia    Arthritis    Diabetes mellitus    GERD (gastroesophageal reflux disease)    Heart murmur    Hyperlipidemia    Hypertension    Neuropathy    Sarcoidosis     Patient Active Problem List   Diagnosis Date Noted   Greater trochanteric bursitis of left hip 03/24/2021   Wheezing    Acute respiratory failure with hypoxia ([place]) 07/12/2020   Multiple thyroid nodules 07/12/2020   H/O adenomatous polyp of colon 05/18/2020   Bilateral primary osteoarthritis of knee 04/08/2020   Nausea vomiting and diarrhea 10/24/2018   COVID-19 virus infection 10/23/2018   DOE (dyspnea on exertion) 10/23/2018   Tubulovillous adenoma of colon 01/09/2018   Excessive ear wax 06/25/2017   Knee osteoarthritis 06/17/2017   Anemia in other chronic diseases classified elsewhere 06/17/2017   Type 2 diabetes mellitus with other specified complication ([place]) 23/55/7322   Upper airway cough syndrome 05/02/2017   Obesity, Class III, BMI 40-49.9 (morbid obesity) ([place]) 05/02/2017   Dyspnea on exertion 05/01/2017    Hypertension associated with diabetes ([place]) 04/06/2017   Hyperlipidemia associated with type 2 diabetes mellitus ([place]) 04/06/2017   Urticaria 11/17/2015   Angioedema 11/17/2015   Pain in joint, lower leg 12/10/2013    Past Surgical History:  Procedure Laterality Date   BIOPSY  02/08/2018   Procedure: BIOPSY;  Surgeon: Daneil Dolin, MD;  Location: [facility];  Service: Endoscopy;;   CESAREAN SECTION     CHOLECYSTECTOMY     COLONOSCOPY WITH PROPOFOL N/A 07/06/2017   Dr. Gala Romney: Diverticulosis.five 8 to 16 mm polyps in the ascending colon and in the cecum, removed with hot snare.  Largest polyp could not be removed completely, status post inking.  Pathology revealed cecal tubular adenoma, a sending colon polyps with multiple fragments of tubular adenomas, tubulovillous adenoma, no high-grade dysplasia or malignancy.  Tubular adenomas removed from the rectum as    COLONOSCOPY WITH PROPOFOL N/A 02/08/2018   12 mm tubular adenoma removed from the ileocecal valve, hyperplastic sigmoid colon polyp removed.  Possible residual polypoid tissue at the ascending colon segment previously tattooed, biopsied (tubular adenoma) and ablated with APC.  Recommend 2-year surveillance study.   COLONOSCOPY WITH PROPOFOL N/A 07/06/2020   Procedure: COLONOSCOPY WITH PROPOFOL;  Surgeon: Daneil Dolin, MD;  Location: [facility];  Service: Endoscopy;  Laterality: N/A;  am appt, diabetic. general anesthesia; intubation to control respirations. Add extra 15 min -  needs to be 1st case per Melanie   MASTECTOMY, PARTIAL Left    benign   POLYPECTOMY  07/06/2017   Procedure: POLYPECTOMY;  Surgeon: Daneil Dolin, MD;  Location: [facility];  Service: Endoscopy;;  cecal polyp cs, ascending colon polyp hs   POLYPECTOMY  02/08/2018   Procedure: POLYPECTOMY;  Surgeon: Daneil Dolin, MD;  Location: [facility];  Service: Endoscopy;;   POLYPECTOMY  07/06/2020   Procedure: POLYPECTOMY INTESTINAL;  Surgeon: Daneil Dolin, MD;  Location: [facility];  Service: Endoscopy;;   TUBAL LIGATION      OB History     Gravida  1   Para  1   Term  1   Preterm      AB      Living  1      SAB      IAB      Ectopic      Multiple      Live Births               Home Medications    Prior to Admission medications   Medication Sig Start Date End Date Taking? Authorizing Provider  acetaminophen (TYLENOL) 650 MG CR tablet Take 1,300 mg by mouth 2 (two) times daily.    [provider]  albuterol (VENTOLIN HFA) 108 (90 Base) MCG/ACT inhaler Inhale 2 puffs into the lungs every 6 (six) hours as needed for wheezing or shortness of breath. 07/13/20   Barton Dubois, MD  amLODipine (NORVASC) 10 MG tablet Take 10 mg by mouth in the morning.    [provider]  aspirin EC 81 MG tablet Take 81 mg by mouth in the morning. Swallow whole.    [provider]  atorvastatin (LIPITOR) 20 MG tablet Take 1 tablet by mouth daily. 06/19/20   [provider]  B-D UF III MINI PEN NEEDLES 31G X 5 MM MISC USE WITH LANTUS PEN 09/26/17   Caren Macadam, MD  diclofenac Sodium (VOLTAREN) 1 % GEL Apply 1 application topically 2 (two) times daily as needed. 04/02/20   [provider]  DULoxetine (CYMBALTA) 60 MG capsule Take 60 mg by mouth at bedtime. 10/05/18   [provider]  famotidine (PEPCID) 20 MG tablet One after supper 08/11/20   Tanda Rockers, MD  FARXIGA 10 MG TABS tablet Take 10 mg by mouth daily. 06/30/20   [provider]  gabapentin (NEURONTIN) 300 MG capsule TAKE 2 CAPSULES BY MOUTH THREE TIMES DAILY 11/24/17   Caren Macadam, MD  guaiFENesin-dextromethorphan (ROBITUSSIN DM) 100-10 MG/5ML syrup Take 10 mLs by mouth every 8 (eight) hours. 07/13/20   Barton Dubois, MD  hydrochlorothiazide (HYDRODIURIL) 25 MG tablet Take 25 mg by mouth every other day. In the morning 07/27/16   [provider]  LANTUS SOLOSTAR 100 UNIT/ML Solostar Pen Inject 50 Units into  the skin daily at 10 pm. 12/31/19   [provider]  losartan (COZAAR) 100 MG tablet TAKE 1 TABLET BY MOUTH ONCE DAILY 12/11/17   Caren Macadam, MD  metFORMIN (GLUCOPHAGE) 1000 MG tablet TAKE 1 TABLET BY MOUTH TWICE DAILY 12/11/17   Caren Macadam, MD  metoprolol tartrate (LOPRESSOR) 25 MG tablet TAKE ONE TABLET BY MOUTH TWICE DAILY 01/12/21   Isaiah Serge, NP  naproxen (NAPROSYN) 500 MG tablet Take 1 tablet (500 mg total) by mouth 2 (two) times daily. 03/05/21   Maudie Flakes, MD  nitroGLYCERIN (NITROSTAT) 0.4 MG SL tablet Place 1  tablet (0.4 mg total) under the tongue every 5 (five) minutes as needed. 08/29/17   Herminio Commons, MD  OZEMPIC, 0.25 OR 0.5 MG/DOSE, 2 MG/1.5ML SOPN Inject 0.5 mg into the skin every Sunday. 10/21/19   [provider]  pantoprazole (PROTONIX) 40 MG tablet Take 1 tablet (40 mg total) by mouth daily. 07/13/20 07/13/21  Barton Dubois, MD  potassium chloride SA (KLOR-CON) 20 MEQ tablet Take 1 tablet (20 mEq total) by mouth every other day. In the morning 07/13/20   Barton Dubois, MD    Family History Family History  Problem Relation Age of Onset   Asthma Brother    Cancer Mother        stomach   Cancer Father        prostate   Cancer Brother        ?   Diabetes Brother    Allergic rhinitis Neg Hx    Angioedema Neg Hx    Atopy Neg Hx    Eczema Neg Hx    Immunodeficiency Neg Hx    Urticaria Neg Hx    Colon cancer Neg Hx     Social History Social History   Tobacco Use   Smoking status: Never   Smokeless tobacco: Never  Vaping Use   Vaping Use: Never used  Substance Use Topics   Alcohol use: No   Drug use: No     Allergies   Erythromycin   Review of Systems Review of Systems Per HPI  Physical Exam Triage Vital Signs ED Triage Vitals  Enc Vitals Group     BP 12/15/21 1833 117/73     Pulse Rate 12/15/21 1833 96     Resp --      Temp 12/15/21 1833 98.8 F (37.1 C)     Temp Source 12/15/21 1833 Oral     SpO2  12/15/21 1833 91 %     Weight --      Height --      Head Circumference --      Peak Flow --      Pain Score 12/15/21 1836 0     Pain Loc --      Pain Edu? --      Excl. in [place]? --    No data found.  Updated Vital Signs BP 117/73 (BP Location: Left Arm)   Pulse 96   Temp 98.8 F (37.1 C) (Oral)   SpO2 91%   Visual Acuity Right Eye Distance:   Left Eye Distance:   Bilateral Distance:    Right Eye Near:   Left Eye Near:    Bilateral Near:     Physical Exam Vitals and nursing note reviewed.  Constitutional:      General: She is not in acute distress.    Appearance: Normal appearance.  Eyes:     Extraocular Movements: Extraocular movements intact.     Conjunctiva/sclera: Conjunctivae normal.     Pupils: Pupils are equal, round, and reactive to light.  Pulmonary:     Effort: Pulmonary effort is normal.  Musculoskeletal:     Cervical back: Normal range of motion.  Skin:    General: Skin is warm and dry.     Findings: Laceration present.     Comments: Superficial linear laceration to the tip of the right index finger.  Laceration measures approximately 0.  5 cm.  Bleeding is controlled.  Edges are clean.  Neurological:     General: No focal deficit present.  Mental Status: She is alert and oriented to person, place, and time.  Psychiatric:        Mood and Affect: Mood normal.        Behavior: Behavior normal.      UC Treatments / Results  Labs (all labs ordered are listed, but only abnormal results are displayed) Labs Reviewed - No data to display  EKG   Radiology No results found.  Procedures Laceration Repair  Date/Time: 12/15/2021 7:08 PM  Performed by: Tish Men, NP Authorized by: Tish Men, NP   Consent:    Consent obtained:  Verbal   Consent given by:  Patient   Risks, benefits, and alternatives were discussed: yes     Risks discussed:  Infection, pain, poor cosmetic result and poor wound healing Universal  protocol:    Procedure explained and questions answered to patient or proxy's satisfaction: yes     Patient identity confirmed:  Verbally with patient Anesthesia:    Anesthesia method:  None Laceration details:    Location:  Finger   Finger location:  R index finger   Wound length (cm): 0.5 cm.   Laceration depth: Superficial. Treatment:    Wound cleansed with: Wound cleanser.   Amount of cleaning:  Standard   Irrigation method:  Tap Skin repair:    Repair method:  Tissue adhesive Repair type:    Repair type:  Simple Post-procedure details:    Dressing:  Non-adherent dressing   Procedure completion:  Tolerated  (including critical care time)  Medications Ordered in UC Medications  Tdap (BOOSTRIX) injection 0.5 mL (0.5 mLs Intramuscular Given 12/15/21 1908)    Initial Impression / Assessment and Plan / UC Course  I have reviewed the triage vital signs and the nursing notes.  Pertinent labs & imaging results that were available during my care of the patient were reviewed by me and considered in my medical decision making (see chart for details).  Patient presents with laceration to the right index finger as result of taking her hand in dishwater hitting a knife.  On exam, patient has a 0.5 cm laceration to the tip of the right index finger, laceration is superficial, bleeding is controlled at this time.  Was able to repair the laceration with Dermabond.  Patient tolerated well.  Nonadherent dressing was applied.  Tdap was updated.  Patient was provided wound care instructions, she was also provided strict indications of when to follow-up.  Patient verbalizes understanding.  All questions were answered. Final Clinical Impressions(s) / UC Diagnoses   Final diagnoses:  Laceration of right index finger without foreign body without damage to nail, initial encounter     Discharge Instructions      Laceration to the right index finger was repaired with Dermabond.   Your tetanus  shot was updated today, which is good for the next 10 years. Do not pick or disrupt the adhesive. Keep the dressing in place for 24 hours.Remove the dressing and clean the area with warm water in 24 hours.  When you are at home, may leave the area open to air.  When you are out, recommend keeping the area covered. May apply Neosporin to area as needed. May take over-the-counter ibuprofen or Tylenol for pain or discomfort. Follow-up immediately if you develop swelling, increased redness that goes into the hand or up the arm, drainage, or if you develop fever, chills, or other concerns. Follow-up in this clinic if you develop any concerns.     ED  Prescriptions   None    PDMP not reviewed this encounter.   Tish Men, NP 12/15/21 1925

## 2021-12-15 NOTE — ED Notes (Signed)
Site dressed with non-adherent pad and secured with coban. Pt tolerated well.  Site management reviewed and infection prevention education provided. Pt verbalized understanding.

## 2021-12-20 DIAGNOSIS — E785 Hyperlipidemia, unspecified: Secondary | ICD-10-CM | POA: Diagnosis not present

## 2021-12-20 DIAGNOSIS — I25118 Atherosclerotic heart disease of native coronary artery with other forms of angina pectoris: Secondary | ICD-10-CM | POA: Diagnosis not present

## 2021-12-20 DIAGNOSIS — E118 Type 2 diabetes mellitus with unspecified complications: Secondary | ICD-10-CM | POA: Diagnosis not present

## 2021-12-20 DIAGNOSIS — I10 Essential (primary) hypertension: Secondary | ICD-10-CM | POA: Diagnosis not present

## 2021-12-29 ENCOUNTER — Other Ambulatory Visit: Payer: Self-pay | Admitting: Orthopedic Surgery

## 2021-12-29 ENCOUNTER — Telehealth: Payer: Self-pay | Admitting: Cardiology

## 2021-12-29 DIAGNOSIS — G5623 Lesion of ulnar nerve, bilateral upper limbs: Secondary | ICD-10-CM | POA: Diagnosis not present

## 2021-12-29 DIAGNOSIS — G5603 Carpal tunnel syndrome, bilateral upper limbs: Secondary | ICD-10-CM | POA: Diagnosis not present

## 2021-12-29 NOTE — Telephone Encounter (Signed)
   Pre-operative Risk Assessment    Patient Name: Ann Roberson  DOB: 05-Aug-1950 MRN: [id number]      Request for Surgical Clearance    Procedure:   Left Carpal Tunnel Release   Date of Surgery:  Clearance 02/03/22                                 Surgeon:  Dr. Leanora Cover Surgeon's Group or Practice Name:  [facility] Phone number:  [phone] Fax number:  [phone]   Type of Clearance Requested:   - Medical  - Pharmacy:  Hold Aspirin Need to know how many days to hold prior to surgery   Type of Anesthesia:   Choice    Additional requests/questions:    Signed, April Henson   12/29/2021, 11:41 AM

## 2021-12-29 NOTE — Telephone Encounter (Signed)
   Primary Cardiologist: Freada Bergeron, MD  Chart reviewed as part of pre-operative protocol coverage. Given past medical history and time since last visit, based on ACC/AHA guidelines, SHANTY GINTY would be at acceptable risk for the planned procedure without further cardiovascular testing.   Ideally aspirin should be continued without interruption, however if the bleeding risk is too great, aspirin may be held for 7 days prior to surgery. Please resume aspirin post operatively when it is felt to be safe from a bleeding standpoint.   I will route this recommendation to the requesting party via Epic fax function and remove from pre-op pool.  Please call with questions.  Emmaline Life, NP-C  12/29/2021, 12:36 PM [address], [address] Office [phone] Fax [phone]

## 2022-01-10 ENCOUNTER — Other Ambulatory Visit: Payer: Self-pay | Admitting: Cardiology

## 2022-01-13 DIAGNOSIS — E118 Type 2 diabetes mellitus with unspecified complications: Secondary | ICD-10-CM | POA: Diagnosis not present

## 2022-01-13 DIAGNOSIS — E785 Hyperlipidemia, unspecified: Secondary | ICD-10-CM | POA: Diagnosis not present

## 2022-01-13 DIAGNOSIS — I25118 Atherosclerotic heart disease of native coronary artery with other forms of angina pectoris: Secondary | ICD-10-CM | POA: Diagnosis not present

## 2022-01-13 DIAGNOSIS — I10 Essential (primary) hypertension: Secondary | ICD-10-CM | POA: Diagnosis not present

## 2022-01-19 DIAGNOSIS — R600 Localized edema: Secondary | ICD-10-CM | POA: Diagnosis not present

## 2022-01-19 DIAGNOSIS — G5603 Carpal tunnel syndrome, bilateral upper limbs: Secondary | ICD-10-CM | POA: Diagnosis not present

## 2022-01-19 DIAGNOSIS — M25552 Pain in left hip: Secondary | ICD-10-CM | POA: Diagnosis not present

## 2022-01-19 DIAGNOSIS — E118 Type 2 diabetes mellitus with unspecified complications: Secondary | ICD-10-CM | POA: Diagnosis not present

## 2022-01-19 DIAGNOSIS — I10 Essential (primary) hypertension: Secondary | ICD-10-CM | POA: Diagnosis not present

## 2022-01-25 ENCOUNTER — Encounter (HOSPITAL_BASED_OUTPATIENT_CLINIC_OR_DEPARTMENT_OTHER): Payer: Self-pay | Admitting: Orthopedic Surgery

## 2022-01-31 ENCOUNTER — Encounter (HOSPITAL_BASED_OUTPATIENT_CLINIC_OR_DEPARTMENT_OTHER)
Admission: RE | Admit: 2022-01-31 | Discharge: 2022-01-31 | Disposition: A | Discharge status: Home / Self Care | Payer: Medicare Other | Source: Ambulatory Visit | Attending: Orthopedic Surgery | Admitting: Orthopedic Surgery

## 2022-01-31 DIAGNOSIS — Z01812 Encounter for preprocedural laboratory examination: Secondary | ICD-10-CM | POA: Diagnosis not present

## 2022-01-31 LAB — BASIC METABOLIC PANEL
Anion gap: 5 (ref 5–15)
BUN: 28 mg/dL — ABNORMAL HIGH (ref 8–23)
CO2: 23 mmol/L (ref 22–32)
Calcium: 9.1 mg/dL (ref 8.9–10.3)
Chloride: 111 mmol/L (ref 98–111)
Creatinine, Ser: 1.21 mg/dL — ABNORMAL HIGH (ref 0.44–1.00)
GFR, Estimated: 49 mL/min — ABNORMAL LOW (ref >60)
Glucose, Bld: 100 mg/dL — ABNORMAL HIGH (ref 70–99)
Potassium: 5.4 mmol/L — ABNORMAL HIGH (ref 3.5–5.1)
Sodium: 139 mmol/L (ref 135–145)

## 2022-01-31 NOTE — Progress Notes (Signed)

## 2022-02-01 ENCOUNTER — Ambulatory Visit (INDEPENDENT_AMBULATORY_CARE_PROVIDER_SITE_OTHER): Payer: Medicare Other | Admitting: Orthopaedic Surgery

## 2022-02-01 ENCOUNTER — Encounter: Payer: Self-pay | Admitting: Orthopaedic Surgery

## 2022-02-01 VITALS — Ht 64.0 in | Wt 250.0 lb

## 2022-02-01 DIAGNOSIS — M170 Bilateral primary osteoarthritis of knee: Secondary | ICD-10-CM

## 2022-02-01 DIAGNOSIS — M7062 Trochanteric bursitis, left hip: Secondary | ICD-10-CM

## 2022-02-01 DIAGNOSIS — M5440 Lumbago with sciatica, unspecified side: Secondary | ICD-10-CM

## 2022-02-01 DIAGNOSIS — M545 Low back pain, unspecified: Secondary | ICD-10-CM | POA: Insufficient documentation

## 2022-02-01 NOTE — Progress Notes (Signed)
K+5.4, Dr. Christella Hartigan aware, will repeat BMET day of surgery. Notified pt to hold potassium until after surgery per Dr. Kerin Salen instructions, pt verbalized understanding.

## 2022-02-01 NOTE — Progress Notes (Signed)
Office Visit Note   Patient: Ann Roberson           Date of Birth: 05-24-1950           MRN: [id number] Visit Date: 02/01/2022              Requested by: Caren Macadam, MD [place],  [place] [id number] PCP: Caren Macadam, MD   Assessment & Plan: Visit Diagnoses:  1. Greater trochanteric bursitis of left hip   2. Primary osteoarthritis of both knees   3. Acute low back pain with sciatica, sciatica laterality unspecified, unspecified back pain laterality     Plan: Ann Roberson is a pleasant 71 year old woman today who follows up for left lateral hip pain.  She was last seen in January 2023.  She has a history of trochanteric bursitis as well as lumbar stenosis and severe facet arthritis.  She also has end-stage arthritis in her knees.  She ambulates with a cane for balance.  This is mostly secondary to her knees.  Her pain today seems more consistent with trochanteric bursitis as it bothers her when she lays on it at night sometimes and has specific area to touch just superior to the trochanteric bursa.  We have discussed weight loss her current BMI is 43.  She need to lose about 35 pounds at which point may be her knees would feel better if not she could be a candidate for knee replacement surgery.  With regards to her lateral hip pain she could go forward with an injection that would be helpful.  She is having carpal tunnel surgery this week and it was recommended to her not to have any steroid shots obviously.  She can come back in a couple weeks if she would like to do this.  While she does have significant arthritis in her lumbar spine by MRI this seems to be not as problematic as her lateral hip pain and her knees she is going to continue on weight loss and is found Ozempic to be quite helpful.  Follow-Up Instructions: Return if symptoms worsen or fail to improve.   Orders:  No orders of the defined types were placed in this encounter.  No orders of the defined  types were placed in this encounter.     Procedures: No procedures performed   Clinical Data: No additional findings.   Subjective: Chief Complaint  Patient presents with   Left Hip - Pain    HPI Ann Roberson comes in today to follow-up on her left lateral hip pain.  She has a history of bilateral knee arthritis and advanced arthritic changes in her lumbar spine.  It is her lateral hip pain that is bothering her today.  She does not have any radicular symptoms and the pain is focally in the area superior to the trochanteric bursa.  Last seen almost a year ago with a local cortisone injection that made a big difference area of the greater trochanter  Review of Systems  All other systems reviewed and are negative.    Objective: Vital Signs: Ht '5\' 4"'$  (1.626 m)   Wt 250 lb (113.4 kg)   BMI 42.91 kg/m   Physical Exam Vitals reviewed.  Constitutional:      Appearance: Normal appearance.  Pulmonary:     Effort: Pulmonary effort is normal.  Skin:    General: Skin is warm and dry.  Neurological:     Mental Status: She is alert.  Psychiatric:  Mood and Affect: Mood normal.     Ortho Exam Examination of her left hip she has no pain in the groin no pain with manipulation she is distally neurovascular intact.  She has a focal area of tenderness superior to the trochanteric bursa slightly posterior.  No tenderness over the lumbar spine. Specialty Comments:  No specialty comments available.  Imaging: No results found.   PMFS History: Patient Active Problem List   Diagnosis Date Noted   Low back pain 02/01/2022   Greater trochanteric bursitis of left hip 03/24/2021   Wheezing    Acute respiratory failure with hypoxia ([place]) 07/12/2020   Multiple thyroid nodules 07/12/2020   H/O adenomatous polyp of colon 05/18/2020   Bilateral primary osteoarthritis of knee 04/08/2020   Nausea vomiting and diarrhea 10/24/2018   COVID-19 virus infection 10/23/2018   DOE (dyspnea  on exertion) 10/23/2018   Tubulovillous adenoma of colon 01/09/2018   Excessive ear wax 06/25/2017   Knee osteoarthritis 06/17/2017   Anemia in other chronic diseases classified elsewhere 06/17/2017   Type 2 diabetes mellitus with other specified complication ([place]) 24/26/8341   Upper airway cough syndrome 05/02/2017   Obesity, Class III, BMI 40-49.9 (morbid obesity) ([place]) 05/02/2017   Dyspnea on exertion 05/01/2017   Hypertension associated with diabetes ([place]) 04/06/2017   Hyperlipidemia associated with type 2 diabetes mellitus ([place]) 04/06/2017   Urticaria 11/17/2015   Angioedema 11/17/2015   Pain in joint, lower leg 12/10/2013   Past Medical History:  Diagnosis Date   Anemia    Arthritis    Diabetes mellitus    GERD (gastroesophageal reflux disease)    Heart murmur    Hyperlipidemia    Hypertension    Neuropathy    Sarcoidosis     Family History  Problem Relation Age of Onset   Asthma Brother    Cancer Mother        stomach   Cancer Father        prostate   Cancer Brother        ?   Diabetes Brother    Allergic rhinitis Neg Hx    Angioedema Neg Hx    Atopy Neg Hx    Eczema Neg Hx    Immunodeficiency Neg Hx    Urticaria Neg Hx    Colon cancer Neg Hx     Past Surgical History:  Procedure Laterality Date   BIOPSY  02/08/2018   Procedure: BIOPSY;  Surgeon: Daneil Dolin, MD;  Location: [facility];  Service: Endoscopy;;   CESAREAN SECTION     CHOLECYSTECTOMY     COLONOSCOPY WITH PROPOFOL N/A 07/06/2017   Dr. Gala Romney: Diverticulosis.five 8 to 16 mm polyps in the ascending colon and in the cecum, removed with hot snare.  Largest polyp could not be removed completely, status post inking.  Pathology revealed cecal tubular adenoma, a sending colon polyps with multiple fragments of tubular adenomas, tubulovillous adenoma, no high-grade dysplasia or malignancy.  Tubular adenomas removed from the rectum as    COLONOSCOPY WITH PROPOFOL N/A 02/08/2018   12 mm tubular adenoma  removed from the ileocecal valve, hyperplastic sigmoid colon polyp removed.  Possible residual polypoid tissue at the ascending colon segment previously tattooed, biopsied (tubular adenoma) and ablated with APC.  Recommend 2-year surveillance study.   COLONOSCOPY WITH PROPOFOL N/A 07/06/2020   Procedure: COLONOSCOPY WITH PROPOFOL;  Surgeon: Daneil Dolin, MD;  Location: [facility];  Service: Endoscopy;  Laterality: N/A;  am appt, diabetic. general anesthesia; intubation  to control respirations. Add extra 15 min - needs to be 1st case per Melanie   MASTECTOMY, PARTIAL Left    benign   POLYPECTOMY  07/06/2017   Procedure: POLYPECTOMY;  Surgeon: Daneil Dolin, MD;  Location: [facility];  Service: Endoscopy;;  cecal polyp cs, ascending colon polyp hs   POLYPECTOMY  02/08/2018   Procedure: POLYPECTOMY;  Surgeon: Daneil Dolin, MD;  [facility]: [facility];  Service: Endoscopy;;   POLYPECTOMY  07/06/2020   Procedure: POLYPECTOMY INTESTINAL;  Surgeon: Daneil Dolin, MD;  Location: [facility];  Service: Endoscopy;;   TUBAL LIGATION     Social History   Occupational History   Not on file  Tobacco Use   Smoking status: Never   Smokeless tobacco: Never  Vaping Use   Vaping Use: Never used  Substance and Sexual Activity   Alcohol use: No   Drug use: No   Sexual activity: Not Currently    Birth control/protection: Post-menopausal

## 2022-02-03 ENCOUNTER — Encounter (HOSPITAL_BASED_OUTPATIENT_CLINIC_OR_DEPARTMENT_OTHER): Payer: Self-pay | Admitting: Orthopedic Surgery

## 2022-02-03 ENCOUNTER — Ambulatory Visit (HOSPITAL_BASED_OUTPATIENT_CLINIC_OR_DEPARTMENT_OTHER): Payer: Medicare Other | Admitting: Anesthesiology

## 2022-02-03 ENCOUNTER — Ambulatory Visit (HOSPITAL_BASED_OUTPATIENT_CLINIC_OR_DEPARTMENT_OTHER)
Admission: RE | Admit: 2022-02-03 | Discharge: 2022-02-03 | Disposition: A | Discharge status: Home / Self Care | Payer: Medicare Other | Source: Ambulatory Visit | Attending: Orthopedic Surgery | Admitting: Orthopedic Surgery

## 2022-02-03 ENCOUNTER — Other Ambulatory Visit: Payer: Self-pay

## 2022-02-03 ENCOUNTER — Encounter (HOSPITAL_BASED_OUTPATIENT_CLINIC_OR_DEPARTMENT_OTHER)
Admission: RE | Disposition: A | Discharge status: Home / Self Care | Payer: Self-pay | Source: Ambulatory Visit | Attending: Orthopedic Surgery

## 2022-02-03 DIAGNOSIS — I119 Hypertensive heart disease without heart failure: Secondary | ICD-10-CM | POA: Diagnosis not present

## 2022-02-03 DIAGNOSIS — Z794 Long term (current) use of insulin: Secondary | ICD-10-CM | POA: Diagnosis not present

## 2022-02-03 DIAGNOSIS — E119 Type 2 diabetes mellitus without complications: Secondary | ICD-10-CM | POA: Diagnosis not present

## 2022-02-03 DIAGNOSIS — I2510 Atherosclerotic heart disease of native coronary artery without angina pectoris: Secondary | ICD-10-CM | POA: Diagnosis not present

## 2022-02-03 DIAGNOSIS — Z833 Family history of diabetes mellitus: Secondary | ICD-10-CM | POA: Insufficient documentation

## 2022-02-03 DIAGNOSIS — Z7984 Long term (current) use of oral hypoglycemic drugs: Secondary | ICD-10-CM | POA: Diagnosis not present

## 2022-02-03 DIAGNOSIS — E1140 Type 2 diabetes mellitus with diabetic neuropathy, unspecified: Secondary | ICD-10-CM | POA: Insufficient documentation

## 2022-02-03 DIAGNOSIS — I10 Essential (primary) hypertension: Secondary | ICD-10-CM | POA: Insufficient documentation

## 2022-02-03 DIAGNOSIS — M1990 Unspecified osteoarthritis, unspecified site: Secondary | ICD-10-CM | POA: Diagnosis not present

## 2022-02-03 DIAGNOSIS — K219 Gastro-esophageal reflux disease without esophagitis: Secondary | ICD-10-CM | POA: Insufficient documentation

## 2022-02-03 DIAGNOSIS — G5602 Carpal tunnel syndrome, left upper limb: Secondary | ICD-10-CM | POA: Diagnosis not present

## 2022-02-03 DIAGNOSIS — Z79899 Other long term (current) drug therapy: Secondary | ICD-10-CM | POA: Diagnosis not present

## 2022-02-03 DIAGNOSIS — Z01812 Encounter for preprocedural laboratory examination: Secondary | ICD-10-CM

## 2022-02-03 HISTORY — PX: CARPAL TUNNEL RELEASE: SHX101

## 2022-02-03 LAB — BASIC METABOLIC PANEL
Anion gap: 8 (ref 5–15)
BUN: 24 mg/dL — ABNORMAL HIGH (ref 8–23)
CO2: 20 mmol/L — ABNORMAL LOW (ref 22–32)
Calcium: 9.4 mg/dL (ref 8.9–10.3)
Chloride: 108 mmol/L (ref 98–111)
Creatinine, Ser: 1.12 mg/dL — ABNORMAL HIGH (ref 0.44–1.00)
GFR, Estimated: 53 mL/min — ABNORMAL LOW (ref >60)
Glucose, Bld: 118 mg/dL — ABNORMAL HIGH (ref 70–99)
Potassium: 4.9 mmol/L (ref 3.5–5.1)
Sodium: 136 mmol/L (ref 135–145)

## 2022-02-03 LAB — GLUCOSE, CAPILLARY
Glucose-Capillary: 114 mg/dL — ABNORMAL HIGH (ref 70–99)
Glucose-Capillary: 107 mg/dL — ABNORMAL HIGH (ref 70–99)

## 2022-02-03 SURGERY — CARPAL TUNNEL RELEASE
Anesthesia: Monitor Anesthesia Care | Site: Hand | Laterality: Left

## 2022-02-03 MED ORDER — DEXAMETHASONE SODIUM PHOSPHATE 10 MG/ML IJ SOLN
INTRAMUSCULAR | Status: AC
  Filled 2022-02-03: qty 1

## 2022-02-03 MED ORDER — BUPIVACAINE HCL (PF) 0.25 % IJ SOLN
INTRAMUSCULAR | Status: DC | PRN
  Administered 2022-02-03: 9 mL

## 2022-02-03 MED ORDER — ONDANSETRON HCL 4 MG/2ML IJ SOLN
INTRAMUSCULAR | Status: AC
  Filled 2022-02-03: qty 2

## 2022-02-03 MED ORDER — FENTANYL CITRATE (PF) 100 MCG/2ML IJ SOLN
INTRAMUSCULAR | Status: AC
  Filled 2022-02-03: qty 2

## 2022-02-03 MED ORDER — LIDOCAINE 2% (20 MG/ML) 5 ML SYRINGE
INTRAMUSCULAR | Status: AC
  Filled 2022-02-03: qty 5

## 2022-02-03 MED ORDER — MIDAZOLAM HCL 5 MG/5ML IJ SOLN
INTRAMUSCULAR | Status: DC | PRN
  Administered 2022-02-03: 1 mg via INTRAVENOUS

## 2022-02-03 MED ORDER — LACTATED RINGERS IV SOLN: INTRAVENOUS | Status: DC

## 2022-02-03 MED ORDER — CEFAZOLIN SODIUM-DEXTROSE 2-4 GM/100ML-% IV SOLN
2.0000 g | INTRAVENOUS | Status: AC
  Administered 2022-02-03: 2 g via INTRAVENOUS

## 2022-02-03 MED ORDER — PROPOFOL 500 MG/50ML IV EMUL
INTRAVENOUS | Status: DC | PRN
  Administered 2022-02-03: 50 ug/kg/min via INTRAVENOUS

## 2022-02-03 MED ORDER — MIDAZOLAM HCL 2 MG/2ML IJ SOLN
INTRAMUSCULAR | Status: AC
  Filled 2022-02-03: qty 2

## 2022-02-03 MED ORDER — ACETAMINOPHEN 500 MG PO TABS
ORAL_TABLET | ORAL | Status: AC
  Filled 2022-02-03: qty 2

## 2022-02-03 MED ORDER — LIDOCAINE HCL (PF) 0.5 % IJ SOLN
INTRAMUSCULAR | Status: DC | PRN
  Administered 2022-02-03: 30 mL via INTRAVENOUS

## 2022-02-03 MED ORDER — HYDROCODONE-ACETAMINOPHEN 5-325 MG PO TABS: ORAL_TABLET | ORAL | 0 refills | Status: DC

## 2022-02-03 MED ORDER — 0.9 % SODIUM CHLORIDE (POUR BTL) OPTIME
TOPICAL | Status: DC | PRN
  Administered 2022-02-03: 75 mL

## 2022-02-03 MED ORDER — FENTANYL CITRATE (PF) 100 MCG/2ML IJ SOLN: 25.0000 ug | INTRAMUSCULAR | Status: DC | PRN

## 2022-02-03 MED ORDER — PROPOFOL 500 MG/50ML IV EMUL
INTRAVENOUS | Status: AC
  Filled 2022-02-03: qty 50

## 2022-02-03 MED ORDER — PHENYLEPHRINE HCL (PRESSORS) 10 MG/ML IV SOLN
INTRAVENOUS | Status: DC | PRN
  Administered 2022-02-03: 80 ug via INTRAVENOUS

## 2022-02-03 MED ORDER — BUPIVACAINE HCL (PF) 0.25 % IJ SOLN
INTRAMUSCULAR | Status: AC
  Filled 2022-02-03: qty 90

## 2022-02-03 MED ORDER — ONDANSETRON HCL 4 MG/2ML IJ SOLN
INTRAMUSCULAR | Status: DC | PRN
  Administered 2022-02-03: 4 mg via INTRAVENOUS

## 2022-02-03 MED ORDER — ACETAMINOPHEN 500 MG PO TABS
1000.0000 mg | ORAL_TABLET | Freq: Once | ORAL | Status: AC
  Administered 2022-02-03: 1000 mg via ORAL

## 2022-02-03 MED ORDER — CEFAZOLIN SODIUM-DEXTROSE 2-4 GM/100ML-% IV SOLN
INTRAVENOUS | Status: AC
  Filled 2022-02-03: qty 100

## 2022-02-03 MED ORDER — FENTANYL CITRATE (PF) 100 MCG/2ML IJ SOLN
INTRAMUSCULAR | Status: DC | PRN
  Administered 2022-02-03: 50 ug via INTRAVENOUS

## 2022-02-03 SURGICAL SUPPLY — 38 items
APL PRP STRL LF DISP 70% ISPRP (MISCELLANEOUS) ×2
BLADE SURG 15 STRL LF DISP TIS (BLADE) ×2 IMPLANT
BLADE SURG 15 STRL SS (BLADE) ×2
BNDG CMPR 9X4 STRL LF SNTH (GAUZE/BANDAGES/DRESSINGS) ×1
BNDG ELASTIC 3X5.8 VLCR STR LF (GAUZE/BANDAGES/DRESSINGS) ×1 IMPLANT
BNDG ESMARK 4X9 LF (GAUZE/BANDAGES/DRESSINGS) IMPLANT
BNDG GAUZE DERMACEA FLUFF 4 (GAUZE/BANDAGES/DRESSINGS) ×1 IMPLANT
BNDG GZE DERMACEA 4 6PLY (GAUZE/BANDAGES/DRESSINGS) ×1
CHLORAPREP W/TINT 26 (MISCELLANEOUS) ×1 IMPLANT
CORD BIPOLAR FORCEPS 12FT (ELECTRODE) ×1 IMPLANT
COVER BACK TABLE 60X90IN (DRAPES) ×1 IMPLANT
COVER MAYO STAND STRL (DRAPES) ×1 IMPLANT
CUFF TOURN SGL QUICK 18X4 (TOURNIQUET CUFF) ×1 IMPLANT
DRAPE EXTREMITY T 121X128X90 (DISPOSABLE) ×1 IMPLANT
DRAPE SURG 17X23 STRL (DRAPES) ×1 IMPLANT
GAUZE PAD ABD 8X10 STRL (GAUZE/BANDAGES/DRESSINGS) ×1 IMPLANT
GAUZE SPONGE 4X4 12PLY STRL (GAUZE/BANDAGES/DRESSINGS) ×1 IMPLANT
GAUZE XEROFORM 1X8 LF (GAUZE/BANDAGES/DRESSINGS) ×1 IMPLANT
GLOVE BIO SURGEON STRL SZ7.5 (GLOVE) ×1 IMPLANT
GLOVE BIOGEL PI IND STRL 7.0 (GLOVE) IMPLANT
GLOVE BIOGEL PI IND STRL 7.5 (GLOVE) IMPLANT
GLOVE BIOGEL PI IND STRL 8 (GLOVE) ×1 IMPLANT
GLOVE SURG SS PI 6.5 STRL IVOR (GLOVE) IMPLANT
GOWN STRL REUS W/ TWL LRG LVL3 (GOWN DISPOSABLE) ×1 IMPLANT
GOWN STRL REUS W/ TWL XL LVL3 (GOWN DISPOSABLE) IMPLANT
GOWN STRL REUS W/TWL LRG LVL3 (GOWN DISPOSABLE) ×1
GOWN STRL REUS W/TWL XL LVL3 (GOWN DISPOSABLE) ×2 IMPLANT
NDL HYPO 25X1 1.5 SAFETY (NEEDLE) ×1 IMPLANT
NEEDLE HYPO 25X1 1.5 SAFETY (NEEDLE) ×1 IMPLANT
NS IRRIG 1000ML POUR BTL (IV SOLUTION) ×1 IMPLANT
PACK BASIN DAY SURGERY FS (CUSTOM PROCEDURE TRAY) ×1 IMPLANT
PADDING CAST ABS COTTON 4X4 ST (CAST SUPPLIES) ×1 IMPLANT
STOCKINETTE 4X48 STRL (DRAPES) ×1 IMPLANT
SUT ETHILON 4 0 PS 2 18 (SUTURE) ×1 IMPLANT
SYR BULB EAR ULCER 3OZ GRN STR (SYRINGE) ×1 IMPLANT
SYR CONTROL 10ML LL (SYRINGE) ×1 IMPLANT
TOWEL GREEN STERILE FF (TOWEL DISPOSABLE) ×2 IMPLANT
UNDERPAD 30X36 HEAVY ABSORB (UNDERPADS AND DIAPERS) ×1 IMPLANT

## 2022-02-03 NOTE — Op Note (Signed)
02/03/2022 [facility]                              OPERATIVE REPORT   PREOPERATIVE DIAGNOSIS:  Left carpal tunnel syndrome.  POSTOPERATIVE DIAGNOSIS:  Left carpal tunnel syndrome.  PROCEDURE:  Left carpal tunnel release.  SURGEON:  Leanora Cover, MD  ASSISTANT:  none.  ANESTHESIA: Bier block with sedation  IV FLUIDS:  Per anesthesia flow sheet.  ESTIMATED BLOOD LOSS:  Minimal.  COMPLICATIONS:  None.  SPECIMENS:  None.  TOURNIQUET TIME:    Total Tourniquet Time Documented: Forearm (Left) - 24 minutes Total: Forearm (Left) - 24 minutes   DISPOSITION:  Stable to PACU.  LOCATION: [facility]  INDICATIONS:  71 yo female with numbness and tingling left hand.  Nocturnal symptoms.  Positive nerve conduction studies.  She wishes to have a carpal tunnel release for management of her symptoms.  Risks, benefits and alternatives of surgery were discussed including the risk of blood loss; infection; damage to nerves, vessels, tendons, ligaments, bone; failure of surgery; need for additional surgery; complications with wound healing; continued pain; recurrence of carpal tunnel syndrome; and damage to motor branch. She voiced understanding of these risks and elected to proceed.   OPERATIVE COURSE:  After being identified preoperatively by myself, the patient and I agreed upon the procedure and site of procedure.  The surgical site was marked.  Surgical consent had been signed.  She was given IV Ancef as preoperative antibiotic prophylaxis.  She was transferred to the operating room and placed on the operating room table in supine position with the Left upper extremity on an armboard.  Bier block anesthesia was induced by the anesthesiologist.  Left upper extremity was prepped and draped in normal sterile orthopaedic fashion.  A surgical pause was performed between the surgeons, anesthesia, and operating room staff, and all were in agreement as to the patient,  procedure, and site of procedure.  Tourniquet at the proximal aspect of the forearm had been inflated for the Bier block  Incision was made over the transverse carpal ligament and carried into the subcutaneous tissues by spreading technique.  Bipolar electrocautery was used to obtain hemostasis.  The palmar fascia was sharply incised.  The transverse carpal ligament was identified.  The fascia distal to the ligament was opened.  Retractor was placed and the flexor tendons were identified.  The flexor tendon to the ring finger was identified and retracted radially.  The transverse carpal ligament was then incised from distal to proximal under direct visualization.  Scissors were used to split the distal aspect of the volar antebrachial fascia.  A finger was placed into the wound to ensure complete decompression, which was the case.  The nerve was examined.  It was adherent to the radial leaflet.  The motor branch was identified and was intact.  The wound was copiously irrigated with sterile saline.  It was then closed with 4-0 nylon in a horizontal mattress fashion.  It was injected with 0.25% plain Marcaine to aid in postoperative analgesia.  It was dressed with sterile Xeroform, 4x4s, an ABD, and wrapped with Kerlix and an Ace bandage.  Tourniquet was deflated at 24 minutes.  Fingertips were pink with brisk capillary refill after deflation of the tourniquet.  Operative drapes were broken down.  The patient was awoken from anesthesia safely.  She was transferred back to stretcher and taken to the PACU in stable  condition.  I will see her back in the office in 1 week for postoperative followup.  I will give her a prescription for Norco 5/325 1-2 tabs PO q6 hours prn pain, dispense # 20.    Leanora Cover, MD Electronically signed, 02/03/22

## 2022-02-03 NOTE — Anesthesia Preprocedure Evaluation (Signed)
Anesthesia Evaluation  Patient identified by MRN, date of birth, ID band Patient awake    Reviewed: Allergy & Precautions, NPO status , Patient's Chart, lab work & pertinent test results  History of Anesthesia Complications Negative for: history of anesthetic complications  Airway Mallampati: III  TM Distance: >3 FB Neck ROM: Full    Dental  (+) Teeth Intact, Dental Advisory Given   Pulmonary neg pulmonary ROS   breath sounds clear to auscultation       Cardiovascular hypertension, Pt. on medications + CAD and + DOE  + Valvular Problems/Murmurs AS  Rhythm:Regular + Systolic murmurs 1. Left ventricular ejection fraction, by estimation, is 60 to 65%. The  left ventricle has normal function. The left ventricle has no regional  wall motion abnormalities. There is mild concentric left ventricular  hypertrophy. Left ventricular diastolic  parameters were normal.   2. Right ventricular systolic function is normal. The right ventricular  size is normal.   3. Trivial mitral valve regurgitation.   4. AV leaflets are difficult to see wall. It is thickened, calcified Peak  and mean gradients through the valve are 29 and 16 mm Hg respectively AVA  (VTI) is 1 cm2. Dimensionless index is 0.53 consistent with mild AS.  Compared to echo report from March  2022, mean gradient is relatively unchanged. Aortic valve regurgitation is  not visualized. Aortic valve sclerosis is present, with no evidence of  aortic valve stenosis.   5. The inferior vena cava is normal in size with greater than 50%  respiratory variability, suggesting right atrial pressure of 3 mmHg.     Neuro/Psych negative neurological ROS  negative psych ROS   GI/Hepatic Neg liver ROS,GERD  ,,  Endo/Other  diabetes    Renal/GU Renal diseaseLab Results      Component                Value               Date                      CREATININE               1.12 (H)             02/03/2022                Musculoskeletal  (+) Arthritis ,    Abdominal   Peds  Hematology  (+) Blood dyscrasia, anemia Lab Results      Component                Value               Date                      WBC                      5.2                 12/21/2020                HGB                      11.6 (L)            12/21/2020                HCT  38.2                12/21/2020                MCV                      92.0                12/21/2020                PLT                      271                 12/21/2020              Anesthesia Other Findings   Reproductive/Obstetrics                              Anesthesia Physical Anesthesia Plan  ASA: 3  Anesthesia Plan: Bier Block and MAC and Bier Block-Lidocaine Only   Post-op Pain Management: Tylenol PO (pre-op)*   Induction:   PONV Risk Score and Plan: 2 and Ondansetron and Propofol infusion  Airway Management Planned: Nasal Cannula and Natural Airway  Additional Equipment: None  Intra-op Plan:   Post-operative Plan:   Informed Consent: I have reviewed the patients History and Physical, chart, labs and discussed the procedure including the risks, benefits and alternatives for the proposed anesthesia with the patient or authorized representative who has indicated his/her understanding and acceptance.     Dental advisory given  Plan Discussed with: CRNA  Anesthesia Plan Comments:          Anesthesia Quick Evaluation

## 2022-02-03 NOTE — Discharge Instructions (Addendum)
No Tylenol until after 2:15pm today if needed  Post Anesthesia Home Care Instructions  Activity: Get plenty of rest for the remainder of the day. A responsible individual must stay with you for 24 hours following the procedure.  For the next 24 hours, DO NOT: -Drive a car -Paediatric nurse -Drink alcoholic beverages -Take any medication unless instructed by your physician -Make any legal decisions or sign important papers.  Meals: Start with liquid foods such as gelatin or soup. Progress to regular foods as tolerated. Avoid greasy, spicy, heavy foods. If nausea and/or vomiting occur, drink only clear liquids until the nausea and/or vomiting subsides. Call your physician if vomiting continues.  Special Instructions/Symptoms: Your throat may feel dry or sore from the anesthesia or the breathing tube placed in your throat during surgery. If this causes discomfort, gargle with warm salt water. The discomfort should disappear within 24 hours.  If you had a scopolamine patch placed behind your ear for the management of post- operative nausea and/or vomiting:  1. The medication in the patch is effective for 72 hours, after which it should be removed.  Wrap patch in a tissue and discard in the trash. Wash hands thoroughly with soap and water. 2. You may remove the patch earlier than 72 hours if you experience unpleasant side effects which may include dry mouth, dizziness or visual disturbances. 3. Avoid touching the patch. Wash your hands with soap and water after contact with the patch.     [facility] Instructions Hand Surgery  Wound Care: Keep your hand elevated above the level of your heart.  Do not allow it to dangle by your side.  Keep the dressing dry and do not remove it unless your doctor advises you to do so.  He will usually change it at the time of your post-op visit.  Moving your fingers is advised to stimulate circulation but will depend on the site of your surgery.  If you have  a splint applied, your doctor will advise you regarding movement.  Activity: Do not drive or operate machinery today.  Rest today and then you may return to your normal activity and work as indicated by your physician.  Diet:  Drink liquids today or eat a light diet.  You may resume a regular diet tomorrow.    General expectations: Pain for two to three days. Fingers may become slightly swollen.  Call your doctor if any of the following occur: Severe pain not relieved by pain medication. Elevated temperature. Dressing soaked with blood. Inability to move fingers. White or bluish color to fingers.

## 2022-02-03 NOTE — Transfer of Care (Signed)
Immediate Anesthesia Transfer of Care Note  Patient: Ann Roberson  Procedure(s) Performed: LEFT CARPAL TUNNEL RELEASE (Left: Hand)  Patient Location: PACU  Anesthesia Type:MAC and Bier block  Level of Consciousness: awake, alert , and oriented  Airway & Oxygen Therapy: Patient Spontanous Breathing  Post-op Assessment: Report given to RN and Post -op Vital signs reviewed and stable  Post vital signs: Reviewed and stable  Last Vitals:  Vitals Value Taken Time  BP    Temp    Pulse 76 02/03/22 0921  Resp 11 02/03/22 0921  SpO2 96 % 02/03/22 0921  Vitals shown include unvalidated device data.  Last Pain:  Vitals:   02/03/22 3202  TempSrc: Oral  PainSc: 5       Patients Stated Pain Goal: 4 (33/43/56 8616)  Complications: No notable events documented.

## 2022-02-03 NOTE — Anesthesia Postprocedure Evaluation (Signed)
Anesthesia Post Note  Patient: Ann Roberson  Procedure(s) Performed: LEFT CARPAL TUNNEL RELEASE (Left: Hand)     Patient location during evaluation: PACU Anesthesia Type: MAC and Bier Block Level of consciousness: awake and alert Pain management: pain level controlled Vital Signs Assessment: post-procedure vital signs reviewed and stable Respiratory status: spontaneous breathing, nonlabored ventilation and respiratory function stable Cardiovascular status: stable and blood pressure returned to baseline Postop Assessment: no apparent nausea or vomiting Anesthetic complications: no   No notable events documented.  Last Vitals:  Vitals:   02/03/22 0945 02/03/22 1033  BP: 128/79 124/69  Pulse: 76 70  Resp: 12 16  Temp:  36.4 C  SpO2: 94% 96%    Last Pain:  Vitals:   02/03/22 1033  TempSrc: Oral  PainSc: 0-No pain                 Ahlaya Ende

## 2022-02-03 NOTE — H&P (Signed)
Ann Roberson is an 71 y.o. female.   Chief Complaint: carpal tunnel syndrome HPI: 71 yo female with numbness and tingling left hand.  Nocturnal symptoms.  Positive nerve conduction studies.  She wishes to have left carpal tunnel release.  Allergies:  Allergies  Allergen Reactions   Erythromycin Diarrhea and Nausea And Vomiting    Past Medical History:  Diagnosis Date   Anemia    Arthritis    Diabetes mellitus    GERD (gastroesophageal reflux disease)    Heart murmur    Hyperlipidemia    Hypertension    Neuropathy    Sarcoidosis     Past Surgical History:  Procedure Laterality Date   BIOPSY  02/08/2018   Procedure: BIOPSY;  Surgeon: Daneil Dolin, MD;  [facility];  Service: Endoscopy;;   CESAREAN SECTION     CHOLECYSTECTOMY     COLONOSCOPY WITH PROPOFOL N/A 07/06/2017   Dr. Gala Romney: Diverticulosis.five 8 to 16 mm polyps in the ascending colon and in the cecum, removed with hot snare.  Largest polyp could not be removed completely, status post inking.  Pathology revealed cecal tubular adenoma, a sending colon polyps with multiple fragments of tubular adenomas, tubulovillous adenoma, no high-grade dysplasia or malignancy.  Tubular adenomas removed from the rectum as    COLONOSCOPY WITH PROPOFOL N/A 02/08/2018   12 mm tubular adenoma removed from the ileocecal valve, hyperplastic sigmoid colon polyp removed.  Possible residual polypoid tissue at the ascending colon segment previously tattooed, biopsied (tubular adenoma) and ablated with APC.  Recommend 2-year surveillance study.   COLONOSCOPY WITH PROPOFOL N/A 07/06/2020   Procedure: COLONOSCOPY WITH PROPOFOL;  Surgeon: Daneil Dolin, MD;  [facility];  Service: Endoscopy;  Laterality: N/A;  am appt, diabetic. general anesthesia; intubation to control respirations. Add extra 15 min - needs to be 1st case per Melanie   MASTECTOMY, PARTIAL Left    benign   POLYPECTOMY  07/06/2017   Procedure: POLYPECTOMY;   Surgeon: Daneil Dolin, MD;  [facility];  Service: Endoscopy;;  cecal polyp cs, ascending colon polyp hs   POLYPECTOMY  02/08/2018   Procedure: POLYPECTOMY;  Surgeon: Daneil Dolin, MD;  [facility];  Service: Endoscopy;;   POLYPECTOMY  07/06/2020   Procedure: POLYPECTOMY INTESTINAL;  Surgeon: Daneil Dolin, MD;  [facility];  Service: Endoscopy;;   TUBAL LIGATION      Family History: Family History  Problem Relation Age of Onset   Asthma Brother    Cancer Mother        stomach   Cancer Father        prostate   Cancer Brother        ?   Diabetes Brother    Allergic rhinitis Neg Hx    Angioedema Neg Hx    Atopy Neg Hx    Eczema Neg Hx    Immunodeficiency Neg Hx    Urticaria Neg Hx    Colon cancer Neg Hx     Social History:   reports that she has never smoked. She has never used smokeless tobacco. She reports that she does not drink alcohol and does not use drugs.  Medications: Medications Prior to Admission  Medication Sig Dispense Refill   acetaminophen (TYLENOL) 650 MG CR tablet Take 1,300 mg by mouth 2 (two) times daily.     amLODipine (NORVASC) 10 MG tablet Take 10 mg by mouth in the morning.  aspirin EC 81 MG tablet Take 81 mg by mouth in the morning. Swallow whole.     atorvastatin (LIPITOR) 20 MG tablet Take 1 tablet by mouth daily.     diclofenac Sodium (VOLTAREN) 1 % GEL Apply 1 application topically 2 (two) times daily as needed.     DULoxetine (CYMBALTA) 60 MG capsule Take 60 mg by mouth at bedtime.     FARXIGA 10 MG TABS tablet Take 10 mg by mouth daily.     gabapentin (NEURONTIN) 300 MG capsule TAKE 2 CAPSULES BY MOUTH THREE TIMES DAILY 180 capsule 1   guaiFENesin-dextromethorphan (ROBITUSSIN DM) 100-10 MG/5ML syrup Take 10 mLs by mouth every 8 (eight) hours. 118 mL 0   hydrochlorothiazide (HYDRODIURIL) 25 MG tablet Take 25 mg by mouth every other day. In the morning  1   LANTUS SOLOSTAR 100 UNIT/ML Solostar Pen  Inject 50 Units into the skin daily at 10 pm.     losartan (COZAAR) 100 MG tablet TAKE 1 TABLET BY MOUTH ONCE DAILY 90 tablet 0   metFORMIN (GLUCOPHAGE) 1000 MG tablet TAKE 1 TABLET BY MOUTH TWICE DAILY 60 tablet 0   metoprolol tartrate (LOPRESSOR) 25 MG tablet TAKE ONE TABLET BY MOUTH TWICE DAILY 180 tablet 2   naproxen (NAPROSYN) 500 MG tablet Take 1 tablet (500 mg total) by mouth 2 (two) times daily. 30 tablet 0   OZEMPIC, 0.25 OR 0.5 MG/DOSE, 2 MG/1.5ML SOPN Inject 0.5 mg into the skin every Sunday.     potassium chloride SA (KLOR-CON) 20 MEQ tablet Take 1 tablet (20 mEq total) by mouth every other day. In the morning     albuterol (VENTOLIN HFA) 108 (90 Base) MCG/ACT inhaler Inhale 2 puffs into the lungs every 6 (six) hours as needed for wheezing or shortness of breath. 8 g 2   B-D UF III MINI PEN NEEDLES 31G X 5 MM MISC USE WITH LANTUS PEN 100 each 0   nitroGLYCERIN (NITROSTAT) 0.4 MG SL tablet Place 1 tablet (0.4 mg total) under the tongue every 5 (five) minutes as needed. 25 tablet 3    Results for orders placed or performed during the hospital encounter of 02/03/22 (from the past 48 hour(s))  Basic metabolic panel     Status: Abnormal   Collection Time: 02/03/22  7:07 AM  Result Value Ref Range   Sodium 136 135 - 145 mmol/L   Potassium 4.9 3.5 - 5.1 mmol/L   Chloride 108 98 - 111 mmol/L   CO2 20 (L) 22 - 32 mmol/L   Glucose, Bld 118 (H) 70 - 99 mg/dL    Comment: Glucose reference range applies only to samples taken after fasting for at least 8 hours.   BUN 24 (H) 8 - 23 mg/dL   Creatinine, Ser 1.12 (H) 0.44 - 1.00 mg/dL   Calcium 9.4 8.9 - 10.3 mg/dL   GFR, Estimated 53 (L) >60 mL/min    Comment: (NOTE) Calculated using the CKD-EPI Creatinine Equation (2021)    Anion gap 8 5 - 15    Comment: Performed at [facility] [address] [place], [place] [address]  Glucose, capillary     Status: Abnormal   Collection Time: 02/03/22  7:08 AM  Result Value Ref Range    Glucose-Capillary 114 (H) 70 - 99 mg/dL    Comment: Glucose reference range applies only to samples taken after fasting for at least 8 hours.    No results found.    Blood pressure 132/72, pulse  71, temperature 97.8 F (36.6 C), temperature source Oral, resp. rate 18, height '5\' 4"'$  (1.626 m), weight 112.7 kg, SpO2 99 %.  General appearance: alert, cooperative, and appears stated age Head: Normocephalic, without obvious abnormality, atraumatic Neck: supple, symmetrical, trachea midline Extremities: Intact sensation and capillary refill all digits.  +epl/fpl/io.  No wounds.  Pulses: 2+ and symmetric Skin: Skin color, texture, turgor normal. No rashes or lesions Neurologic: Grossly normal Incision/Wound: none  Assessment/Plan Left carpal tunnel syndrome.  Non operative and operative treatment options have been discussed with the patient and patient wishes to proceed with operative treatment. Risks, benefits, and alternatives of surgery have been discussed and the patient agrees with the plan of care.   Leanora Cover 02/03/2022, 8:37 AM

## 2022-02-04 ENCOUNTER — Encounter (HOSPITAL_BASED_OUTPATIENT_CLINIC_OR_DEPARTMENT_OTHER): Payer: Self-pay | Admitting: Orthopedic Surgery

## 2022-02-09 DIAGNOSIS — I25118 Atherosclerotic heart disease of native coronary artery with other forms of angina pectoris: Secondary | ICD-10-CM | POA: Diagnosis not present

## 2022-02-09 DIAGNOSIS — I10 Essential (primary) hypertension: Secondary | ICD-10-CM | POA: Diagnosis not present

## 2022-02-09 DIAGNOSIS — E118 Type 2 diabetes mellitus with unspecified complications: Secondary | ICD-10-CM | POA: Diagnosis not present

## 2022-02-09 DIAGNOSIS — E785 Hyperlipidemia, unspecified: Secondary | ICD-10-CM | POA: Diagnosis not present

## 2022-03-01 ENCOUNTER — Encounter: Payer: Self-pay | Admitting: Orthopaedic Surgery

## 2022-03-01 ENCOUNTER — Ambulatory Visit (INDEPENDENT_AMBULATORY_CARE_PROVIDER_SITE_OTHER): Payer: Medicare Other | Admitting: Orthopaedic Surgery

## 2022-03-01 DIAGNOSIS — M7062 Trochanteric bursitis, left hip: Secondary | ICD-10-CM

## 2022-03-01 MED ORDER — LIDOCAINE HCL 1 % IJ SOLN
2.0000 mL | INTRAMUSCULAR | Status: AC | PRN
  Administered 2022-03-01: 2 mL

## 2022-03-01 MED ORDER — METHYLPREDNISOLONE ACETATE 40 MG/ML IJ SUSP
80.0000 mg | INTRAMUSCULAR | Status: AC | PRN
  Administered 2022-03-01: 80 mg via INTRA_ARTICULAR

## 2022-03-01 MED ORDER — BUPIVACAINE HCL 0.25 % IJ SOLN
2.0000 mL | INTRAMUSCULAR | Status: AC | PRN
  Administered 2022-03-01: 2 mL via INTRA_ARTICULAR

## 2022-03-01 NOTE — Progress Notes (Signed)
Office Visit Note   Patient: Ann Roberson           Date of Birth: [birth year]           MRN: [id number] Visit Date: 03/01/2022              Requested by: Caren Macadam, MD [place],  [place] [address] PCP: Caren Macadam, MD   Assessment & Plan: Visit Diagnoses:  1. Greater trochanteric bursitis of left hip     Plan: Ms. Reader comes in today for an injection into her left hip.  She has a history of left hip trochanteric bursitis.  A few weeks ago we were going to go forward with an injection but she was to undergo carpal tunnel surgery.  She is doing well postoperatively and request the injection today.  She is a diabetic we did counsel her that this may elevate her blood sugars for a few days.  May follow-up as needed.  Follow-Up Instructions: Return if symptoms worsen or fail to improve.   Orders:  No orders of the defined types were placed in this encounter.  No orders of the defined types were placed in this encounter.     Procedures: Large Joint Inj: L greater trochanter on 03/01/2022 1:41 PM Indications: pain and diagnostic evaluation Details: 22 G 1.5 in and 3.5 in needle, lateral approach  Arthrogram: No  Medications: 2 mL lidocaine 1 %; 80 mg methylPREDNISolone acetate 40 MG/ML; 2 mL bupivacaine 0.25 % Outcome: tolerated well, no immediate complications Procedure, treatment alternatives, risks and benefits explained, specific risks discussed. Consent was given by the patient.     Clinical Data: No additional findings.   Subjective: Chief Complaint  Patient presents with  . Left Hip - Pain, Follow-up    HPI  Review of Systems   Objective: Vital Signs: There were no vitals taken for this visit.  Physical Exam  Ortho Exam  Specialty Comments:  No specialty comments available.  Imaging: No results found.   PMFS History: Patient Active Problem List   Diagnosis Date Noted  . Low back pain 02/01/2022  . Greater  trochanteric bursitis of left hip 03/24/2021  . Wheezing   . Acute respiratory failure with hypoxia ([place]) 07/12/2020  . Multiple thyroid nodules 07/12/2020  . H/O adenomatous polyp of colon 05/18/2020  . Bilateral primary osteoarthritis of knee 04/08/2020  . Nausea vomiting and diarrhea 10/24/2018  . COVID-19 virus infection 10/23/2018  . DOE (dyspnea on exertion) 10/23/2018  . Tubulovillous adenoma of colon 01/09/2018  . Excessive ear wax 06/25/2017  . Knee osteoarthritis 06/17/2017  . Anemia in other chronic diseases classified elsewhere 06/17/2017  . Type 2 diabetes mellitus with other specified complication ([place]) 78/93/8101  . Upper airway cough syndrome 05/02/2017  . Obesity, Class III, BMI 40-49.9 (morbid obesity) ([place]) 05/02/2017  . Dyspnea on exertion 05/01/2017  . Hypertension associated with diabetes ([place]) 04/06/2017  . Hyperlipidemia associated with type 2 diabetes mellitus ([place]) 04/06/2017  . Urticaria 11/17/2015  . Angioedema 11/17/2015  . Pain in joint, lower leg 12/10/2013   Past Medical History:  Diagnosis Date  . Anemia   . Arthritis   . Diabetes mellitus   . GERD (gastroesophageal reflux disease)   . Heart murmur   . Hyperlipidemia   . Hypertension   . Neuropathy   . Sarcoidosis     Family History  Problem Relation Age of Onset  . Asthma Brother   . Cancer Mother  stomach  . Cancer Father        prostate  . Cancer Brother        ?  . Diabetes Brother   . Allergic rhinitis Neg Hx   . Angioedema Neg Hx   . Atopy Neg Hx   . Eczema Neg Hx   . Immunodeficiency Neg Hx   . Urticaria Neg Hx   . Colon cancer Neg Hx     Past Surgical History:  Procedure Laterality Date  . BIOPSY  02/08/2018   Procedure: BIOPSY;  Surgeon: Daneil Dolin, MD;  [facility];  Service: Endoscopy;;  . CARPAL TUNNEL RELEASE Left 02/03/2022   Procedure: LEFT CARPAL TUNNEL RELEASE;  Surgeon: Leanora Cover, MD;  Location: [facility];  Service:  Orthopedics;  Laterality: Left;  Bier block  . CESAREAN SECTION    . CHOLECYSTECTOMY    . COLONOSCOPY WITH PROPOFOL N/A 07/06/2017   Dr. Gala Romney: Diverticulosis.five 8 to 16 mm polyps in the ascending colon and in the cecum, removed with hot snare.  Largest polyp could not be removed completely, status post inking.  Pathology revealed cecal tubular adenoma, a sending colon polyps with multiple fragments of tubular adenomas, tubulovillous adenoma, no high-grade dysplasia or malignancy.  Tubular adenomas removed from the rectum as   . COLONOSCOPY WITH PROPOFOL N/A 02/08/2018   12 mm tubular adenoma removed from the ileocecal valve, hyperplastic sigmoid colon polyp removed.  Possible residual polypoid tissue at the ascending colon segment previously tattooed, biopsied (tubular adenoma) and ablated with APC.  Recommend 2-year surveillance study.  . COLONOSCOPY WITH PROPOFOL N/A 07/06/2020   Procedure: COLONOSCOPY WITH PROPOFOL;  Surgeon: Daneil Dolin, MD;  [facility];  Service: Endoscopy;  Laterality: N/A;  am appt, diabetic. general anesthesia; intubation to control respirations. Add extra 15 min - needs to be 1st case per [facility]  . MASTECTOMY, PARTIAL Left    benign  . POLYPECTOMY  07/06/2017   Procedure: POLYPECTOMY;  Surgeon: Daneil Dolin, MD;  [facility];  Service: Endoscopy;;  cecal polyp cs, ascending colon polyp hs  . POLYPECTOMY  02/08/2018   Procedure: POLYPECTOMY;  Surgeon: Daneil Dolin, MD;  [facility];  Service: Endoscopy;;  . POLYPECTOMY  07/06/2020   Procedure: POLYPECTOMY INTESTINAL;  Surgeon: Daneil Dolin, MD;  [facility];  Service: Endoscopy;;  . TUBAL LIGATION     Social History   Occupational History  . Not on file  Tobacco Use  . Smoking status: Never  . Smokeless tobacco: Never  Vaping Use  . Vaping Use: Never used  Substance and Sexual Activity  . Alcohol use: No  . Drug use: No  . Sexual activity: Not  Currently    Birth control/protection: Post-menopausal

## 2022-03-11 DIAGNOSIS — E785 Hyperlipidemia, unspecified: Secondary | ICD-10-CM | POA: Diagnosis not present

## 2022-03-11 DIAGNOSIS — E118 Type 2 diabetes mellitus with unspecified complications: Secondary | ICD-10-CM | POA: Diagnosis not present

## 2022-03-11 DIAGNOSIS — I10 Essential (primary) hypertension: Secondary | ICD-10-CM | POA: Diagnosis not present

## 2022-03-11 DIAGNOSIS — I25118 Atherosclerotic heart disease of native coronary artery with other forms of angina pectoris: Secondary | ICD-10-CM | POA: Diagnosis not present

## 2022-05-03 DIAGNOSIS — M25571 Pain in right ankle and joints of right foot: Secondary | ICD-10-CM | POA: Diagnosis not present

## 2022-05-03 DIAGNOSIS — M79671 Pain in right foot: Secondary | ICD-10-CM | POA: Diagnosis not present

## 2022-05-12 DIAGNOSIS — E118 Type 2 diabetes mellitus with unspecified complications: Secondary | ICD-10-CM | POA: Diagnosis not present

## 2022-05-12 DIAGNOSIS — I10 Essential (primary) hypertension: Secondary | ICD-10-CM | POA: Diagnosis not present

## 2022-05-12 DIAGNOSIS — E785 Hyperlipidemia, unspecified: Secondary | ICD-10-CM | POA: Diagnosis not present

## 2022-05-12 DIAGNOSIS — I25118 Atherosclerotic heart disease of native coronary artery with other forms of angina pectoris: Secondary | ICD-10-CM | POA: Diagnosis not present

## 2022-05-16 DIAGNOSIS — N1832 Chronic kidney disease, stage 3b: Secondary | ICD-10-CM | POA: Diagnosis not present

## 2022-05-16 DIAGNOSIS — E1122 Type 2 diabetes mellitus with diabetic chronic kidney disease: Secondary | ICD-10-CM | POA: Diagnosis not present

## 2022-05-16 DIAGNOSIS — I129 Hypertensive chronic kidney disease with stage 1 through stage 4 chronic kidney disease, or unspecified chronic kidney disease: Secondary | ICD-10-CM | POA: Diagnosis not present

## 2022-05-16 DIAGNOSIS — M170 Bilateral primary osteoarthritis of knee: Secondary | ICD-10-CM | POA: Diagnosis not present

## 2022-05-16 DIAGNOSIS — R809 Proteinuria, unspecified: Secondary | ICD-10-CM | POA: Diagnosis not present

## 2022-05-17 NOTE — Progress Notes (Unsigned)
Cardiology Office Note:    Date:  05/19/2022   ID:  Anele, Pinks 1950-12-14, MRN [id number]  PCP:  Caren Macadam, [place] Providers Cardiologist:  Freada Bergeron, MD {  Referring MD: Caren Macadam, MD    History of Present Illness:    KENECIA KLENKE is a 72 y.o. female with a hx of presumed CAD abnormal nuclear study has never been cath because she was symptomatically stable, hypertension, mild-to-moderate AS, HLD, IDDM who was previously followed by Dr. Bronson Ing who now presents to clinic for follow-up.  Was last seen in clinic 09/2021 where she was stable from a CV standpoint. TTE 09/2021 with LVEF 60-65%, normal RV, trivial MR, mild AS with mean gradient 24mHg, DI 0.53, AVA 1cm2.  Today, the patient overall feels well today. She had carpal tunnel surgery in her left hand and did well. She did, however, fall in the bathtub in 03/2022 and now has numbness/achiness in the left upper arm and shoulder. She has follow-up with her PCP to discuss further.   Otherwise, she is doing well from a CV perspective. No chest pain, SOB, orthopnea or PND. She had LE edema and her amlodipine was lowered to 54mdaily and chlorthalidone 2540maily was started. No lightheadedness, dizziness, or palpitations.   Past Medical History:  Diagnosis Date   Anemia    Arthritis    Diabetes mellitus    GERD (gastroesophageal reflux disease)    Heart murmur    Hyperlipidemia    Hypertension    Neuropathy    Sarcoidosis     Past Surgical History:  Procedure Laterality Date   BIOPSY  02/08/2018   Procedure: BIOPSY;  Surgeon: RouDaneil DolinD;  [facility];  Service: Endoscopy;;   CARPAL TUNNEL RELEASE Left 02/03/2022   Procedure: LEFT CARPAL TUNNEL RELEASE;  Surgeon: KuzLeanora CoverD;  [facility];  Laterality: Left;  Bier block   CESAREAN SECTION     CHOLECYSTECTOMY     COLONOSCOPY WITH PROPOFOL N/A  07/06/2017   Dr. RouGala Romneyiverticulosis.five 8 to 16 mm polyps in the ascending colon and in the cecum, removed with hot snare.  Largest polyp could not be removed completely, status post inking.  Pathology revealed cecal tubular adenoma, a sending colon polyps with multiple fragments of tubular adenomas, tubulovillous adenoma, no high-grade dysplasia or malignancy.  Tubular adenomas removed from the rectum as    COLONOSCOPY WITH PROPOFOL N/A 02/08/2018   12 mm tubular adenoma removed from the ileocecal valve, hyperplastic sigmoid colon polyp removed.  Possible residual polypoid tissue at the ascending colon segment previously tattooed, biopsied (tubular adenoma) and ablated with APC.  Recommend 2-year surveillance study.   COLONOSCOPY WITH PROPOFOL N/A 07/06/2020   Procedure: COLONOSCOPY WITH PROPOFOL;  Surgeon: RouDaneil DolinD;  [facility];  Service: Endoscopy;  Laterality: N/A;  am appt, diabetic. general anesthesia; intubation to control respirations. Add extra 15 min - needs to be 1st case per Melanie   MASTECTOMY, PARTIAL Left    benign   POLYPECTOMY  07/06/2017   Procedure: POLYPECTOMY;  Surgeon: RouDaneil DolinD;  [facility];  Service: Endoscopy;;  cecal polyp cs, ascending colon polyp hs   POLYPECTOMY  02/08/2018   Procedure: POLYPECTOMY;  Surgeon: RouDaneil DolinD;  [facility];  Service: Endoscopy;;   POLYPECTOMY  07/06/2020   Procedure: POLYPECTOMY INTESTINAL;  Surgeon: RouManus Rudd  M, MD;  [facility];  Service: Endoscopy;;   TUBAL LIGATION      Current Medications: Current Meds  Medication Sig   albuterol (VENTOLIN HFA) 108 (90 Base) MCG/ACT inhaler Inhale 2 puffs into the lungs every 6 (six) hours as needed for wheezing or shortness of breath.   amLODipine (NORVASC) 10 MG tablet Take 10 mg by mouth in the morning.   aspirin EC 81 MG tablet Take 81 mg by mouth in the morning. Swallow whole.   atorvastatin (LIPITOR) 20 MG  tablet Take 1 tablet by mouth daily.   B-D UF III MINI PEN NEEDLES 31G X 5 MM MISC USE WITH LANTUS PEN   diclofenac Sodium (VOLTAREN) 1 % GEL Apply 1 application topically 2 (two) times daily as needed.   DULoxetine (CYMBALTA) 60 MG capsule Take 60 mg by mouth at bedtime.   FARXIGA 10 MG TABS tablet Take 10 mg by mouth daily.   gabapentin (NEURONTIN) 300 MG capsule TAKE 2 CAPSULES BY MOUTH THREE TIMES DAILY   guaiFENesin-dextromethorphan (ROBITUSSIN DM) 100-10 MG/5ML syrup Take 10 mLs by mouth every 8 (eight) hours.   hydrochlorothiazide (HYDRODIURIL) 25 MG tablet Take 25 mg by mouth every other day. In the morning   HYDROcodone-acetaminophen (NORCO) 5-325 MG tablet 1-2 tabs po q6 hours prn pain   LANTUS SOLOSTAR 100 UNIT/ML Solostar Pen Inject 50 Units into the skin daily at 10 pm.   losartan (COZAAR) 100 MG tablet TAKE 1 TABLET BY MOUTH ONCE DAILY   metFORMIN (GLUCOPHAGE) 1000 MG tablet TAKE 1 TABLET BY MOUTH TWICE DAILY   metoprolol tartrate (LOPRESSOR) 25 MG tablet TAKE ONE TABLET BY MOUTH TWICE DAILY   naproxen (NAPROSYN) 500 MG tablet Take 1 tablet (500 mg total) by mouth 2 (two) times daily.   nitroGLYCERIN (NITROSTAT) 0.4 MG SL tablet Place 1 tablet (0.4 mg total) under the tongue every 5 (five) minutes as needed.   OZEMPIC, 0.25 OR 0.5 MG/DOSE, 2 MG/1.5ML SOPN Inject 0.5 mg into the skin every Sunday.   potassium chloride SA (KLOR-CON) 20 MEQ tablet Take 1 tablet (20 mEq total) by mouth every other day. In the morning     Allergies:   Erythromycin   Social History   Socioeconomic History   Marital status: Single    Spouse name: Not on file   Number of children: Not on file   Years of education: Not on file   Highest education level: Not on file  Occupational History   Not on file  Tobacco Use   Smoking status: Never   Smokeless tobacco: Never  Vaping Use   Vaping Use: Never used  Substance and Sexual Activity   Alcohol use: No   Drug use: No   Sexual activity: Not  Currently    Birth control/protection: Post-menopausal  Other Topics Concern   Not on file  Social History Narrative   Lives in [place], [place]. 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   Social Determinants of Health   Financial Resource Strain: Low Risk  (10/24/2018)   Overall Financial Resource Strain (CARDIA)    Difficulty of Paying Living Expenses: Not hard at all  Food Insecurity: No Food Insecurity (10/24/2018)   Hunger Vital Sign    Worried About Running Out of Food in the Last Year: Never true    Ran Out of Food in the Last Year: Never true  Transportation Needs: No Transportation Needs (10/24/2018)  PRAPARE - Hydrologist (Medical): No    Lack of Transportation (Non-Medical): No  Physical Activity: Inactive (10/24/2018)   Exercise Vital Sign    Days of Exercise per Week: 0 days    Minutes of Exercise per Session: 0 min  Stress: No Stress Concern Present (10/24/2018)   [place]    Feeling of Stress : Only a little  Social Connections: Moderately Integrated (10/24/2018)   Social Connection and Isolation Panel [NHANES]    Frequency of Communication with Friends and Family: More than three times a week    Frequency of Social Gatherings with Friends and Family: More than three times a week    Attends Religious Services: More than 4 times per year    Active Member of [employer] or Organizations: Yes    Attends Archivist Meetings: 1 to 4 times per year    Marital Status: Divorced     Family History: The patient's family history includes Asthma in her brother; Cancer in her brother, father, and mother; Diabetes in her brother. There is no history of Allergic rhinitis, Angioedema, Atopy, Eczema, Immunodeficiency, Urticaria, or Colon cancer.  ROS:   Please see the history of present illness.     All other systems reviewed and are  negative.  EKGs/Labs/Other Studies Reviewed:    The following studies were reviewed today: TTE 10-20-21: IMPRESSIONS     1. Left ventricular ejection fraction, by estimation, is 60 to 65%. The  left ventricle has normal function. The left ventricle has no regional  wall motion abnormalities. There is mild concentric left ventricular  hypertrophy. Left ventricular diastolic  parameters were normal.   2. Right ventricular systolic function is normal. The right ventricular  size is normal.   3. Trivial mitral valve regurgitation.   4. AV leaflets are difficult to see wall. It is thickened, calcified Peak  and mean gradients through the valve are 29 and 16 mm Hg respectively AVA  (VTI) is 1 cm2. Dimensionless index is 0.53 consistent with mild AS.  Compared to echo report from March  2022, mean gradient is relatively unchanged. Aortic valve regurgitation is  not visualized. Aortic valve sclerosis is present, with no evidence of  aortic valve stenosis.   5. The inferior vena cava is normal in size with greater than 50%  respiratory variability, suggesting right atrial pressure of 3 mmHg.  TTE 05/2020: IMPRESSIONS     1. Left ventricular ejection fraction, by estimation, is 60 to 65%. The  left ventricle has normal function. The left ventricle has no regional  wall motion abnormalities.   2. Right ventricular systolic function is normal. The right ventricular  size is normal. Tricuspid regurgitation signal is inadequate for assessing  PA pressure.   3. Fluid collection posterior to left venticle is most consistent with  pleural effusion.   4. The mitral valve is grossly normal. Trivial mitral valve  regurgitation.   5. The aortic valve has an indeterminant number of cusps. There is  moderate calcification of the aortic valve. Aortic valve regurgitation is  not visualized. Mild to moderate aortic valve stenosis. Aortic valve mean  gradient measures 18.5 mmHg. Aortic  valve Vmax  measures 2.94 m/s. Dimentionless index 0.41.   6. The inferior vena cava is normal in size with greater than 50%  respiratory variability, suggesting right atrial pressure of 3 mmHg.   Myoview 04/2017: No diagnostic ST segment changes to indicate ischemia.  Small, mild intensity, mid to apical anteroseptal defect that is fixed, more prominent on rest imaging and suggestive of soft tissue attenuation. Medium, mild intensity, inferoseptal and inferolateral defect that partially reversible in the mid to apical distribution and fixed at the base suggestive of scar with mild to moderate peri-infarct ischemia. This is an intermediate risk study. Nuclear stress EF: 58%.  EKG:  EKG is not ordered today.  Recent Labs: 02/03/2022: BUN 24; Creatinine, Ser 1.12; Potassium 4.9; Sodium 136  Recent Lipid Panel    Component Value Date/Time   CHOL 97 12/21/2020 1210   TRIG 125 12/21/2020 1210   HDL 37 (L) 12/21/2020 1210   CHOLHDL 2.6 12/21/2020 1210   VLDL 25 12/21/2020 1210   LDLCALC 35 12/21/2020 1210   LDLCALC 69 12/08/2017 0711     Risk Assessment/Calculations:           Physical Exam:    VS:  BP 124/70   Pulse 78   Ht 5' 4"$  (1.626 m)   Wt 245 lb 12.8 oz (111.5 kg)   SpO2 98%   BMI 42.19 kg/m     Wt Readings from Last 3 Encounters:  05/19/22 245 lb 12.8 oz (111.5 kg)  02/03/22 248 lb 7.3 oz (112.7 kg)  02/01/22 250 lb (113.4 kg)     GEN:  Well nourished, well developed in no acute distress HEENT: Normal NECK: No JVD; No carotid bruits CARDIAC: RRR, 2/6 harsh systolic murmur RESPIRATORY:  Clear to auscultation without rales, wheezing or rhonchi  ABDOMEN: Soft, non-tender, non-distended MUSCULOSKELETAL:  Right ankle edema, trace left foot edema SKIN: Warm and dry NEUROLOGIC:  Alert and oriented x 3 PSYCHIATRIC:  Normal affect   ASSESSMENT:    1. Coronary artery disease involving native coronary artery of native heart without angina pectoris   2. Essential hypertension    3. Obesity, Class III, BMI 40-49.9 (morbid obesity) ([place])   4. Type 2 diabetes mellitus with other specified complication, without long-term current use of insulin ([place])   5. Moderate aortic stenosis   6. Hyperlipidemia LDL goal <70     PLAN:    In order of problems listed above:  #Presumed CAD with abnormal nuclear stress in 2019: Nuc in 2019 with medium sized inferoseptal and inferolateral defect that was reversible. Managed medically due to no significant symptoms. Continues to deny any anginal symptoms and would prefer to continue with medical management. -Continue ASA 42m daily -Continue lipitor 240mdaily  #Mild-to-Moderate AS: Mean gradient 1670m on 09/2021  -Continue serial monitoring with next 10/2022  #HTN: BP well controlled at home. -Continue metop 3m51mD -Continue amlodipine 5mg 41mly -Continue chlorthalidone 3mg 81my -Continue losartan 100mg d68m  #DMII: -Continue ozempic, farxiga, insulin and metformin -A1C is 5.5  #HLD: -Continue lipitor 20mg da54m-LDL 34 06/11/21 -Plans to have cholesterol check with PCP  #Morbid Obesity: BMI 42.  -Continue ozempic -Lifestyle modifications as below  Exercise recommendations: Goal of exercising for at least 30 minutes a day, at least 5 times per week.  Please exercise to a moderate exertion.  This means that while exercising it is difficult to speak in full sentences, however you are not so short of breath that you feel you must stop, and not so comfortable that you can carry on a full conversation.  Exertion level should be approximately a 5/10, if 10 is the most exertion you can perform.  Diet recommendations: Recommend a heart healthy diet such as the Mediterranean diet.  This diet consists of  plant based foods, healthy fats, lean meats, olive oil.  It suggests limiting the intake of simple carbohydrates such as white breads, pastries, and pastas.  It also limits the amount of red meat, wine, and dairy products  such as cheese that one should consume on a daily basis.       Medication Adjustments/Labs and Tests Ordered: Current medicines are reviewed at length with the patient today.  Concerns regarding medicines are outlined above.  No orders of the defined types were placed in this encounter.  No orders of the defined types were placed in this encounter.   There are no Patient Instructions on file for this visit.   Signed, Freada Bergeron, MD  05/19/2022 9:44 AM    [place]

## 2022-05-19 ENCOUNTER — Encounter: Payer: Self-pay | Admitting: Cardiology

## 2022-05-19 ENCOUNTER — Ambulatory Visit: Payer: 59 | Attending: Cardiology | Admitting: Cardiology

## 2022-05-19 VITALS — BP 124/70 | HR 78 | Ht 64.0 in | Wt 245.8 lb

## 2022-05-19 DIAGNOSIS — I10 Essential (primary) hypertension: Secondary | ICD-10-CM | POA: Diagnosis not present

## 2022-05-19 DIAGNOSIS — E785 Hyperlipidemia, unspecified: Secondary | ICD-10-CM | POA: Diagnosis not present

## 2022-05-19 DIAGNOSIS — E1169 Type 2 diabetes mellitus with other specified complication: Secondary | ICD-10-CM | POA: Diagnosis not present

## 2022-05-19 DIAGNOSIS — I2510 Atherosclerotic heart disease of native coronary artery without angina pectoris: Secondary | ICD-10-CM

## 2022-05-19 DIAGNOSIS — I350 Nonrheumatic aortic (valve) stenosis: Secondary | ICD-10-CM

## 2022-05-19 NOTE — Patient Instructions (Signed)
Medication Instructions:   Your physician recommends that you continue on your current medications as directed. Please refer to the Current Medication list given to you today.  *If you need a refill on your cardiac medications before your next appointment, please call your pharmacy*   Follow-Up: At [facility], you and your health needs are our priority.  As part of our continuing mission to provide you with exceptional heart care, we have created designated Provider Care Teams.  These Care Teams include your primary Cardiologist (physician) and Advanced Practice Providers (APPs -  Physician Assistants and Nurse Practitioners) who all work together to provide you with the care you need, when you need it.  We recommend signing up for the patient portal called "MyChart".  Sign up information is provided on this After Visit Summary.  MyChart is used to connect with patients for Virtual Visits (Telemedicine).  Patients are able to view lab/test results, encounter notes, upcoming appointments, etc.  Non-urgent messages can be sent to your provider as well.   To learn more about what you can do with MyChart, go to [email or web address].    Your next appointment:   6 month(s)  Provider:   Freada Bergeron, MD

## 2022-05-25 ENCOUNTER — Other Ambulatory Visit (HOSPITAL_COMMUNITY): Payer: Self-pay | Admitting: Family Medicine

## 2022-05-25 DIAGNOSIS — E119 Type 2 diabetes mellitus without complications: Secondary | ICD-10-CM | POA: Diagnosis not present

## 2022-05-25 DIAGNOSIS — Z1231 Encounter for screening mammogram for malignant neoplasm of breast: Secondary | ICD-10-CM

## 2022-06-10 DIAGNOSIS — E785 Hyperlipidemia, unspecified: Secondary | ICD-10-CM | POA: Diagnosis not present

## 2022-06-10 DIAGNOSIS — I25118 Atherosclerotic heart disease of native coronary artery with other forms of angina pectoris: Secondary | ICD-10-CM | POA: Diagnosis not present

## 2022-06-10 DIAGNOSIS — I10 Essential (primary) hypertension: Secondary | ICD-10-CM | POA: Diagnosis not present

## 2022-06-10 DIAGNOSIS — E118 Type 2 diabetes mellitus with unspecified complications: Secondary | ICD-10-CM | POA: Diagnosis not present

## 2022-06-27 ENCOUNTER — Ambulatory Visit (HOSPITAL_COMMUNITY)
Admission: RE | Admit: 2022-06-27 | Discharge: 2022-06-27 | Disposition: A | Discharge status: Home / Self Care | Payer: 59 | Source: Ambulatory Visit | Attending: Family Medicine | Admitting: Family Medicine

## 2022-06-27 DIAGNOSIS — Z1231 Encounter for screening mammogram for malignant neoplasm of breast: Secondary | ICD-10-CM | POA: Diagnosis not present

## 2022-06-28 ENCOUNTER — Encounter (HOSPITAL_COMMUNITY): Payer: Self-pay | Admitting: Family Medicine

## 2022-06-29 ENCOUNTER — Other Ambulatory Visit (HOSPITAL_COMMUNITY): Payer: Self-pay | Admitting: Family Medicine

## 2022-06-29 DIAGNOSIS — R928 Other abnormal and inconclusive findings on diagnostic imaging of breast: Secondary | ICD-10-CM

## 2022-06-30 ENCOUNTER — Encounter (HOSPITAL_COMMUNITY): Payer: Self-pay

## 2022-06-30 ENCOUNTER — Ambulatory Visit (HOSPITAL_COMMUNITY)
Admission: RE | Admit: 2022-06-30 | Discharge: 2022-06-30 | Disposition: A | Discharge status: Home / Self Care | Payer: 59 | Source: Ambulatory Visit | Attending: Family Medicine | Admitting: Family Medicine

## 2022-06-30 DIAGNOSIS — R928 Other abnormal and inconclusive findings on diagnostic imaging of breast: Secondary | ICD-10-CM | POA: Insufficient documentation

## 2022-07-05 DIAGNOSIS — E119 Type 2 diabetes mellitus without complications: Secondary | ICD-10-CM | POA: Diagnosis not present

## 2022-07-05 DIAGNOSIS — I25118 Atherosclerotic heart disease of native coronary artery with other forms of angina pectoris: Secondary | ICD-10-CM | POA: Diagnosis not present

## 2022-07-05 DIAGNOSIS — Z8619 Personal history of other infectious and parasitic diseases: Secondary | ICD-10-CM | POA: Diagnosis not present

## 2022-07-05 DIAGNOSIS — E118 Type 2 diabetes mellitus with unspecified complications: Secondary | ICD-10-CM | POA: Diagnosis not present

## 2022-07-05 DIAGNOSIS — I10 Essential (primary) hypertension: Secondary | ICD-10-CM | POA: Diagnosis not present

## 2022-07-05 DIAGNOSIS — Z794 Long term (current) use of insulin: Secondary | ICD-10-CM | POA: Diagnosis not present

## 2022-07-05 DIAGNOSIS — M170 Bilateral primary osteoarthritis of knee: Secondary | ICD-10-CM | POA: Diagnosis not present

## 2022-07-05 DIAGNOSIS — D860 Sarcoidosis of lung: Secondary | ICD-10-CM | POA: Diagnosis not present

## 2022-07-05 DIAGNOSIS — Z0000 Encounter for general adult medical examination without abnormal findings: Secondary | ICD-10-CM | POA: Diagnosis not present

## 2022-07-12 ENCOUNTER — Other Ambulatory Visit: Payer: Self-pay | Admitting: Orthopedic Surgery

## 2022-07-12 ENCOUNTER — Telehealth: Payer: Self-pay | Admitting: Cardiology

## 2022-07-12 NOTE — Telephone Encounter (Signed)
   Pre-operative Risk Assessment    Patient Name: Ann Roberson  DOB: 08/21/1950 MRN: [id number]      Request for Surgical Clearance    Procedure:   Right carpal tunnel release   Date of Surgery:  Clearance 07/28/22                                 Surgeon:  Dr. Laban Emperor Group or Practice Name:  [facility]   Phone number:  [phone] Fax number:  [phone]   Type of Clearance Requested:   - Medical  - Pharmacy:  Hold Aspirin     Type of Anesthesia:   choice    Additional requests/questions:    Alben Spittle   07/12/2022, 2:06 PM

## 2022-07-13 ENCOUNTER — Telehealth: Payer: Self-pay | Admitting: *Deleted

## 2022-07-13 NOTE — Telephone Encounter (Signed)
Pt has been scheduled for tele pre op appt 07/20/22. Med rec and consent are done.     Patient Consent for Virtual Visit        Ann Roberson has provided verbal consent on 07/13/2022 for a virtual visit (video or telephone).   CONSENT FOR VIRTUAL VISIT FOR:  Ann Roberson  By participating in this virtual visit I agree to the following:  I hereby voluntarily request, consent and authorize [facility] and its employed or contracted physicians, physician assistants, nurse practitioners or other licensed health care professionals (the Practitioner), to provide me with telemedicine health care services (the "Services") as deemed necessary by the treating Practitioner. I acknowledge and consent to receive the Services by the Practitioner via telemedicine. I understand that the telemedicine visit will involve communicating with the Practitioner through live audiovisual communication technology and the disclosure of certain medical information by electronic transmission. I acknowledge that I have been given the opportunity to request an in-person assessment or other available alternative prior to the telemedicine visit and am voluntarily participating in the telemedicine visit.  I understand that I have the right to withhold or withdraw my consent to the use of telemedicine in the course of my care at any time, without affecting my right to future care or treatment, and that the Practitioner or I may terminate the telemedicine visit at any time. I understand that I have the right to inspect all information obtained and/or recorded in the course of the telemedicine visit and may receive copies of available information for a reasonable fee.  I understand that some of the potential risks of receiving the Services via telemedicine include:  Delay or interruption in medical evaluation due to technological equipment failure or disruption; Information transmitted may not be sufficient (e.g.  poor resolution of images) to allow for appropriate medical decision making by the Practitioner; and/or  In rare instances, security protocols could fail, causing a breach of personal health information.  Furthermore, I acknowledge that it is my responsibility to provide information about my medical history, conditions and care that is complete and accurate to the best of my ability. I acknowledge that Practitioner's advice, recommendations, and/or decision may be based on factors not within their control, such as incomplete or inaccurate data provided by me or distortions of diagnostic images or specimens that may result from electronic transmissions. I understand that the practice of medicine is not an exact science and that Practitioner makes no warranties or guarantees regarding treatment outcomes. I acknowledge that a copy of this consent can be made available to me via my patient portal [facility] MyChart), or I can request a printed copy by calling the office of [facility].    I understand that my insurance will be billed for this visit.   I have read or had this consent read to me. I understand the contents of this consent, which adequately explains the benefits and risks of the Services being provided via telemedicine.  I have been provided ample opportunity to ask questions regarding this consent and the Services and have had my questions answered to my satisfaction. I give my informed consent for the services to be provided through the use of telemedicine in my medical care

## 2022-07-13 NOTE — Telephone Encounter (Signed)
Pt was advised she can take her ASA on 07/20/22 when she has the tele appt, but then hold ASA until after procedure and will resume once surgeon advises her to resume.

## 2022-07-13 NOTE — Telephone Encounter (Signed)
   Name: SHEKIA KUPER  DOB: 02-10-1951  MRN: [id number]  Primary Cardiologist: Meriam Sprague, MD  Chart reviewed as part of pre-operative protocol coverage. Because of Janye H Grange's past medical history and time since last visit, she will require a follow-up telephone visit in order to better assess preoperative cardiovascular risk.  Pre-op covering staff: - Please schedule appointment and call patient to inform them. If patient already had an upcoming appointment within acceptable timeframe, please add "pre-op clearance" to the appointment notes so provider is aware. - Please contact requesting surgeon's office via preferred method (i.e, phone, fax) to inform them of need for appointment prior to surgery.  Would be okay to hold ASA x 7 days as long as asymptomatic during phone call.   Sharlene Dory, PA-C  07/13/2022, 7:59 AM

## 2022-07-13 NOTE — Telephone Encounter (Signed)
Pt has been scheduled for tele pre op appt 07/20/22. Med rec and consent are done.

## 2022-07-13 NOTE — Telephone Encounter (Signed)
Left message on machine for pt to contact the office.   

## 2022-07-20 ENCOUNTER — Ambulatory Visit: Payer: 59 | Attending: Internal Medicine | Admitting: Nurse Practitioner

## 2022-07-20 DIAGNOSIS — E785 Hyperlipidemia, unspecified: Secondary | ICD-10-CM | POA: Diagnosis not present

## 2022-07-20 DIAGNOSIS — E118 Type 2 diabetes mellitus with unspecified complications: Secondary | ICD-10-CM | POA: Diagnosis not present

## 2022-07-20 DIAGNOSIS — Z01810 Encounter for preprocedural cardiovascular examination: Secondary | ICD-10-CM

## 2022-07-20 DIAGNOSIS — I10 Essential (primary) hypertension: Secondary | ICD-10-CM | POA: Diagnosis not present

## 2022-07-20 DIAGNOSIS — I25118 Atherosclerotic heart disease of native coronary artery with other forms of angina pectoris: Secondary | ICD-10-CM | POA: Diagnosis not present

## 2022-07-20 NOTE — Progress Notes (Signed)
Virtual Visit via Telephone Note   Because of Ann Roberson's co-morbid illnesses, she is at least at moderate risk for complications without adequate follow up.  This format is felt to be most appropriate for this patient at this time.  The patient did not have access to video technology/had technical difficulties with video requiring transitioning to audio format only (telephone).  All issues noted in this document were discussed and addressed.  No physical exam could be performed with this format.  Please refer to the patient's chart for her consent to telehealth for [facility].  Evaluation Performed:  Preoperative cardiovascular risk assessment _____________   Date:  07/20/2022   Patient ID:  Ann Roberson, Ann Roberson 08/16/1950, MRN [id number] Patient Location:  Home Provider location:   Office  Primary Care Provider:  Aliene Beams, MD Primary Cardiologist:  Meriam Sprague, MD  Chief Complaint / Patient Profile   72 y.o. y/o female with a h/o CAD, mild-moderate aortic stenosis, hypertension, hyperlipidemia, type 2 diabetes, and obesity who is pending R carpal tunnel release with [facility] 07[facility]28[facility]22 and presents today for telephonic preoperative cardiovascular risk assessment.  History of Present Illness    Ann Roberson is a 72 y.o. female who presents via audio/video conferencing for a telehealth visit today.  Pt was last seen in cardiology clinic on 05/19/2022 by Dr. Shari Prows.  At that time Ann Roberson was doing well.  The patient is now pending procedure as outlined above. Since her last visit, she has done well from a cardiac standpoint.   She denies chest pain, palpitations, dyspnea, pnd, orthopnea, n, v, dizziness, syncope, edema, weight gain, or early satiety. All other systems reviewed and are otherwise negative except as noted above.   Past Medical History    Past Medical History:  Diagnosis Date   Anemia     Arthritis    Diabetes mellitus    GERD (gastroesophageal reflux disease)    Heart murmur    Hyperlipidemia    Hypertension    Neuropathy    Sarcoidosis    Past Surgical History:  Procedure Laterality Date   BIOPSY  02/08/2018   Procedure: BIOPSY;  Surgeon: Corbin Ade, MD;  [facility];  Service: Endoscopy;;   CARPAL TUNNEL RELEASE Left 02/03/2022   Procedure: LEFT CARPAL TUNNEL RELEASE;  Surgeon: Betha Loa, MD;  Location: [facility];  Service: Orthopedics;  Laterality: Left;  Bier block   CESAREAN SECTION     CHOLECYSTECTOMY     COLONOSCOPY WITH PROPOFOL N/A 07/06/2017   Dr. Jena Gauss: Diverticulosis.five 8 to 16 mm polyps in the ascending colon and in the cecum, removed with hot snare.  Largest polyp could not be removed completely, status post inking.  Pathology revealed cecal tubular adenoma, a sending colon polyps with multiple fragments of tubular adenomas, tubulovillous adenoma, no high-grade dysplasia or malignancy.  Tubular adenomas removed from the rectum as    COLONOSCOPY WITH PROPOFOL N/A 02/08/2018   12 mm tubular adenoma removed from the ileocecal valve, hyperplastic sigmoid colon polyp removed.  Possible residual polypoid tissue at the ascending colon segment previously tattooed, biopsied (tubular adenoma) and ablated with APC.  Recommend 2-year surveillance study.   COLONOSCOPY WITH PROPOFOL N/A 07/06/2020   Procedure: COLONOSCOPY WITH PROPOFOL;  Surgeon: Corbin Ade, MD;  [facility]: [facility];  Service: Endoscopy;  Laterality: N/A;  am appt, diabetic. general anesthesia; intubation to control respirations. Add extra 15  min - needs to be 1st case per Melanie   MASTECTOMY, PARTIAL Left    benign   POLYPECTOMY  07/06/2017   Procedure: POLYPECTOMY;  Surgeon: Corbin Ade, MD;  [facility];  Service: Endoscopy;;  cecal polyp cs, ascending colon polyp hs   POLYPECTOMY  02/08/2018   Procedure: POLYPECTOMY;  Surgeon: Corbin Ade, MD;  [facility];  Service: Endoscopy;;   POLYPECTOMY  07/06/2020   Procedure: POLYPECTOMY INTESTINAL;  Surgeon: Corbin Ade, MD;  [facility];  Service: Endoscopy;;   TUBAL LIGATION      Allergies  Allergies  Allergen Reactions   Erythromycin Diarrhea and Nausea And Vomiting    Home Medications    Prior to Admission medications   Medication Sig Start Date End Date Taking? Authorizing Provider  albuterol (VENTOLIN HFA) 108 (90 Base) MCG/ACT inhaler Inhale 2 puffs into the lungs every 6 (six) hours as needed for wheezing or shortness of breath. 07/13/20   Vassie Loll, MD  amLODipine (NORVASC) 10 MG tablet Take 10 mg by mouth in the morning.    [provider]  aspirin EC 81 MG tablet Take 81 mg by mouth in the morning. Swallow whole.    [provider]  atorvastatin (LIPITOR) 20 MG tablet Take 1 tablet by mouth daily. 06/19/20   [provider]  B-D UF III MINI PEN NEEDLES 31G X 5 MM MISC USE WITH LANTUS PEN 09/26/17   Aliene Beams, MD  chlorthalidone (HYGROTON) 25 MG tablet Take 25 mg by mouth every morning. 06/16/22   [provider]  diclofenac Sodium (VOLTAREN) 1 % GEL Apply 1 application topically 2 (two) times daily as needed. 04/02/20   [provider]  DULoxetine (CYMBALTA) 60 MG capsule Take 60 mg by mouth at bedtime. 10/05/18   [provider]  FARXIGA 10 MG TABS tablet Take 10 mg by mouth daily. 06/30/20   [provider]  gabapentin (NEURONTIN) 300 MG capsule TAKE 2 CAPSULES BY MOUTH THREE TIMES DAILY 11/24/17   Aliene Beams, MD  guaiFENesin-dextromethorphan (ROBITUSSIN DM) 100-10 MG/5ML syrup Take 10 mLs by mouth every 8 (eight) hours. 07/13/20   Vassie Loll, MD  hydrochlorothiazide (HYDRODIURIL) 25 MG tablet Take 25 mg by mouth every other day. In the morning 07/27/16   [provider]  HYDROcodone-acetaminophen [facility]) 5-325 MG tablet 1-2 tabs po q6 hours prn pain 02/03/22    Betha Loa, MD  LANTUS SOLOSTAR 100 UNIT/ML Solostar Pen Inject 50 Units into the skin daily at 10 pm. 12/31/19   [provider]  losartan (COZAAR) 100 MG tablet TAKE 1 TABLET BY MOUTH ONCE DAILY 12/11/17   Aliene Beams, MD  metFORMIN (GLUCOPHAGE) 1000 MG tablet TAKE 1 TABLET BY MOUTH TWICE DAILY 12/11/17   Aliene Beams, MD  metoprolol tartrate (LOPRESSOR) 25 MG tablet TAKE ONE TABLET BY MOUTH TWICE DAILY 01/10/22   Meriam Sprague, MD  naproxen (NAPROSYN) 500 MG tablet Take 1 tablet (500 mg total) by mouth 2 (two) times daily. 03/05/21   Sabas Sous, MD  nitroGLYCERIN (NITROSTAT) 0.4 MG SL tablet Place 1 tablet (0.4 mg total) under the tongue every 5 (five) minutes as needed. 08/29/17   Laqueta Linden, MD  OZEMPIC, 0.25 OR 0.5 MG/DOSE, 2 MG/1.5ML SOPN Inject 0.5 mg into the skin every Sunday. 10/21/19   [provider]  potassium chloride SA (KLOR-CON) 20 MEQ tablet Take 1 tablet (20 mEq total) by mouth every other day.  In the morning Patient taking differently: Take 10 mEq by mouth daily. PER THE PT 07/13/22 07/13/20   Vassie Loll, MD    Physical Exam    Vital Signs:  Ann Roberson does not have vital signs available for review today.  Given telephonic nature of communication, physical exam is limited. AAOx3. NAD. Normal affect.  Speech and respirations are unlabored.  Accessory Clinical Findings    None  Assessment & Plan    1.  Preoperative Cardiovascular Risk Assessment:  According to the Revised Cardiac Risk Index (RCRI), her Perioperative Risk of Major Cardiac Event is (%): 0.9. Her Functional Capacity in METs is: 5.07 according to the Duke Activity Status Index (DASI). Therefore, based on ACC/AHA guidelines, patient would be at acceptable risk for the planned procedure without further cardiovascular testing.  The patient was advised that if she develops new symptoms prior to surgery to contact our office to arrange for a follow-up visit, and  she verbalized understanding.  Per office protocol, she may hold Aspirin for 5-7 days prior to procedure. Please resume Aspirin as soon as possible postprocedure, at the discretion of the surgeon.    A copy of this note will be routed to requesting surgeon.  Time:   Today, I have spent 5 minutes with the patient with telehealth technology discussing medical history, symptoms, and management plan.     Joylene Grapes, NP  07/20/2022, 2:29 PM

## 2022-07-21 ENCOUNTER — Other Ambulatory Visit: Payer: Self-pay

## 2022-07-21 ENCOUNTER — Encounter (HOSPITAL_BASED_OUTPATIENT_CLINIC_OR_DEPARTMENT_OTHER): Payer: Self-pay | Admitting: Orthopedic Surgery

## 2022-07-21 NOTE — Progress Notes (Signed)
   07/21/22 1336  Pre-op Phone Call  Surgery Date Verified 07/28/22  Arrival Time Verified 0715  Surgery Location Verified [facility]  Medical History Reviewed Yes  Is the patient taking a GLP-1 receptor agonist? Yes  Has the patient been informed on holding medication? Yes  Does the patient have diabetes? Type II  Does the patient use a Continuous Blood Glucose Monitor? No  Is the patient on an insulin pump? No  Has the diabetes coordinator been notified? No  Do you have a history of heart problems? Yes (HTN, Murmur)  Antiarrhythmic device type  (NA)  Does patient have other implanted devices? No  Patient Teaching Enhanced Recovery;Pre / Post Procedure  Patient educated about smoking cessation 24 hours prior to surgery. N/A Non-Smoker  Patient verbalizes understanding of bowel prep? N/A  Med Rec Completed Yes  Take the Following Meds the Morning of Surgery metoprolol, amolodipine with sip water DOS; hold ozempic and ASA x5-7d; hold lantus night before  Recent  Lab Work, EKG, CXR? Yes  NPO (Including gum & candy) After midnight  Patient instructed to stop clear liquids including Carb loading drink at: 0515  Stop Solids, Milk, Candy, and Gum STARTING AT MIDNIGHT  Responsible adult to drive and be with you for 24 hours? Yes  Name & Phone Number for Ride/Caregiver son, Chrissie Noa  No Burton Apley, money, nail polish or make-up.  No lotions, powders, perfumes. No shaving  48 hrs. prior to surgery. Yes  Contacts, Dentures & Glasses Will Have to be Removed Before OR. Yes  Please bring your ID and Insurance Card the morning of your surgery. (Surgery Centers Only) Yes  Bring any papers or x-rays with you that your surgeon gave you. Yes  Instructed to contact the location of procedure/ provider if they or anyone in their household develops symptoms or tests positive for COVID-19, has close contact with someone who tests positive for COVID, or has known exposure to any contagious illness. Yes  Call this  number the morning of surgery  with any problems that may cancel your surgery. [phone]  Covid-19 Assessment  Have you had a positive COVID-19 test within the previous 90 days? No  COVID Testing Guidance Proceed with the additional questions.  Patient's surgery required a COVID-19 test (cardiothoracic, complex ENT, and bronchoscopies/ EBUS) No  Have you been unmasked and in close contact with anyone with COVID-19 or COVID-19 symptoms within the past 10 days? No  Do you or anyone in your household currently have any COVID-19 symptoms? No

## 2022-07-22 ENCOUNTER — Encounter (HOSPITAL_BASED_OUTPATIENT_CLINIC_OR_DEPARTMENT_OTHER)
Admission: RE | Admit: 2022-07-22 | Discharge: 2022-07-22 | Disposition: A | Discharge status: Home / Self Care | Payer: 59 | Source: Ambulatory Visit | Attending: Orthopedic Surgery | Admitting: Orthopedic Surgery

## 2022-07-22 DIAGNOSIS — I10 Essential (primary) hypertension: Secondary | ICD-10-CM | POA: Diagnosis not present

## 2022-07-22 DIAGNOSIS — Z7985 Long-term (current) use of injectable non-insulin antidiabetic drugs: Secondary | ICD-10-CM | POA: Diagnosis not present

## 2022-07-22 DIAGNOSIS — E1140 Type 2 diabetes mellitus with diabetic neuropathy, unspecified: Secondary | ICD-10-CM | POA: Diagnosis not present

## 2022-07-22 DIAGNOSIS — G5601 Carpal tunnel syndrome, right upper limb: Secondary | ICD-10-CM | POA: Diagnosis not present

## 2022-07-22 DIAGNOSIS — Z6841 Body Mass Index (BMI) 40.0 and over, adult: Secondary | ICD-10-CM | POA: Diagnosis not present

## 2022-07-22 DIAGNOSIS — Z794 Long term (current) use of insulin: Secondary | ICD-10-CM | POA: Diagnosis not present

## 2022-07-22 DIAGNOSIS — Z7984 Long term (current) use of oral hypoglycemic drugs: Secondary | ICD-10-CM | POA: Diagnosis not present

## 2022-07-22 DIAGNOSIS — K219 Gastro-esophageal reflux disease without esophagitis: Secondary | ICD-10-CM | POA: Diagnosis not present

## 2022-07-22 DIAGNOSIS — Z79899 Other long term (current) drug therapy: Secondary | ICD-10-CM | POA: Diagnosis not present

## 2022-07-22 LAB — BASIC METABOLIC PANEL
Anion gap: 6 (ref 5–15)
BUN: 29 mg/dL — ABNORMAL HIGH (ref 8–23)
CO2: 20 mmol/L — ABNORMAL LOW (ref 22–32)
Calcium: 9.1 mg/dL (ref 8.9–10.3)
Chloride: 110 mmol/L (ref 98–111)
Creatinine, Ser: 1.5 mg/dL — ABNORMAL HIGH (ref 0.44–1.00)
GFR, Estimated: 37 mL/min — ABNORMAL LOW (ref >60)
Glucose, Bld: 115 mg/dL — ABNORMAL HIGH (ref 70–99)
Potassium: 6.3 mmol/L (ref 3.5–5.1)
Sodium: 136 mmol/L (ref 135–145)

## 2022-07-22 NOTE — Progress Notes (Signed)
K+ 6.3, Dr. Krista Blue aware, will recheck BMET day of surgery.

## 2022-07-22 NOTE — Progress Notes (Signed)

## 2022-07-28 ENCOUNTER — Ambulatory Visit (HOSPITAL_BASED_OUTPATIENT_CLINIC_OR_DEPARTMENT_OTHER): Payer: 59 | Admitting: Anesthesiology

## 2022-07-28 ENCOUNTER — Ambulatory Visit (HOSPITAL_BASED_OUTPATIENT_CLINIC_OR_DEPARTMENT_OTHER)
Admission: RE | Admit: 2022-07-28 | Discharge: 2022-07-28 | Disposition: A | Discharge status: Home / Self Care | Payer: 59 | Attending: Orthopedic Surgery | Admitting: Orthopedic Surgery

## 2022-07-28 ENCOUNTER — Other Ambulatory Visit: Payer: Self-pay

## 2022-07-28 ENCOUNTER — Encounter (HOSPITAL_BASED_OUTPATIENT_CLINIC_OR_DEPARTMENT_OTHER)
Admission: RE | Disposition: A | Discharge status: Home / Self Care | Payer: Self-pay | Source: Home / Self Care | Attending: Orthopedic Surgery

## 2022-07-28 ENCOUNTER — Encounter (HOSPITAL_BASED_OUTPATIENT_CLINIC_OR_DEPARTMENT_OTHER): Payer: Self-pay | Admitting: Orthopedic Surgery

## 2022-07-28 DIAGNOSIS — Z794 Long term (current) use of insulin: Secondary | ICD-10-CM

## 2022-07-28 DIAGNOSIS — E1140 Type 2 diabetes mellitus with diabetic neuropathy, unspecified: Secondary | ICD-10-CM | POA: Insufficient documentation

## 2022-07-28 DIAGNOSIS — G5601 Carpal tunnel syndrome, right upper limb: Secondary | ICD-10-CM | POA: Diagnosis not present

## 2022-07-28 DIAGNOSIS — Z6841 Body Mass Index (BMI) 40.0 and over, adult: Secondary | ICD-10-CM | POA: Insufficient documentation

## 2022-07-28 DIAGNOSIS — K219 Gastro-esophageal reflux disease without esophagitis: Secondary | ICD-10-CM | POA: Insufficient documentation

## 2022-07-28 DIAGNOSIS — I10 Essential (primary) hypertension: Secondary | ICD-10-CM | POA: Diagnosis not present

## 2022-07-28 DIAGNOSIS — Z7985 Long-term (current) use of injectable non-insulin antidiabetic drugs: Secondary | ICD-10-CM | POA: Diagnosis not present

## 2022-07-28 DIAGNOSIS — Z79899 Other long term (current) drug therapy: Secondary | ICD-10-CM | POA: Insufficient documentation

## 2022-07-28 DIAGNOSIS — Z419 Encounter for procedure for purposes other than remedying health state, unspecified: Secondary | ICD-10-CM

## 2022-07-28 DIAGNOSIS — E119 Type 2 diabetes mellitus without complications: Secondary | ICD-10-CM

## 2022-07-28 DIAGNOSIS — Z7984 Long term (current) use of oral hypoglycemic drugs: Secondary | ICD-10-CM | POA: Diagnosis not present

## 2022-07-28 DIAGNOSIS — D649 Anemia, unspecified: Secondary | ICD-10-CM | POA: Diagnosis not present

## 2022-07-28 HISTORY — PX: CARPAL TUNNEL RELEASE: SHX101

## 2022-07-28 LAB — BASIC METABOLIC PANEL
Anion gap: 8 (ref 5–15)
BUN: 29 mg/dL — ABNORMAL HIGH (ref 8–23)
CO2: 19 mmol/L — ABNORMAL LOW (ref 22–32)
Calcium: 9 mg/dL (ref 8.9–10.3)
Chloride: 111 mmol/L (ref 98–111)
Creatinine, Ser: 1.35 mg/dL — ABNORMAL HIGH (ref 0.44–1.00)
GFR, Estimated: 43 mL/min — ABNORMAL LOW (ref >60)
Glucose, Bld: 109 mg/dL — ABNORMAL HIGH (ref 70–99)
Potassium: 4.7 mmol/L (ref 3.5–5.1)
Sodium: 138 mmol/L (ref 135–145)

## 2022-07-28 LAB — GLUCOSE, CAPILLARY
Glucose-Capillary: 111 mg/dL — ABNORMAL HIGH (ref 70–99)
Glucose-Capillary: 93 mg/dL (ref 70–99)

## 2022-07-28 SURGERY — CARPAL TUNNEL RELEASE
Anesthesia: General | Site: Wrist | Laterality: Right

## 2022-07-28 MED ORDER — FENTANYL CITRATE (PF) 100 MCG/2ML IJ SOLN: 25.0000 ug | INTRAMUSCULAR | Status: DC | PRN

## 2022-07-28 MED ORDER — ACETAMINOPHEN 500 MG PO TABS
1000.0000 mg | ORAL_TABLET | Freq: Once | ORAL | Status: AC
  Administered 2022-07-28: 1000 mg via ORAL

## 2022-07-28 MED ORDER — CEFAZOLIN SODIUM-DEXTROSE 2-4 GM/100ML-% IV SOLN
2.0000 g | INTRAVENOUS | Status: AC
  Administered 2022-07-28: 2 g via INTRAVENOUS

## 2022-07-28 MED ORDER — HYDROCODONE-ACETAMINOPHEN 5-325 MG PO TABS: ORAL_TABLET | ORAL | 0 refills | Status: AC

## 2022-07-28 MED ORDER — PHENYLEPHRINE HCL (PRESSORS) 10 MG/ML IV SOLN
INTRAVENOUS | Status: DC | PRN
  Administered 2022-07-28: 160 ug via INTRAVENOUS
  Administered 2022-07-28: 80 ug via INTRAVENOUS

## 2022-07-28 MED ORDER — MIDAZOLAM HCL 5 MG/5ML IJ SOLN
INTRAMUSCULAR | Status: DC | PRN
  Administered 2022-07-28: 2 mg via INTRAVENOUS

## 2022-07-28 MED ORDER — BUPIVACAINE HCL (PF) 0.25 % IJ SOLN
INTRAMUSCULAR | Status: DC | PRN
  Administered 2022-07-28: 9 mL

## 2022-07-28 MED ORDER — ACETAMINOPHEN 500 MG PO TABS
ORAL_TABLET | ORAL | Status: AC
  Filled 2022-07-28: qty 2

## 2022-07-28 MED ORDER — FENTANYL CITRATE (PF) 100 MCG/2ML IJ SOLN
INTRAMUSCULAR | Status: AC
  Filled 2022-07-28: qty 2

## 2022-07-28 MED ORDER — CEFAZOLIN SODIUM-DEXTROSE 2-4 GM/100ML-% IV SOLN
INTRAVENOUS | Status: AC
  Filled 2022-07-28: qty 100

## 2022-07-28 MED ORDER — MIDAZOLAM HCL 2 MG/2ML IJ SOLN
INTRAMUSCULAR | Status: AC
  Filled 2022-07-28: qty 2

## 2022-07-28 MED ORDER — LIDOCAINE HCL (CARDIAC) PF 100 MG/5ML IV SOSY
PREFILLED_SYRINGE | INTRAVENOUS | Status: DC | PRN
  Administered 2022-07-28: 50 mg via INTRAVENOUS

## 2022-07-28 MED ORDER — LACTATED RINGERS IV SOLN: INTRAVENOUS | Status: DC

## 2022-07-28 MED ORDER — FENTANYL CITRATE (PF) 100 MCG/2ML IJ SOLN
INTRAMUSCULAR | Status: DC | PRN
  Administered 2022-07-28 (×2): 50 ug via INTRAVENOUS

## 2022-07-28 MED ORDER — DEXAMETHASONE SODIUM PHOSPHATE 4 MG/ML IJ SOLN
INTRAMUSCULAR | Status: DC | PRN
  Administered 2022-07-28: 5 mg via INTRAVENOUS

## 2022-07-28 MED ORDER — DEXAMETHASONE SODIUM PHOSPHATE 10 MG/ML IJ SOLN
INTRAMUSCULAR | Status: AC
  Filled 2022-07-28: qty 1

## 2022-07-28 MED ORDER — PROPOFOL 500 MG/50ML IV EMUL
INTRAVENOUS | Status: DC | PRN
  Administered 2022-07-28: 150 ug/kg/min via INTRAVENOUS

## 2022-07-28 MED ORDER — PROPOFOL 10 MG/ML IV BOLUS
INTRAVENOUS | Status: AC
  Filled 2022-07-28: qty 20

## 2022-07-28 MED ORDER — BUPIVACAINE HCL (PF) 0.25 % IJ SOLN
INTRAMUSCULAR | Status: AC
  Filled 2022-07-28: qty 60

## 2022-07-28 MED ORDER — ONDANSETRON HCL 4 MG/2ML IJ SOLN
INTRAMUSCULAR | Status: AC
  Filled 2022-07-28: qty 2

## 2022-07-28 MED ORDER — LIDOCAINE 2% (20 MG/ML) 5 ML SYRINGE
INTRAMUSCULAR | Status: AC
  Filled 2022-07-28: qty 5

## 2022-07-28 MED ORDER — PROPOFOL 10 MG/ML IV BOLUS
INTRAVENOUS | Status: DC | PRN
  Administered 2022-07-28: 50 mg via INTRAVENOUS
  Administered 2022-07-28: 40 mg via INTRAVENOUS
  Administered 2022-07-28: 110 mg via INTRAVENOUS

## 2022-07-28 MED ORDER — ONDANSETRON HCL 4 MG/2ML IJ SOLN
INTRAMUSCULAR | Status: DC | PRN
  Administered 2022-07-28: 4 mg via INTRAVENOUS

## 2022-07-28 SURGICAL SUPPLY — 35 items
APL PRP STRL LF DISP 70% ISPRP (MISCELLANEOUS) ×1
BLADE SURG 15 STRL LF DISP TIS (BLADE) ×2 IMPLANT
BLADE SURG 15 STRL SS (BLADE) ×2
BNDG CMPR 5X3 KNIT ELC UNQ LF (GAUZE/BANDAGES/DRESSINGS) ×1
BNDG CMPR 9X4 STRL LF SNTH (GAUZE/BANDAGES/DRESSINGS)
BNDG ELASTIC 3INX 5YD STR LF (GAUZE/BANDAGES/DRESSINGS) ×1 IMPLANT
BNDG ESMARK 4X9 LF (GAUZE/BANDAGES/DRESSINGS) IMPLANT
BNDG GAUZE DERMACEA FLUFF 4 (GAUZE/BANDAGES/DRESSINGS) ×1 IMPLANT
BNDG GZE DERMACEA 4 6PLY (GAUZE/BANDAGES/DRESSINGS) ×1
CHLORAPREP W/TINT 26 (MISCELLANEOUS) ×1 IMPLANT
CORD BIPOLAR FORCEPS 12FT (ELECTRODE) ×1 IMPLANT
COVER BACK TABLE 60X90IN (DRAPES) ×1 IMPLANT
COVER MAYO STAND STRL (DRAPES) ×1 IMPLANT
CUFF TOURN SGL QUICK 18X4 (TOURNIQUET CUFF) ×1 IMPLANT
DRAPE EXTREMITY T 121X128X90 (DISPOSABLE) ×1 IMPLANT
DRAPE SURG 17X23 STRL (DRAPES) ×1 IMPLANT
GAUZE PAD ABD 8X10 STRL (GAUZE/BANDAGES/DRESSINGS) ×1 IMPLANT
GAUZE SPONGE 4X4 12PLY STRL (GAUZE/BANDAGES/DRESSINGS) ×1 IMPLANT
GAUZE XEROFORM 1X8 LF (GAUZE/BANDAGES/DRESSINGS) ×1 IMPLANT
GLOVE BIO SURGEON STRL SZ7.5 (GLOVE) ×1 IMPLANT
GLOVE BIOGEL PI IND STRL 8 (GLOVE) ×1 IMPLANT
GOWN STRL REUS W/ TWL LRG LVL3 (GOWN DISPOSABLE) ×1 IMPLANT
GOWN STRL REUS W/TWL LRG LVL3 (GOWN DISPOSABLE) ×1
GOWN STRL REUS W/TWL XL LVL3 (GOWN DISPOSABLE) ×1 IMPLANT
NDL HYPO 25X1 1.5 SAFETY (NEEDLE) ×1 IMPLANT
NEEDLE HYPO 25X1 1.5 SAFETY (NEEDLE) ×1 IMPLANT
NS IRRIG 1000ML POUR BTL (IV SOLUTION) ×1 IMPLANT
PACK BASIN DAY SURGERY FS (CUSTOM PROCEDURE TRAY) ×1 IMPLANT
PADDING CAST ABS COTTON 4X4 ST (CAST SUPPLIES) ×1 IMPLANT
STOCKINETTE 4X48 STRL (DRAPES) ×1 IMPLANT
SUT ETHILON 4 0 PS 2 18 (SUTURE) ×1 IMPLANT
SYR BULB EAR ULCER 3OZ GRN STR (SYRINGE) ×1 IMPLANT
SYR CONTROL 10ML LL (SYRINGE) ×1 IMPLANT
TOWEL GREEN STERILE FF (TOWEL DISPOSABLE) ×2 IMPLANT
UNDERPAD 30X36 HEAVY ABSORB (UNDERPADS AND DIAPERS) ×1 IMPLANT

## 2022-07-28 NOTE — Transfer of Care (Signed)
Immediate Anesthesia Transfer of Care Note  Patient: CINA KLUMPP  Procedure(s) Performed: RIGHT CARPAL TUNNEL RELEASE (Right: Wrist)  Patient Location: PACU  Anesthesia Type:General  Level of Consciousness: awake, alert , and oriented  Airway & Oxygen Therapy: Patient Spontanous Breathing and Patient connected to face mask oxygen  Post-op Assessment: Report given to RN and Post -op Vital signs reviewed and stable  Post vital signs: Reviewed and stable  Last Vitals:  Vitals Value Taken Time  BP 103/63 07/28/22 1030  Temp 36.2 C 07/28/22 1011  Pulse 86 07/28/22 1043  Resp 13 07/28/22 1043  SpO2 92 % 07/28/22 1043  Vitals shown include unvalidated device data.  Last Pain:  Vitals:   07/28/22 1030  TempSrc:   PainSc: 0-No pain      Patients Stated Pain Goal: 0 (07/28/22 0721)  Complications: No notable events documented.

## 2022-07-28 NOTE — Op Note (Signed)
07/28/2022 [facility]                              OPERATIVE REPORT   PREOPERATIVE DIAGNOSIS:  Right carpal tunnel syndrome.  POSTOPERATIVE DIAGNOSIS:  Right carpal tunnel syndrome.  PROCEDURE:  Right carpal tunnel release.  SURGEON:  Betha Loa, MD  ASSISTANT:  none.  ANESTHESIA: General  IV FLUIDS:  Per anesthesia flow sheet.  ESTIMATED BLOOD LOSS:  Minimal.  COMPLICATIONS:  None.  SPECIMENS:  None.  TOURNIQUET TIME:    Total Tourniquet Time Documented: Upper Arm (Right) - 18 minutes Total: Upper Arm (Right) - 18 minutes   DISPOSITION:  Stable to PACU.  LOCATION: [facility]  INDICATIONS:  72 y.o. yo female with numbness and tingling right hand.  Nocturnal symptoms. Positive nerve conduction studies. She wishes to proceed with right carpal tunnel release.  Risks, benefits and alternatives of surgery were discussed including the risk of blood loss; infection; damage to nerves, vessels, tendons, ligaments, bone; failure of surgery; need for additional surgery; complications with wound healing; continued pain; recurrence of carpal tunnel syndrome; and damage to motor branch. She voiced understanding of these risks and elected to proceed.   OPERATIVE COURSE:  After being identified preoperatively by myself, the patient and I agreed upon the procedure and site of procedure.  The surgical site was marked.  Surgical consent had been signed.  She was given IV Ancef as preoperative antibiotic prophylaxis.  She was transferred to the operating room and placed on the operating room table in supine position with the Right upper extremity on an armboard.  General anesthesia was induced by the anesthesiologist.  Right upper extremity was prepped and draped in normal sterile orthopaedic fashion.  A surgical pause was performed between the surgeons, anesthesia, and operating room staff, and all were in agreement as to the patient, procedure, and site of procedure.   Tourniquet at the proximal aspect of the extremity was inflated to 250 mmHg after exsanguination of the arm with an Esmarch bandage  Incision was made over the transverse carpal ligament and carried into the subcutaneous tissues by spreading technique.  Bipolar electrocautery was used to obtain hemostasis.  The palmar fascia was sharply incised.  The transverse carpal ligament was identified.  The fascia distal to the ligament was opened.  Retractor was placed and the flexor tendons were identified.  The flexor tendon to the ring finger was identified and retracted radially.  The transverse carpal ligament was then incised from distal to proximal under direct visualization.  Scissors were used to split the distal aspect of the volar antebrachial fascia.  A finger was placed into the wound to ensure complete decompression, which was the case.  The nerve was examined.  There was an hourglass deformity.  The motor branch was identified and was intact.  The wound was copiously irrigated with sterile saline.  It was then closed with 4-0 nylon in a horizontal mattress fashion.  It was injected with 0.25% plain Marcaine to aid in postoperative analgesia.  It was dressed with sterile Xeroform, 4x4s, an ABD, and wrapped with Kerlix and an Ace bandage.  Tourniquet was deflated at 18 minutes.  Fingertips were pink with brisk capillary refill after deflation of the tourniquet.  Operative drapes were broken down.  The patient was awoken from anesthesia safely.  She was transferred back to stretcher and taken to the PACU in stable condition.  I will see her back in the office in 1 week for postoperative followup.  I will give her a prescription for Norco 5/325 1-2 tabs PO q6 hours prn pain, dispense # 15.    Betha Loa, MD Electronically signed, 07/28/22

## 2022-07-28 NOTE — Discharge Instructions (Addendum)

## 2022-07-28 NOTE — Anesthesia Postprocedure Evaluation (Signed)
Anesthesia Post Note  Patient: Ann Roberson  Procedure(s) Performed: RIGHT CARPAL TUNNEL RELEASE (Right: Wrist)     Patient location during evaluation: PACU Anesthesia Type: General Level of consciousness: awake and alert Pain management: pain level controlled Vital Signs Assessment: post-procedure vital signs reviewed and stable Respiratory status: spontaneous breathing, nonlabored ventilation and respiratory function stable Cardiovascular status: blood pressure returned to baseline and stable Postop Assessment: no apparent nausea or vomiting Anesthetic complications: no  No notable events documented.  Last Vitals:  Vitals:   07/28/22 1109 07/28/22 1122  BP: (!) 86/60 127/64  Pulse: 87   Resp: 16   Temp: (!) 36.2 C   SpO2: 93% 94%    Last Pain:  Vitals:   07/28/22 1109  TempSrc:   PainSc: 0-No pain                 Francisco Eyerly,W. EDMOND

## 2022-07-28 NOTE — Anesthesia Procedure Notes (Signed)
Procedure Name: LMA Insertion Date/Time: 07/28/2022 9:34 AM  Performed by: Cleda Clarks, CRNAPre-anesthesia Checklist: Patient identified, Emergency Drugs available, Suction available and Patient being monitored Patient Re-evaluated:Patient Re-evaluated prior to induction Oxygen Delivery Method: Circle system utilized Preoxygenation: Pre-oxygenation with 100% oxygen Induction Type: IV induction Ventilation: Mask ventilation without difficulty LMA: LMA inserted LMA Size: 4.0 Number of attempts: 1 Placement Confirmation: positive ETCO2 Tube secured with: Tape Dental Injury: Teeth and Oropharynx as per pre-operative assessment

## 2022-07-28 NOTE — H&P (Signed)
Ann Roberson is an 72 y.o. female.   Chief Complaint: carpal tunnel syndrome HPI: 72 y.o. yo female with numbness and tingling right hand.  Nocturnal symptoms. Positive nerve conduction studies. She wishes to have right carpal tunnel release.   Allergies:  Allergies  Allergen Reactions   Erythromycin Diarrhea and Nausea And Vomiting    Past Medical History:  Diagnosis Date   Anemia    Arthritis    Diabetes mellitus    GERD (gastroesophageal reflux disease)    Heart murmur    Hyperlipidemia    Hypertension    Neuropathy    Sarcoidosis     Past Surgical History:  Procedure Laterality Date   BIOPSY  02/08/2018   Procedure: BIOPSY;  Surgeon: Corbin Ade, MD;  [facility];  Service: Endoscopy;;   CARPAL TUNNEL RELEASE Left 02/03/2022   Procedure: LEFT CARPAL TUNNEL RELEASE;  Surgeon: Betha Loa, MD;  Location: [facility];  Service: Orthopedics;  Laterality: Left;  Bier block   CESAREAN SECTION     CHOLECYSTECTOMY     COLONOSCOPY WITH PROPOFOL N/A 07/06/2017   Dr. Jena Gauss: Diverticulosis.five 8 to 16 mm polyps in the ascending colon and in the cecum, removed with hot snare.  Largest polyp could not be removed completely, status post inking.  Pathology revealed cecal tubular adenoma, a sending colon polyps with multiple fragments of tubular adenomas, tubulovillous adenoma, no high-grade dysplasia or malignancy.  Tubular adenomas removed from the rectum as    COLONOSCOPY WITH PROPOFOL N/A 02/08/2018   12 mm tubular adenoma removed from the ileocecal valve, hyperplastic sigmoid colon polyp removed.  Possible residual polypoid tissue at the ascending colon segment previously tattooed, biopsied (tubular adenoma) and ablated with APC.  Recommend 2-year surveillance study.   COLONOSCOPY WITH PROPOFOL N/A 07/06/2020   Procedure: COLONOSCOPY WITH PROPOFOL;  Surgeon: Corbin Ade, MD;  [facility]: [facility];  Service: Endoscopy;  Laterality: N/A;  am appt,  diabetic. general anesthesia; intubation to control respirations. Add extra 15 min - needs to be 1st case per Melanie   MASTECTOMY, PARTIAL Left    benign   POLYPECTOMY  07/06/2017   Procedure: POLYPECTOMY;  Surgeon: Corbin Ade, MD;  [facility];  Service: Endoscopy;;  cecal polyp cs, ascending colon polyp hs   POLYPECTOMY  02/08/2018   Procedure: POLYPECTOMY;  Surgeon: Corbin Ade, MD;  [facility];  Service: Endoscopy;;   POLYPECTOMY  07/06/2020   Procedure: POLYPECTOMY INTESTINAL;  Surgeon: Corbin Ade, MD;  [facility];  Service: Endoscopy;;   TUBAL LIGATION      Family History: Family History  Problem Relation Age of Onset   Asthma Brother    Cancer Mother        stomach   Cancer Father        prostate   Cancer Brother        ?   Diabetes Brother    Allergic rhinitis Neg Hx    Angioedema Neg Hx    Atopy Neg Hx    Eczema Neg Hx    Immunodeficiency Neg Hx    Urticaria Neg Hx    Colon cancer Neg Hx     Social History:   reports that she has never smoked. She has never used smokeless tobacco. She reports that she does not drink alcohol and does not use drugs.  Medications: Medications Prior to Admission  Medication Sig Dispense Refill   acetaminophen (TYLENOL)  650 MG CR tablet Take 650 mg by mouth 2 (two) times daily.     amLODipine (NORVASC) 10 MG tablet Take 10 mg by mouth in the morning.     aspirin EC 81 MG tablet Take 81 mg by mouth in the morning. Swallow whole.     atorvastatin (LIPITOR) 20 MG tablet Take 1 tablet by mouth daily.     chlorthalidone (HYGROTON) 25 MG tablet Take 25 mg by mouth every morning.     diclofenac Sodium (VOLTAREN) 1 % GEL Apply 1 application topically 2 (two) times daily as needed.     DULoxetine (CYMBALTA) 60 MG capsule Take 60 mg by mouth at bedtime.     FARXIGA 10 MG TABS tablet Take 10 mg by mouth daily.     gabapentin (NEURONTIN) 300 MG capsule TAKE 2 CAPSULES BY MOUTH THREE TIMES DAILY  180 capsule 1   LANTUS SOLOSTAR 100 UNIT/ML Solostar Pen Inject 50 Units into the skin daily at 10 pm.     losartan (COZAAR) 100 MG tablet TAKE 1 TABLET BY MOUTH ONCE DAILY 90 tablet 0   metFORMIN (GLUCOPHAGE) 1000 MG tablet TAKE 1 TABLET BY MOUTH TWICE DAILY 60 tablet 0   metoprolol tartrate (LOPRESSOR) 25 MG tablet TAKE ONE TABLET BY MOUTH TWICE DAILY 180 tablet 2   OZEMPIC, 0.25 OR 0.5 MG/DOSE, 2 MG/1.5ML SOPN Inject 0.5 mg into the skin every Sunday.     potassium chloride SA (KLOR-CON) 20 MEQ tablet Take 1 tablet (20 mEq total) by mouth every other day. In the morning (Patient taking differently: Take 10 mEq by mouth daily. PER THE PT 07/13/22)     albuterol (VENTOLIN HFA) 108 (90 Base) MCG/ACT inhaler Inhale 2 puffs into the lungs every 6 (six) hours as needed for wheezing or shortness of breath. 8 g 2   B-D UF III MINI PEN NEEDLES 31G X 5 MM MISC USE WITH LANTUS PEN 100 each 0   guaiFENesin-dextromethorphan (ROBITUSSIN DM) 100-10 MG/5ML syrup Take 10 mLs by mouth every 8 (eight) hours. 118 mL 0   HYDROcodone-acetaminophen (NORCO) 5-325 MG tablet 1-2 tabs po q6 hours prn pain 20 tablet 0   naproxen (NAPROSYN) 500 MG tablet Take 1 tablet (500 mg total) by mouth 2 (two) times daily. 30 tablet 0   nitroGLYCERIN (NITROSTAT) 0.4 MG SL tablet Place 1 tablet (0.4 mg total) under the tongue every 5 (five) minutes as needed. 25 tablet 3    Results for orders placed or performed during the hospital encounter of 07/28/22 (from the past 48 hour(s))  Basic metabolic panel     Status: Abnormal   Collection Time: 07/28/22  7:00 AM  Result Value Ref Range   Sodium 138 135 - 145 mmol/L   Potassium 4.7 3.5 - 5.1 mmol/L   Chloride 111 98 - 111 mmol/L   CO2 19 (L) 22 - 32 mmol/L   Glucose, Bld 109 (H) 70 - 99 mg/dL    Comment: Glucose reference range applies only to samples taken after fasting for at least 8 hours.   BUN 29 (H) 8 - 23 mg/dL   Creatinine, Ser 1.61 (H) 0.44 - 1.00 mg/dL   Calcium 9.0 8.9 -  09.6 mg/dL   GFR, Estimated 43 (L) >60 mL/min    Comment: (NOTE) Calculated using the CKD-EPI Creatinine Equation (2021)    Anion gap 8 5 - 15    Comment: Performed at [facility], [address] [place], [place] [address]  Glucose, capillary  Status: Abnormal   Collection Time: 07/28/22  7:26 AM  Result Value Ref Range   Glucose-Capillary 111 (H) 70 - 99 mg/dL    Comment: Glucose reference range applies only to samples taken after fasting for at least 8 hours.    No results found.    Blood pressure 139/66, pulse 83, temperature 97.7 F (36.5 C), temperature source Oral, resp. rate 18, height 5\' 4"  (1.626 m), weight 113.1 kg, SpO2 99 %.  General appearance: alert, cooperative, and appears stated age Head: Normocephalic, without obvious abnormality, atraumatic Neck: supple, symmetrical, trachea midline Extremities: Intact capillary refill all digits.  +epl/fpl/io.  No wounds.  Pulses: 2+ and symmetric Skin: Skin color, texture, turgor normal. No rashes or lesions Neurologic: Grossly normal Incision/Wound: none  Assessment/Plan Right carpal tunnel syndrome.  Non operative and operative treatment options have been discussed with the patient and patient wishes to proceed with operative treatment. Risks, benefits, and alternatives of surgery have been discussed and the patient agrees with the plan of care.   Betha Loa 07/28/2022, 8:00 AM

## 2022-07-28 NOTE — Anesthesia Preprocedure Evaluation (Addendum)
Anesthesia Evaluation  Patient identified by MRN, date of birth, ID band Patient awake    Reviewed: Allergy & Precautions, H&P , NPO status , Patient's Chart, lab work & pertinent test results  Airway Mallampati: II  TM Distance: >3 FB Neck ROM: Full    Dental no notable dental hx. (+) Teeth Intact   Pulmonary neg pulmonary ROS   Pulmonary exam normal breath sounds clear to auscultation       Cardiovascular hypertension, Pt. on medications + DOE   Rhythm:Regular Rate:Normal     Neuro/Psych negative neurological ROS  negative psych ROS   GI/Hepatic Neg liver ROS,GERD  Medicated,,  Endo/Other  diabetes, Insulin Dependent, Oral Hypoglycemic Agents  Morbid obesity  Renal/GU negative Renal ROS  negative genitourinary   Musculoskeletal  (+) Arthritis , Osteoarthritis,    Abdominal   Peds  Hematology  (+) Blood dyscrasia, anemia   Anesthesia Other Findings   Reproductive/Obstetrics negative OB ROS                             Anesthesia Physical Anesthesia Plan  ASA: 3  Anesthesia Plan: General   Post-op Pain Management: Tylenol PO (pre-op)*   Induction: Intravenous  PONV Risk Score and Plan: 4 or greater and Ondansetron, Dexamethasone, Propofol infusion and TIVA  Airway Management Planned: LMA  Additional Equipment:   Intra-op Plan:   Post-operative Plan: Extubation in OR  Informed Consent: I have reviewed the patients History and Physical, chart, labs and discussed the procedure including the risks, benefits and alternatives for the proposed anesthesia with the patient or authorized representative who has indicated his/her understanding and acceptance.     Dental advisory given  Plan Discussed with: CRNA  Anesthesia Plan Comments:        Anesthesia Quick Evaluation

## 2022-07-29 ENCOUNTER — Encounter (HOSPITAL_BASED_OUTPATIENT_CLINIC_OR_DEPARTMENT_OTHER): Payer: Self-pay | Admitting: Orthopedic Surgery

## 2022-08-04 DIAGNOSIS — G5603 Carpal tunnel syndrome, bilateral upper limbs: Secondary | ICD-10-CM | POA: Diagnosis not present

## 2022-08-11 DIAGNOSIS — G5603 Carpal tunnel syndrome, bilateral upper limbs: Secondary | ICD-10-CM | POA: Diagnosis not present

## 2022-08-11 DIAGNOSIS — G5623 Lesion of ulnar nerve, bilateral upper limbs: Secondary | ICD-10-CM | POA: Diagnosis not present

## 2022-08-11 DIAGNOSIS — I25118 Atherosclerotic heart disease of native coronary artery with other forms of angina pectoris: Secondary | ICD-10-CM | POA: Diagnosis not present

## 2022-08-11 DIAGNOSIS — E118 Type 2 diabetes mellitus with unspecified complications: Secondary | ICD-10-CM | POA: Diagnosis not present

## 2022-08-11 DIAGNOSIS — I10 Essential (primary) hypertension: Secondary | ICD-10-CM | POA: Diagnosis not present

## 2022-08-11 DIAGNOSIS — E785 Hyperlipidemia, unspecified: Secondary | ICD-10-CM | POA: Diagnosis not present

## 2022-09-07 DIAGNOSIS — E118 Type 2 diabetes mellitus with unspecified complications: Secondary | ICD-10-CM | POA: Diagnosis not present

## 2022-09-07 DIAGNOSIS — I10 Essential (primary) hypertension: Secondary | ICD-10-CM | POA: Diagnosis not present

## 2022-09-07 DIAGNOSIS — E785 Hyperlipidemia, unspecified: Secondary | ICD-10-CM | POA: Diagnosis not present

## 2022-09-07 DIAGNOSIS — I25118 Atherosclerotic heart disease of native coronary artery with other forms of angina pectoris: Secondary | ICD-10-CM | POA: Diagnosis not present

## 2022-09-07 DIAGNOSIS — G5603 Carpal tunnel syndrome, bilateral upper limbs: Secondary | ICD-10-CM | POA: Diagnosis not present

## 2022-09-14 ENCOUNTER — Encounter: Payer: Self-pay | Admitting: Physician Assistant

## 2022-09-14 ENCOUNTER — Ambulatory Visit (INDEPENDENT_AMBULATORY_CARE_PROVIDER_SITE_OTHER): Payer: 59 | Admitting: Physician Assistant

## 2022-09-14 DIAGNOSIS — M1712 Unilateral primary osteoarthritis, left knee: Secondary | ICD-10-CM

## 2022-09-14 MED ORDER — LIDOCAINE HCL 1 % IJ SOLN
2.0000 mL | INTRAMUSCULAR | Status: AC | PRN
Start: 2022-09-14 — End: 2022-09-14
  Administered 2022-09-14: 2 mL

## 2022-09-14 MED ORDER — BUPIVACAINE HCL 0.25 % IJ SOLN
2.0000 mL | INTRAMUSCULAR | Status: AC | PRN
Start: 2022-09-14 — End: 2022-09-14
  Administered 2022-09-14: 2 mL via INTRA_ARTICULAR

## 2022-09-14 MED ORDER — METHYLPREDNISOLONE ACETATE 40 MG/ML IJ SUSP
80.0000 mg | INTRAMUSCULAR | Status: AC | PRN
Start: 2022-09-14 — End: 2022-09-14
  Administered 2022-09-14: 80 mg via INTRA_ARTICULAR

## 2022-09-14 NOTE — Progress Notes (Signed)
Office Visit Note   Patient: Ann Roberson           Date of Birth: 30-Jan-1951           MRN: [id number] Visit Date: 09/14/2022              Requested by: Aliene Beams, MD [id number] [place],  [place] [address] PCP: Aliene Beams, MD  Chief Complaint  Patient presents with  . Right Knee - Pain  . Left Knee - Pain      HPI: Patient is a pleasant 72 year old woman who was followed by Dr. Cleophas Dunker for arthritis in her knees.  She has had good results with injections.  She comes in today requesting an injection into the left knee no new injury.  She does have a history of diabetes.  She has recently lost quite a bit of weight but still has a BMI above 40.  Rates her pain as moderate especially when going up and down stairs  Assessment & Plan: Visit Diagnoses: Osteoarthritis left knee Plan: Will go forward with left knee injection today.  She will follow-up in 2 weeks to have her right knee injected.  Follow-Up Instructions: Return in about 2 weeks (around 09/28/2022).   Ortho Exam  Patient is alert, oriented, no adenopathy, well-dressed, normal affect, normal respiratory effort. Examination of her left knee no effusion no erythema compartments are soft and compressible negative Denna Haggard' sign she is neurovascular intact she walks with a antalgic gait and a cane she is tender around the posterior knee with range of motion and has positive crepitus.  Imaging: No results found.   Labs: Lab Results  Component Value Date   HGBA1C 7.6 (H) 07/12/2020   HGBA1C 7.1 (H) 10/24/2018   HGBA1C 7.0 (H) 10/23/2018   CRP 6.4 (H) 10/24/2018   CRP 7.5 (H) 10/23/2018   LABURIC 5.5 12/28/2008   REPTSTATUS 10/28/2018 FINAL 10/23/2018   GRAMSTAIN  12/23/2011    MODERATE WBC PRESENT, PREDOMINANTLY PMN NO SQUAMOUS EPITHELIAL CELLS SEEN FEW GRAM POSITIVE RODS FEW GRAM POSITIVE COCCI IN PAIRS   CULT  10/23/2018    NO GROWTH 5 DAYS Performed at [facility], [address]  [place], [place] [address]    [facility] ESCHERICHIA COLI 01/29/2011     Lab Results  Component Value Date   ALBUMIN 3.9 12/21/2020   ALBUMIN 2.9 (L) 10/25/2018   ALBUMIN 3.1 (L) 10/24/2018    Lab Results  Component Value Date   MG 1.1 (L) 10/24/2018   No results found for: "VD25OH"  No results found for: "PREALBUMIN"    Latest Ref Rng & Units 12/21/2020   12:10 PM 07/12/2020   10:56 AM 07/02/2020   10:51 AM  CBC EXTENDED  WBC 4.0 - 10.5 K/uL 5.2  7.1  5.8   RBC 3.87 - 5.11 MIL/uL 4.15  3.79  4.01   Hemoglobin 12.0 - 15.0 g/dL 82.9  56.2  13.0   HCT 36.0 - 46.0 % 38.2  35.3  37.3   Platelets 150 - 400 K/uL 271  233  281   NEUT# 1.7 - 7.7 K/uL  4.4  3.6   Lymph# 0.7 - 4.0 K/uL  1.8  1.4      There is no height or weight on file to calculate BMI.  Orders:  No orders of the defined types were placed in this encounter.  No orders of the defined types were placed in this encounter.    Procedures: Large Joint Inj:  L knee on 09/14/2022 9:45 AM Indications: pain and diagnostic evaluation Details: 25 G 1.5 in needle, anteromedial approach  Arthrogram: No  Medications: 80 mg methylPREDNISolone acetate 40 MG/ML; 2 mL lidocaine 1 %; 2 mL bupivacaine 0.25 % Outcome: tolerated well, no immediate complications Procedure, treatment alternatives, risks and benefits explained, specific risks discussed. Consent was given by the patient.    Clinical Data: No additional findings.  ROS:  All other systems negative, except as noted in the HPI. Review of Systems  Objective: Vital Signs: There were no vitals taken for this visit.  Specialty Comments:  No specialty comments available.  PMFS History: Patient Active Problem List   Diagnosis Date Noted  . Low back pain 02/01/2022  . Greater trochanteric bursitis of left hip 03/24/2021  . Wheezing   . Acute respiratory failure with hypoxia (HCC) 07/12/2020  . Multiple thyroid nodules 07/12/2020  . H/O adenomatous polyp of colon  05/18/2020  . Bilateral primary osteoarthritis of knee 04/08/2020  . Nausea vomiting and diarrhea 10/24/2018  . COVID-19 virus infection 10/23/2018  . DOE (dyspnea on exertion) 10/23/2018  . Tubulovillous adenoma of colon 01/09/2018  . Excessive ear wax 06/25/2017  . Unilateral primary osteoarthritis, left knee 06/17/2017  . Anemia in other chronic diseases classified elsewhere 06/17/2017  . Type 2 diabetes mellitus with other specified complication (HCC) 06/17/2017  . Upper airway cough syndrome 05/02/2017  . Obesity, Class III, BMI 40-49.9 (morbid obesity) (HCC) 05/02/2017  . Dyspnea on exertion 05/01/2017  . Hypertension associated with diabetes (HCC) 04/06/2017  . Hyperlipidemia associated with type 2 diabetes mellitus (HCC) 04/06/2017  . Urticaria 11/17/2015  . Angioedema 11/17/2015  . Pain in joint, lower leg 12/10/2013   Past Medical History:  Diagnosis Date  . Anemia   . Arthritis   . Diabetes mellitus   . GERD (gastroesophageal reflux disease)   . Heart murmur   . Hyperlipidemia   . Hypertension   . Neuropathy   . Sarcoidosis     Family History  Problem Relation Age of Onset  . Asthma Brother   . Cancer Mother        stomach  . Cancer Father        prostate  . Cancer Brother        ?  . Diabetes Brother   . Allergic rhinitis Neg Hx   . Angioedema Neg Hx   . Atopy Neg Hx   . Eczema Neg Hx   . Immunodeficiency Neg Hx   . Urticaria Neg Hx   . Colon cancer Neg Hx     Past Surgical History:  Procedure Laterality Date  . BIOPSY  02/08/2018   Procedure: BIOPSY;  Surgeon: Corbin Ade, MD;  [facility];  Service: Endoscopy;;  . CARPAL TUNNEL RELEASE Left 02/03/2022   Procedure: LEFT CARPAL TUNNEL RELEASE;  Surgeon: Betha Loa, MD;  Location: [facility];  Service: Orthopedics;  Laterality: Left;  Bier block  . CARPAL TUNNEL RELEASE Right 07/28/2022   Procedure: RIGHT CARPAL TUNNEL RELEASE;  Surgeon: Betha Loa, MD;  Location:  [facility];  Service: Orthopedics;  Laterality: Right;  30 MIN  . CESAREAN SECTION    . CHOLECYSTECTOMY    . COLONOSCOPY WITH PROPOFOL N/A 07/06/2017   Dr. Jena Gauss: Diverticulosis.five 8 to 16 mm polyps in the ascending colon and in the cecum, removed with hot snare.  Largest polyp could not be removed completely, status post inking.  Pathology revealed cecal tubular adenoma,  a sending colon polyps with multiple fragments of tubular adenomas, tubulovillous adenoma, no high-grade dysplasia or malignancy.  Tubular adenomas removed from the rectum as   . COLONOSCOPY WITH PROPOFOL N/A 02/08/2018   12 mm tubular adenoma removed from the ileocecal valve, hyperplastic sigmoid colon polyp removed.  Possible residual polypoid tissue at the ascending colon segment previously tattooed, biopsied (tubular adenoma) and ablated with APC.  Recommend 2-year surveillance study.  . COLONOSCOPY WITH PROPOFOL N/A 07/06/2020   Procedure: COLONOSCOPY WITH PROPOFOL;  Surgeon: Corbin Ade, MD;  [facility];  Service: Endoscopy;  Laterality: N/A;  am appt, diabetic. general anesthesia; intubation to control respirations. Add extra 15 min - needs to be 1st case per [facility]  . MASTECTOMY, PARTIAL Left    benign  . POLYPECTOMY  07/06/2017   Procedure: POLYPECTOMY;  Surgeon: Corbin Ade, MD;  [facility];  Service: Endoscopy;;  cecal polyp cs, ascending colon polyp hs  . POLYPECTOMY  02/08/2018   Procedure: POLYPECTOMY;  Surgeon: Corbin Ade, MD;  [facility];  Service: Endoscopy;;  . POLYPECTOMY  07/06/2020   Procedure: POLYPECTOMY INTESTINAL;  Surgeon: Corbin Ade, MD;  [facility];  Service: Endoscopy;;  . TUBAL LIGATION     Social History   Occupational History  . Not on file  Tobacco Use  . Smoking status: Never  . Smokeless tobacco: Never  Vaping Use  . Vaping Use: Never used  Substance and Sexual Activity  . Alcohol use: No  . Drug use:  No  . Sexual activity: Not Currently    Birth control/protection: Post-menopausal

## 2022-10-04 ENCOUNTER — Ambulatory Visit (INDEPENDENT_AMBULATORY_CARE_PROVIDER_SITE_OTHER): Payer: 59 | Admitting: Physician Assistant

## 2022-10-04 ENCOUNTER — Encounter: Payer: Self-pay | Admitting: Physician Assistant

## 2022-10-04 DIAGNOSIS — M1711 Unilateral primary osteoarthritis, right knee: Secondary | ICD-10-CM | POA: Diagnosis not present

## 2022-10-04 MED ORDER — BUPIVACAINE HCL 0.25 % IJ SOLN
2.00 mL | INTRAMUSCULAR | Status: AC | PRN
Start: 2022-10-04 — End: 2022-10-04
  Administered 2022-10-04: 2 mL via INTRA_ARTICULAR

## 2022-10-04 MED ORDER — LIDOCAINE HCL 1 % IJ SOLN
2.00 mL | INTRAMUSCULAR | Status: AC | PRN
Start: 2022-10-04 — End: 2022-10-04
  Administered 2022-10-04: 2 mL

## 2022-10-04 MED ORDER — METHYLPREDNISOLONE ACETATE 40 MG/ML IJ SUSP
80.00 mg | INTRAMUSCULAR | Status: AC | PRN
Start: 2022-10-04 — End: 2022-10-04
  Administered 2022-10-04: 80 mg via INTRA_ARTICULAR

## 2022-10-04 NOTE — Progress Notes (Signed)
Office Visit Note   Patient: AMIIRA LIER           Date of Birth: Jul 22, 1950           MRN: [id number] Visit Date: 10/04/2022              Requested by: Aliene Beams, MD [id number] [place],  [place] [address] PCP: Aliene Beams, MD  Chief Complaint  Patient presents with  . Right Knee - Pain      HPI: Patient is a pleasant fit 72 year old woman with a history of arthritis of both of her knees.  She also has a history of diabetes.  She came in a couple weeks ago to get an injection into her more symptomatic left knee.  She returns today for a steroid injection into her right knee.  She does say that the injection into her left knee was quite helpful  Assessment & Plan: Visit Diagnoses: Osteoarthritis bilateral knees  Plan: Will go forward with an injection into her right knee today may follow-up as needed.  Again discussed this may alter her blood sugars  Follow-Up Instructions: No follow-ups on file.   Ortho Exam  Patient is alert, oriented, no adenopathy, well-dressed, normal affect, normal respiratory effort. Right knee no effusion no erythema compartments are soft and nontender negative Denna Haggard' sign she is neurovascular intact  Imaging: No results found. No images are attached to the encounter.  Labs: Lab Results  Component Value Date   HGBA1C 7.6 (H) 07/12/2020   HGBA1C 7.1 (H) 10/24/2018   HGBA1C 7.0 (H) 10/23/2018   CRP 6.4 (H) 10/24/2018   CRP 7.5 (H) 10/23/2018   LABURIC 5.5 12/28/2008   REPTSTATUS 10/28/2018 FINAL 10/23/2018   GRAMSTAIN  12/23/2011    MODERATE WBC PRESENT, PREDOMINANTLY PMN NO SQUAMOUS EPITHELIAL CELLS SEEN FEW GRAM POSITIVE RODS FEW GRAM POSITIVE COCCI IN PAIRS   CULT  10/23/2018    NO GROWTH 5 DAYS Performed at [facility], [address] [place], [place] [address]    [facility] ESCHERICHIA COLI 01/29/2011     Lab Results  Component Value Date   ALBUMIN 3.9 12/21/2020   ALBUMIN 2.9 (L) 10/25/2018   ALBUMIN 3.1  (L) 10/24/2018    Lab Results  Component Value Date   MG 1.1 (L) 10/24/2018   No results found for: "VD25OH"  No results found for: "PREALBUMIN"    Latest Ref Rng & Units 12/21/2020   12:10 PM 07/12/2020   10:56 AM 07/02/2020   10:51 AM  CBC EXTENDED  WBC 4.0 - 10.5 K/uL 5.2  7.1  5.8   RBC 3.87 - 5.11 MIL/uL 4.15  3.79  4.01   Hemoglobin 12.0 - 15.0 g/dL 86.5  78.4  69.6   HCT 36.0 - 46.0 % 38.2  35.3  37.3   Platelets 150 - 400 K/uL 271  233  281   NEUT# 1.7 - 7.7 K/uL  4.4  3.6   Lymph# 0.7 - 4.0 K/uL  1.8  1.4      There is no height or weight on file to calculate BMI.  Orders:  No orders of the defined types were placed in this encounter.  No orders of the defined types were placed in this encounter.    Procedures: Large Joint Inj: R knee on 10/04/2022 11:09 AM Indications: pain and diagnostic evaluation Details: 25 G 1.5 in needle, anteromedial approach  Arthrogram: No  Medications: 80 mg methylPREDNISolone acetate 40 MG/ML; 2 mL lidocaine 1 %;  2 mL bupivacaine 0.25 % Outcome: tolerated well, no immediate complications Procedure, treatment alternatives, risks and benefits explained, specific risks discussed. Consent was given by the patient.    Clinical Data: No additional findings.  ROS:  All other systems negative, except as noted in the HPI. Review of Systems  Objective: Vital Signs: There were no vitals taken for this visit.  Specialty Comments:  No specialty comments available.  PMFS History: Patient Active Problem List   Diagnosis Date Noted  . Low back pain 02/01/2022  . Greater trochanteric bursitis of left hip 03/24/2021  . Wheezing   . Acute respiratory failure with hypoxia (HCC) 07/12/2020  . Multiple thyroid nodules 07/12/2020  . H/O adenomatous polyp of colon 05/18/2020  . Bilateral primary osteoarthritis of knee 04/08/2020  . Nausea vomiting and diarrhea 10/24/2018  . COVID-19 virus infection 10/23/2018  . DOE (dyspnea on exertion)  10/23/2018  . Tubulovillous adenoma of colon 01/09/2018  . Excessive ear wax 06/25/2017  . Unilateral primary osteoarthritis, left knee 06/17/2017  . Anemia in other chronic diseases classified elsewhere 06/17/2017  . Type 2 diabetes mellitus with other specified complication (HCC) 06/17/2017  . Upper airway cough syndrome 05/02/2017  . Obesity, Class III, BMI 40-49.9 (morbid obesity) (HCC) 05/02/2017  . Dyspnea on exertion 05/01/2017  . Hypertension associated with diabetes (HCC) 04/06/2017  . Hyperlipidemia associated with type 2 diabetes mellitus (HCC) 04/06/2017  . Urticaria 11/17/2015  . Angioedema 11/17/2015  . Pain in joint, lower leg 12/10/2013   Past Medical History:  Diagnosis Date  . Anemia   . Arthritis   . Diabetes mellitus   . GERD (gastroesophageal reflux disease)   . Heart murmur   . Hyperlipidemia   . Hypertension   . Neuropathy   . Sarcoidosis     Family History  Problem Relation Age of Onset  . Asthma Brother   . Cancer Mother        stomach  . Cancer Father        prostate  . Cancer Brother        ?  . Diabetes Brother   . Allergic rhinitis Neg Hx   . Angioedema Neg Hx   . Atopy Neg Hx   . Eczema Neg Hx   . Immunodeficiency Neg Hx   . Urticaria Neg Hx   . Colon cancer Neg Hx     Past Surgical History:  Procedure Laterality Date  . BIOPSY  02/08/2018   Procedure: BIOPSY;  Surgeon: Corbin Ade, MD;  [facility];  Service: Endoscopy;;  . CARPAL TUNNEL RELEASE Left 02/03/2022   Procedure: LEFT CARPAL TUNNEL RELEASE;  Surgeon: Betha Loa, MD;  Location: [facility];  Service: Orthopedics;  Laterality: Left;  Bier block  . CARPAL TUNNEL RELEASE Right 07/28/2022   Procedure: RIGHT CARPAL TUNNEL RELEASE;  Surgeon: Betha Loa, MD;  Location: [facility];  Service: Orthopedics;  Laterality: Right;  30 MIN  . CESAREAN SECTION    . CHOLECYSTECTOMY    . COLONOSCOPY WITH PROPOFOL N/A 07/06/2017   Dr. Jena Gauss:  Diverticulosis.five 8 to 16 mm polyps in the ascending colon and in the cecum, removed with hot snare.  Largest polyp could not be removed completely, status post inking.  Pathology revealed cecal tubular adenoma, a sending colon polyps with multiple fragments of tubular adenomas, tubulovillous adenoma, no high-grade dysplasia or malignancy.  Tubular adenomas removed from the rectum as   . COLONOSCOPY WITH PROPOFOL N/A 02/08/2018  12 mm tubular adenoma removed from the ileocecal valve, hyperplastic sigmoid colon polyp removed.  Possible residual polypoid tissue at the ascending colon segment previously tattooed, biopsied (tubular adenoma) and ablated with APC.  Recommend 2-year surveillance study.  . COLONOSCOPY WITH PROPOFOL N/A 07/06/2020   Procedure: COLONOSCOPY WITH PROPOFOL;  Surgeon: Corbin Ade, MD;  [facility];  Service: Endoscopy;  Laterality: N/A;  am appt, diabetic. general anesthesia; intubation to control respirations. Add extra 15 min - needs to be 1st case per [facility]  . MASTECTOMY, PARTIAL Left    benign  . POLYPECTOMY  07/06/2017   Procedure: POLYPECTOMY;  Surgeon: Corbin Ade, MD;  [facility];  Service: Endoscopy;;  cecal polyp cs, ascending colon polyp hs  . POLYPECTOMY  02/08/2018   Procedure: POLYPECTOMY;  Surgeon: Corbin Ade, MD;  [facility];  Service: Endoscopy;;  . POLYPECTOMY  07/06/2020   Procedure: POLYPECTOMY INTESTINAL;  Surgeon: Corbin Ade, MD;  [facility];  Service: Endoscopy;;  . TUBAL LIGATION     Social History   Occupational History  . Not on file  Tobacco Use  . Smoking status: Never  . Smokeless tobacco: Never  Vaping Use  . Vaping Use: Never used  Substance and Sexual Activity  . Alcohol use: No  . Drug use: No  . Sexual activity: Not Currently    Birth control/protection: Post-menopausal

## 2022-10-05 ENCOUNTER — Other Ambulatory Visit: Payer: Self-pay

## 2022-10-05 MED ORDER — METOPROLOL TARTRATE 25 MG PO TABS: 25.0000 mg | ORAL_TABLET | Freq: Two times a day (BID) | ORAL | 2 refills | Status: DC

## 2022-10-06 DIAGNOSIS — E785 Hyperlipidemia, unspecified: Secondary | ICD-10-CM | POA: Diagnosis not present

## 2022-10-06 DIAGNOSIS — E118 Type 2 diabetes mellitus with unspecified complications: Secondary | ICD-10-CM | POA: Diagnosis not present

## 2022-10-06 DIAGNOSIS — I25118 Atherosclerotic heart disease of native coronary artery with other forms of angina pectoris: Secondary | ICD-10-CM | POA: Diagnosis not present

## 2022-10-06 DIAGNOSIS — I10 Essential (primary) hypertension: Secondary | ICD-10-CM | POA: Diagnosis not present

## 2022-10-06 DIAGNOSIS — Z794 Long term (current) use of insulin: Secondary | ICD-10-CM | POA: Diagnosis not present

## 2022-10-20 DIAGNOSIS — E785 Hyperlipidemia, unspecified: Secondary | ICD-10-CM | POA: Diagnosis not present

## 2022-10-20 DIAGNOSIS — I25118 Atherosclerotic heart disease of native coronary artery with other forms of angina pectoris: Secondary | ICD-10-CM | POA: Diagnosis not present

## 2022-10-20 DIAGNOSIS — E118 Type 2 diabetes mellitus with unspecified complications: Secondary | ICD-10-CM | POA: Diagnosis not present

## 2022-10-20 DIAGNOSIS — I10 Essential (primary) hypertension: Secondary | ICD-10-CM | POA: Diagnosis not present

## 2022-10-27 ENCOUNTER — Ambulatory Visit (HOSPITAL_COMMUNITY)
Admission: RE | Admit: 2022-10-27 | Discharge: 2022-10-27 | Disposition: A | Discharge status: Home / Self Care | Payer: 59 | Source: Ambulatory Visit | Attending: Cardiology | Admitting: Cardiology

## 2022-10-27 DIAGNOSIS — I350 Nonrheumatic aortic (valve) stenosis: Secondary | ICD-10-CM | POA: Insufficient documentation

## 2022-10-27 LAB — ECHOCARDIOGRAM COMPLETE
AR max vel: 1.72 cm2
AV Area VTI: 1.63 cm2
AV Area mean vel: 1.72 cm2
AV Mean grad: 13 mmHg
AV Peak grad: 24.2 mmHg
Ao pk vel: 2.46 m/s
Area-P 1/2: 3.42 cm2
S' Lateral: 2.7 cm

## 2022-10-27 NOTE — Progress Notes (Signed)
*  PRELIMINARY RESULTS* Echocardiogram 2D Echocardiogram has been performed.  Stacey Drain 10/27/2022, 11:27 AM

## 2022-11-09 NOTE — Progress Notes (Signed)
Cardiology Office Note:  .   Date:  11/22/2022  ID:  Ann Roberson March 21, 1951, MRN [id number] PCP: Aliene Beams, MD  [facility] Providers Cardiologist:  Meriam Sprague, MD    History of Present Illness: .   Ann Roberson is a 72 y.o. female with a hx of presumed CAD abnormal nuclear study has never been cath because she was symptomatically stable, hypertension, mild-to-moderate AS, HLD, IDDM. Echo 10/2022 mild AS.  Patient comes in for f//u. She gets SOB if she does too much walking but this is unchanged. Denies chest pain, palpitations. Has had some ankle swelling. Once glipizide was stopped her swelling went down.She does get extra salt in her diet-fried chicken, some frozen dinner, canned food.   ROS:    Studies Reviewed: [name]    EKG Interpretation Date/Time:  Tuesday November 22 2022 10:38:06 EDT Ventricular Rate:  82 PR Interval:  140 QRS Duration:  108 QT Interval:  352 QTC Calculation: 411 R Axis:   55  Text Interpretation: Normal sinus rhythm Moderate voltage criteria for LVH, may be normal variant ( R in aVL , ) 12-Jul-2020 09:23, Confirmed by Jacolyn Reedy [phone]) on 11/22/2022 10:48:51 AM    Prior CV Studies:   Echo 10/2022 IMPRESSIONS     1. Left ventricular ejection fraction, by estimation, is 60 to 65%. The  left ventricle has normal function. Left ventricular endocardial border  not optimally defined to evaluate regional wall motion. There is moderate  left ventricular hypertrophy. Left  ventricular diastolic parameters are consistent with Grade I diastolic  dysfunction (impaired relaxation).   2. Right ventricular systolic function is normal. The right ventricular  size is normal.   3. The mitral valve is normal in structure. Trivial mitral valve  regurgitation. No evidence of mitral stenosis.   4. The aortic valve was not well visualized. Aortic valve regurgitation  is not visualized. Mild aortic valve stenosis. Aortic valve mean  gradient  measures 13.0 mmHg. Aortic valve peak gradient measures 24.2 mmHg. Aortic  valve area, by VTI measures 1.63  cm.   5. The inferior vena cava is normal in size with greater than 50%  respiratory variability, suggesting right atrial pressure of 3 mmHg.    Risk Assessment/Calculations:           Physical Exam:   VS:  BP (!) 142/60   Pulse 82   Ht 5\' 4"  (1.626 m)   Wt 249 lb 6.4 oz (113.1 kg)   SpO2 96%   BMI 42.81 kg/m    Wt Readings from Last 3 Encounters:  11/22/22 249 lb 6.4 oz (113.1 kg)  07/28/22 249 lb 5.4 oz (113.1 kg)  05/19/22 245 lb 12.8 oz (111.5 kg)    GEN: Obese, in no acute distress NECK: No JVD; No carotid bruits CARDIAC:  RRR, no murmurs, rubs, gallops RESPIRATORY:  Clear to auscultation without rales, wheezing or rhonchi  ABDOMEN: Soft, non-tender, non-distended EXTREMITIES:  No edema; No deformity   ASSESSMENT AND PLAN: .    Presumed CAD with abnormal nuclear stress in 2019: Nuc in 2019 with medium sized inferoseptal and inferolateral defect that was reversible. Managed medically due to no significant symptoms. Chronic DOE unchanged, continue with medical management. -Continue ASA 81mg  daily -Continue lipitor 20mg  daily   Mild-to-Moderate AS:  Aortic valve mean gradient  measures 13.0 mmHg -Continue serial monitoring with next 10/2023   HTN: BP well controlled at home.Didn't take her meds yet today.2 gm sodium diet -Continue  metop 25mg  BID -Continue amlodipine 10 mg daily -Continue chlorthalidone 25mg  daily -Continue losartan 100mg  daily   DMII: -Continue ozempic, farxiga, insulin and metformin -A1C is 6.3   HLD: -Continue lipitor 20mg  daily -LDL 48 09/2022   Morbid Obesity: BMI 42.  -Continue ozempic-has lost 60 lbs in the past year -Lifestyle modifications            Dispo: f/u in 12 months in [place] office  Signed, Jacolyn Reedy, PA-C

## 2022-11-14 DIAGNOSIS — N1832 Chronic kidney disease, stage 3b: Secondary | ICD-10-CM | POA: Diagnosis not present

## 2022-11-14 DIAGNOSIS — N390 Urinary tract infection, site not specified: Secondary | ICD-10-CM | POA: Diagnosis not present

## 2022-11-14 DIAGNOSIS — I129 Hypertensive chronic kidney disease with stage 1 through stage 4 chronic kidney disease, or unspecified chronic kidney disease: Secondary | ICD-10-CM | POA: Diagnosis not present

## 2022-11-14 DIAGNOSIS — M170 Bilateral primary osteoarthritis of knee: Secondary | ICD-10-CM | POA: Diagnosis not present

## 2022-11-14 DIAGNOSIS — R809 Proteinuria, unspecified: Secondary | ICD-10-CM | POA: Diagnosis not present

## 2022-11-14 DIAGNOSIS — E1122 Type 2 diabetes mellitus with diabetic chronic kidney disease: Secondary | ICD-10-CM | POA: Diagnosis not present

## 2022-11-22 ENCOUNTER — Ambulatory Visit: Payer: 59 | Attending: Cardiology | Admitting: Physician Assistant

## 2022-11-22 ENCOUNTER — Encounter: Payer: Self-pay | Admitting: Physician Assistant

## 2022-11-22 VITALS — BP 142/60 | HR 82 | Ht 64.0 in | Wt 249.4 lb

## 2022-11-22 DIAGNOSIS — I10 Essential (primary) hypertension: Secondary | ICD-10-CM | POA: Diagnosis not present

## 2022-11-22 DIAGNOSIS — E1169 Type 2 diabetes mellitus with other specified complication: Secondary | ICD-10-CM

## 2022-11-22 DIAGNOSIS — E785 Hyperlipidemia, unspecified: Secondary | ICD-10-CM

## 2022-11-22 DIAGNOSIS — Z7984 Long term (current) use of oral hypoglycemic drugs: Secondary | ICD-10-CM

## 2022-11-22 DIAGNOSIS — I350 Nonrheumatic aortic (valve) stenosis: Secondary | ICD-10-CM

## 2022-11-22 DIAGNOSIS — R9431 Abnormal electrocardiogram [ECG] [EKG]: Secondary | ICD-10-CM | POA: Diagnosis not present

## 2022-11-22 DIAGNOSIS — I2510 Atherosclerotic heart disease of native coronary artery without angina pectoris: Secondary | ICD-10-CM | POA: Diagnosis not present

## 2022-11-22 NOTE — Patient Instructions (Signed)
Medication Instructions:   Your physician recommends that you continue on your current medications as directed. Please refer to the Current Medication list given to you today.  *If you need a refill on your cardiac medications before your next appointment, please call your pharmacy*    Follow-Up: At [facility], you and your health needs are our priority.  As part of our continuing mission to provide you with exceptional heart care, we have created designated Provider Care Teams.  These Care Teams include your primary Cardiologist (physician) and Advanced Practice Providers (APPs -  Physician Assistants and Nurse Practitioners) who all work together to provide you with the care you need, when you need it.  We recommend signing up for the patient portal called "MyChart".  Sign up information is provided on this After Visit Summary.  MyChart is used to connect with patients for Virtual Visits (Telemedicine).  Patients are able to view lab/test results, encounter notes, upcoming appointments, etc.  Non-urgent messages can be sent to your provider as well.   To learn more about what you can do with MyChart, go to [email or web address].    Your next appointment:   1 year(s)  Provider:   Luane School, MD      Other Instructions  PLEASE PURCHASE SOME COMPRESSION STOCKINGS AT ANY LOCAL PHARMACY OR MEDICAL SUPPLY STORE AND START WEARING THOSE DURING THE DAY   Low-Sodium Eating Plan Salt (sodium) helps you keep a healthy balance of fluids in your body. Too much sodium can raise your blood pressure. It can also cause fluid and waste to be held in your body. Your health care provider or dietitian may recommend a low-sodium eating plan if you have high blood pressure (hypertension), kidney disease, liver disease, or heart failure. Eating less sodium can help lower your blood pressure and reduce swelling. It can also protect your heart, liver, and kidneys. What are tips for following  this plan? Reading food labels  Check food labels for the amount of sodium per serving. If you eat more than one serving, you must multiply the listed amount by the number of servings. Choose foods with less than 140 milligrams (mg) of sodium per serving. Avoid foods with 300 mg of sodium or more per serving. Always check how much sodium is in a product, even if the label says "unsalted" or "no salt added." Shopping  Buy products labeled as "low-sodium" or "no salt added." Buy fresh foods. Avoid canned foods and pre-made or frozen meals. Avoid canned, cured, or processed meats. Buy breads that have less than 80 mg of sodium per slice. Cooking  Eat more home-cooked food. Try to eat less restaurant, buffet, and fast food. Try not to add salt when you cook. Use salt-free seasonings or herbs instead of table salt or sea salt. Check with your provider or pharmacist before using salt substitutes. Cook with plant-based oils, such as canola, sunflower, or olive oil. Meal planning When eating at a restaurant, ask if your food can be made with less salt or no salt. Avoid dishes labeled as brined, pickled, cured, or smoked. Avoid dishes made with soy sauce, miso, or teriyaki sauce. Avoid foods that have monosodium glutamate (MSG) in them. MSG may be added to some restaurant food, sauces, soups, bouillon, and canned foods. Make meals that can be grilled, baked, poached, roasted, or steamed. These are often made with less sodium. General information Try to limit your sodium intake to 1,500-2,300 mg each day, or the amount told by your  provider. What foods should I eat? Fruits Fresh, frozen, or canned fruit. Fruit juice. Vegetables Fresh or frozen vegetables. "No salt added" canned vegetables. "No salt added" tomato sauce and paste. Low-sodium or reduced-sodium tomato and vegetable juice. Grains Low-sodium cereals, such as oats, puffed wheat and rice, and shredded wheat. Low-sodium crackers.  Unsalted rice. Unsalted pasta. Low-sodium bread. Whole grain breads and whole grain pasta. Meats and other proteins Fresh or frozen meat, poultry, seafood, and fish. These should have no added salt. Low-sodium canned tuna and salmon. Unsalted nuts. Dried peas, beans, and lentils without added salt. Unsalted canned beans. Eggs. Unsalted nut butters. Dairy Milk. Soy milk. Cheese that is naturally low in sodium, such as ricotta cheese, fresh mozzarella, or Swiss cheese. Low-sodium or reduced-sodium cheese. Cream cheese. Yogurt. Seasonings and condiments Fresh and dried herbs and spices. Salt-free seasonings. Low-sodium mustard and ketchup. Sodium-free salad dressing. Sodium-free light mayonnaise. Fresh or refrigerated horseradish. Lemon juice. Vinegar. Other foods Homemade, reduced-sodium, or low-sodium soups. Unsalted popcorn and pretzels. Low-salt or salt-free chips. The items listed above may not be all the foods and drinks you can have. Talk to a dietitian to learn more. What foods should I avoid? Vegetables Sauerkraut, pickled vegetables, and relishes. Olives. [place] fries. Onion rings. Regular canned vegetables, except low-sodium or reduced-sodium items. Regular canned tomato sauce and paste. Regular tomato and vegetable juice. Frozen vegetables in sauces. Grains Instant hot cereals. Bread stuffing, pancake, and biscuit mixes. Croutons. Seasoned rice or pasta mixes. Noodle soup cups. Boxed or frozen macaroni and cheese. Regular salted crackers. Self-rising flour. Meats and other proteins Meat or fish that is salted, canned, smoked, spiced, or pickled. Precooked or cured meat, such as sausages or meat loaves. Tomasa Blase. Ham. Pepperoni. Hot dogs. Corned beef. Chipped beef. Salt pork. Jerky. Pickled herring, anchovies, and sardines. Regular canned tuna. Salted nuts. Dairy Processed cheese and cheese spreads. Hard cheeses. Cheese curds. Blue cheese. Feta cheese. String cheese. Regular cottage cheese.  Buttermilk. Canned milk. Fats and oils Salted butter. Regular margarine. Ghee. Bacon fat. Seasonings and condiments Onion salt, garlic salt, seasoned salt, table salt, and sea salt. Canned and packaged gravies. Worcestershire sauce. Tartar sauce. Barbecue sauce. Teriyaki sauce. Soy sauce, including reduced-sodium soy sauce. Steak sauce. Fish sauce. Oyster sauce. Cocktail sauce. Horseradish that you find on the shelf. Regular ketchup and mustard. Meat flavorings and tenderizers. Bouillon cubes. Hot sauce. Pre-made or packaged marinades. Pre-made or packaged taco seasonings. Relishes. Regular salad dressings. Salsa. Other foods Salted popcorn and pretzels. Corn chips and puffs. Potato and tortilla chips. Canned or dried soups. Pizza. Frozen entrees and pot pies. The items listed above may not be all the foods and drinks you should avoid. Talk to a dietitian to learn more. This information is not intended to replace advice given to you by your health care provider. Make sure you discuss any questions you have with your health care provider. Document Revised: 03/31/2022 Document Reviewed: 03/31/2022 Elsevier Patient Education  2024 [employer].

## 2022-12-14 ENCOUNTER — Ambulatory Visit: Payer: 59 | Admitting: Cardiology

## 2023-02-08 DIAGNOSIS — N1831 Chronic kidney disease, stage 3a: Secondary | ICD-10-CM | POA: Diagnosis not present

## 2023-02-08 DIAGNOSIS — E1122 Type 2 diabetes mellitus with diabetic chronic kidney disease: Secondary | ICD-10-CM | POA: Diagnosis not present

## 2023-02-08 DIAGNOSIS — Z23 Encounter for immunization: Secondary | ICD-10-CM | POA: Diagnosis not present

## 2023-02-08 DIAGNOSIS — Z794 Long term (current) use of insulin: Secondary | ICD-10-CM | POA: Diagnosis not present

## 2023-02-15 DIAGNOSIS — M65342 Trigger finger, left ring finger: Secondary | ICD-10-CM | POA: Diagnosis not present

## 2023-05-31 ENCOUNTER — Other Ambulatory Visit: Payer: Self-pay

## 2023-05-31 MED ORDER — METOPROLOL TARTRATE 25 MG PO TABS: 25.0000 mg | ORAL_TABLET | Freq: Two times a day (BID) | ORAL | 1 refills | Status: DC

## 2023-08-14 ENCOUNTER — Other Ambulatory Visit (HOSPITAL_COMMUNITY): Payer: Self-pay | Admitting: Family Medicine

## 2023-08-14 DIAGNOSIS — Z1231 Encounter for screening mammogram for malignant neoplasm of breast: Secondary | ICD-10-CM

## 2023-08-23 ENCOUNTER — Encounter (HOSPITAL_COMMUNITY): Payer: Self-pay

## 2023-08-23 ENCOUNTER — Ambulatory Visit (HOSPITAL_COMMUNITY)
Admission: RE | Admit: 2023-08-23 | Discharge: 2023-08-23 | Disposition: A | Discharge status: Home / Self Care | Source: Ambulatory Visit | Attending: Family Medicine | Admitting: Family Medicine

## 2023-08-23 DIAGNOSIS — Z1231 Encounter for screening mammogram for malignant neoplasm of breast: Secondary | ICD-10-CM | POA: Diagnosis present

## 2023-11-12 IMAGING — MR MR LUMBAR SPINE W/O CM
4 of 5 series · 24 of 48 positions shown · non-contrast
Comparison: None.

CLINICAL DATA: Low back pain, left leg pain, spasms for 6 months

EXAM:
MRI LUMBAR SPINE WITHOUT CONTRAST
TECHNIQUE: Multiplanar, multisequence MR imaging of the lumbar spine was
performed. No intravenous contrast was administered.

[Series 3: T2 · sagittal · 4.0mm · 0.53mm/px · 7 of 15 slices shown (1 of 2)]
[im 1/15]
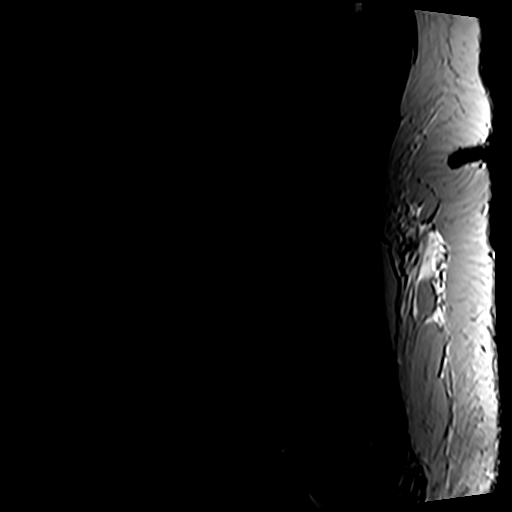
[im 3/15]
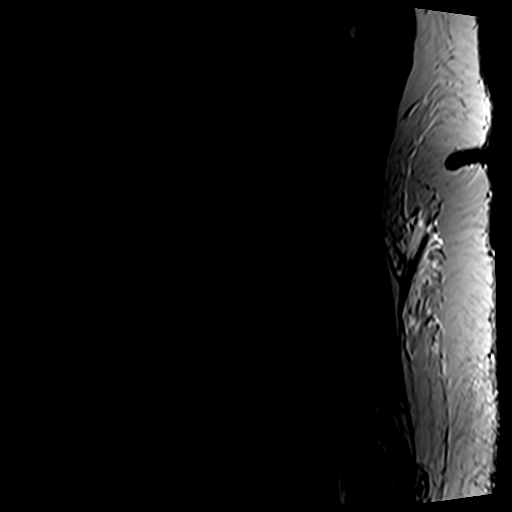
[im 5/15]
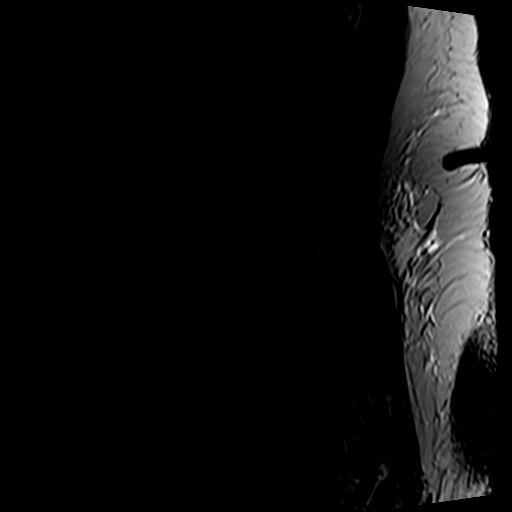
[im 8/15]
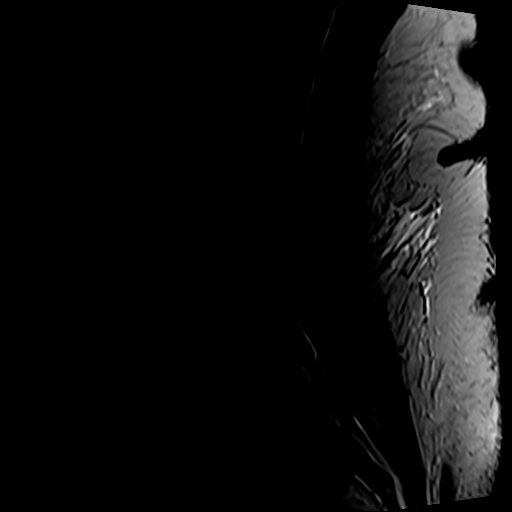
[im 10/15]
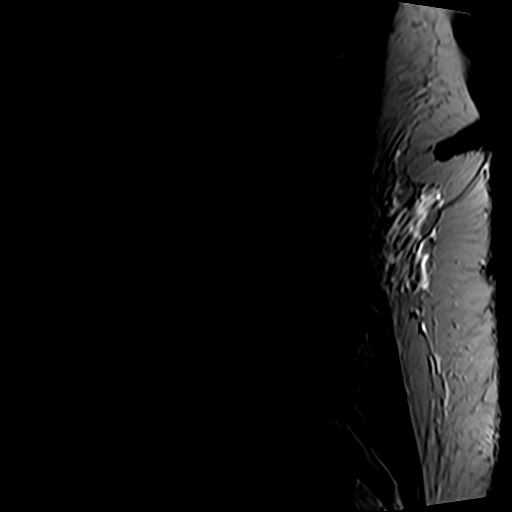
[im 12/15]
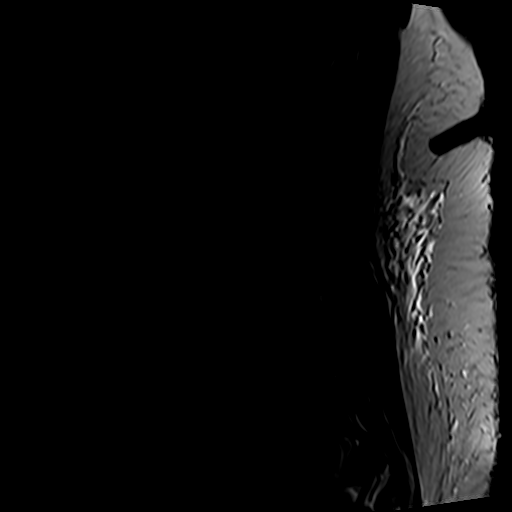
[im 15/15]
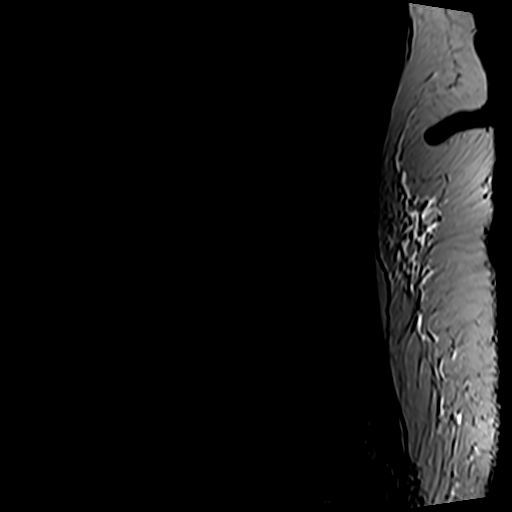

[Series 5: T1 · sagittal · 4.0mm · 0.53mm/px · 6 of 15 slices shown (1 of 2)]
[im 1/15]
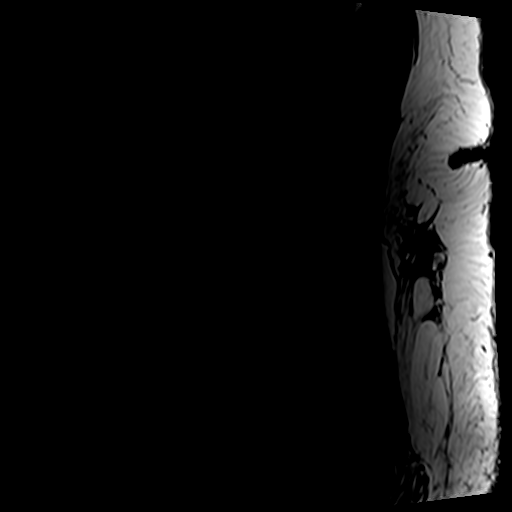
[im 3/15]
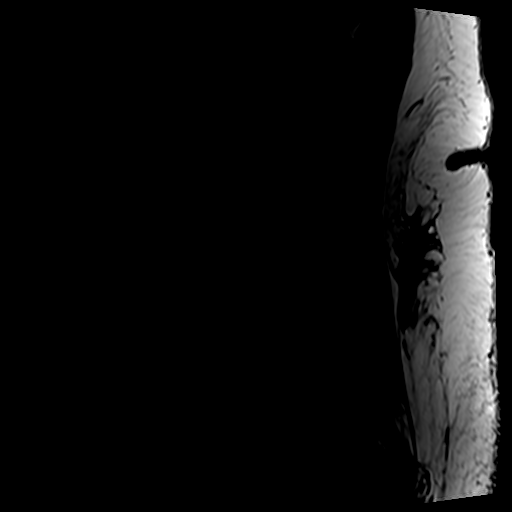
[im 6/15]
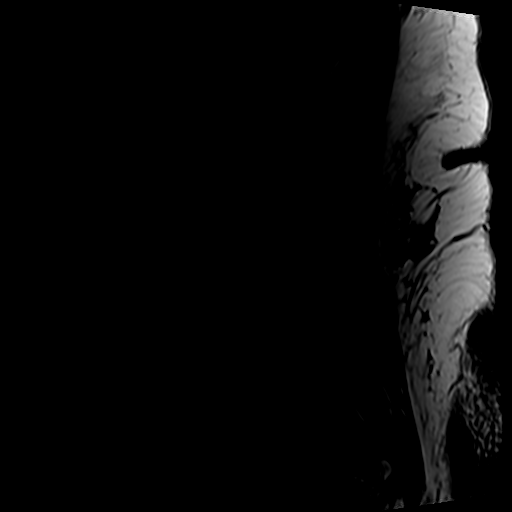
[im 9/15]
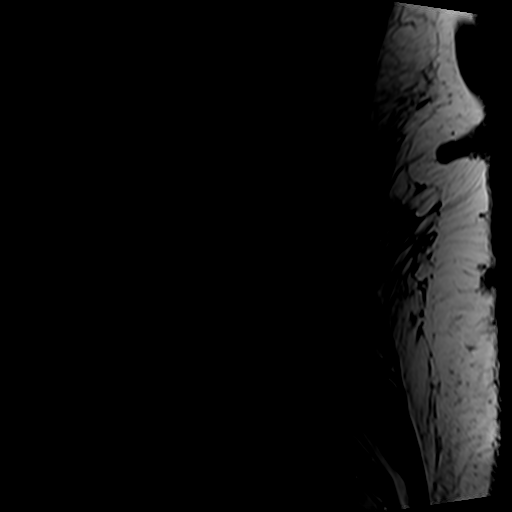
[im 12/15]
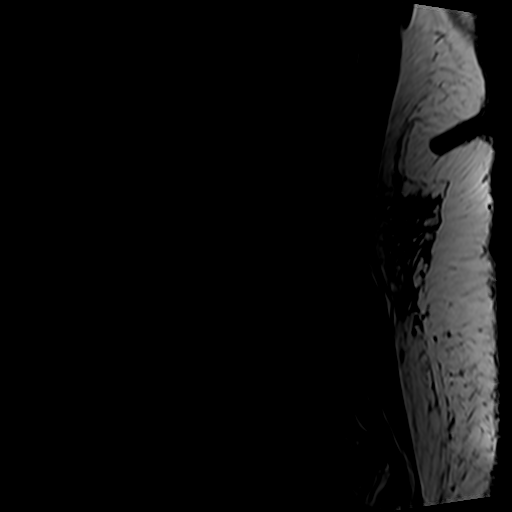
[im 15/15]
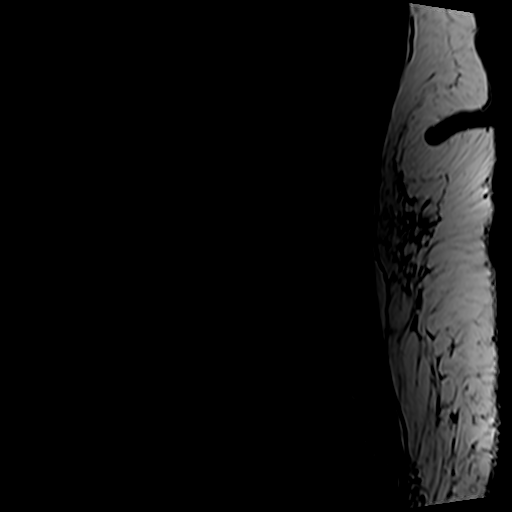

[Series 6: T2 · axial · 4.0mm · 0.70mm/px · z∈[-49,+144]mm · 8 of 34 slices shown (2 of 2)]
[im 1/34]
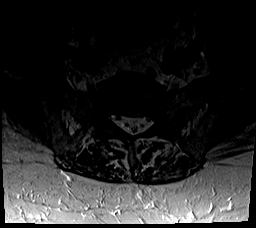
[im 6/34]
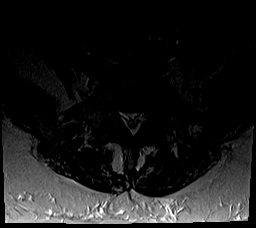
[im 11/34]
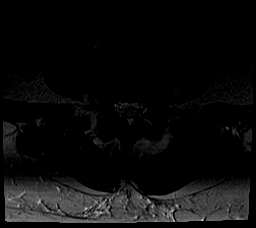
[im 16/34]
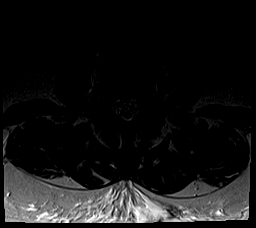
[im 18/34]
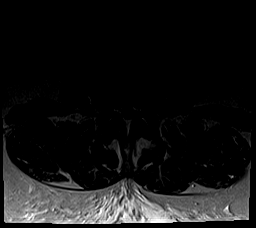
[im 23/34]
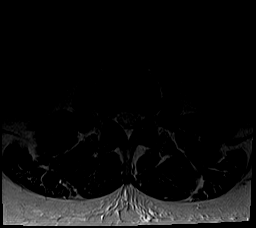
[im 28/34]
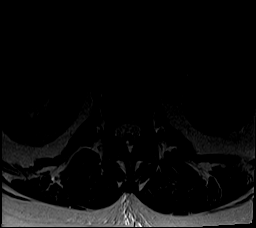
[im 34/34]
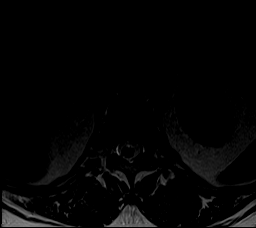

[Series 7: T1 · axial · 4.0mm · 0.35mm/px · z∈[-26,+113]mm · 3 of 34 slices shown (2 of 2)]
[im 6/34]
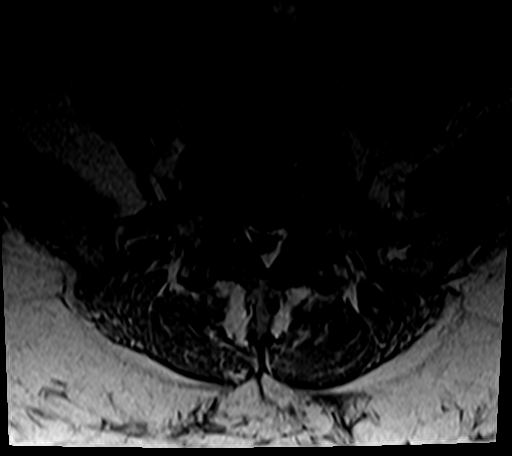
[im 18/34]
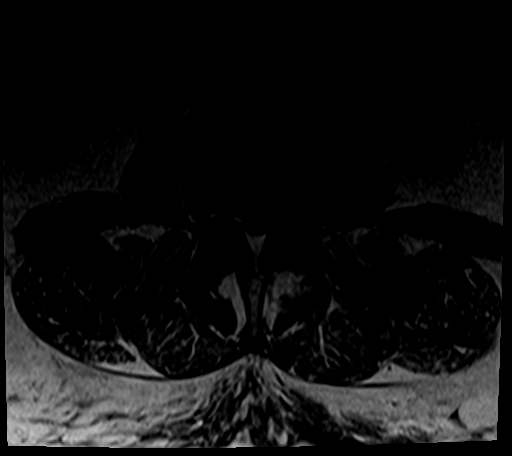
[im 28/34]
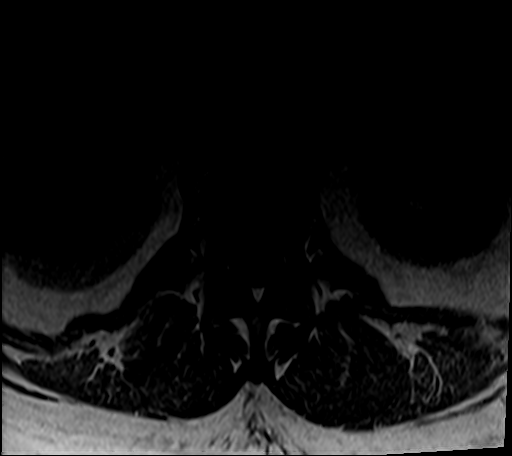

[24 of 48 positions shown; findings below may reference images not displayed]

FINDINGS: Segmentation:  Standard.

Alignment: Grade 1 anterolisthesis of L4 on L5 secondary to facet
disease.

Vertebrae: No acute fracture, evidence of discitis, or aggressive
bone lesion.

Conus medullaris and cauda equina: Conus extends to the L1 level.
Conus and cauda equina appear normal.

Paraspinal and other soft tissues: No acute paraspinal abnormality.

Other: Mild osteoarthritis of the right SI joint.

Disc levels:

Disc spaces: Disc desiccation at L2-3, L3-4, L4-5 with minimal disc
height loss.

T12-L1: No significant disc bulge. No neural foraminal stenosis. No
central canal stenosis.

L1-L2: No significant disc bulge. No neural foraminal stenosis. No
central canal stenosis. Mild bilateral facet arthropathy.

L2-L3: Broad-based disc bulge with a left paracentral/foraminal disc
protrusion. Moderate bilateral facet arthropathy with ligamentum
flavum infolding. Moderate spinal stenosis. Developmental narrowing
of the spinal canal. Mild right foraminal stenosis. Severe left
foraminal stenosis.

L3-L4: Broad-based disc bulge. Moderate bilateral facet arthropathy
with ligamentum flavum infolding. Moderate spinal stenosis. Moderate
bilateral foraminal stenosis.

L4-L5: Broad-based disc bulge. Severe bilateral facet arthropathy.
Ligamentum flavum infolding bilaterally. Moderate spinal stenosis.
Severe right foraminal stenosis. Moderate left foraminal stenosis.

L5-S1: No significant disc bulge. No neural foraminal stenosis. No
central canal stenosis. Mild bilateral facet arthropathy. Prominence
of the epidural fat deforming the thecal sac as can be seen with
epidural lipomatosis.
IMPRESSION: 1. At L2-3 there is a broad-based disc bulge with a left
paracentral/foraminal disc protrusion. Moderate bilateral facet
arthropathy with ligamentum flavum infolding. Moderate spinal
stenosis. Developmental narrowing of the spinal canal. Mild right
foraminal stenosis. Severe left foraminal stenosis.
2. At L3-4 there is a broad-based disc bulge. Moderate bilateral
facet arthropathy with ligamentum flavum infolding. Moderate spinal
stenosis. Moderate bilateral foraminal stenosis.
3. At L4-5 there is a broad-based disc bulge. Severe bilateral facet
arthropathy. Ligamentum flavum infolding bilaterally. Severe right
foraminal stenosis. Moderate left foraminal stenosis.
4. At L5-S1 there is mild bilateral facet arthropathy. Epidural
lipomatosis.
5.  No acute osseous injury of the lumbar spine.

## 2023-11-23 ENCOUNTER — Other Ambulatory Visit: Payer: Self-pay | Admitting: Physician Assistant

## 2023-12-25 ENCOUNTER — Other Ambulatory Visit: Payer: Self-pay | Admitting: Physician Assistant

## 2023-12-31 ENCOUNTER — Ambulatory Visit
Admission: EM | Admit: 2023-12-31 | Discharge: 2023-12-31 | Disposition: A | Discharge status: Home / Self Care | Attending: Family Medicine | Admitting: Family Medicine

## 2023-12-31 DIAGNOSIS — Z794 Long term (current) use of insulin: Secondary | ICD-10-CM

## 2023-12-31 DIAGNOSIS — R519 Headache, unspecified: Secondary | ICD-10-CM

## 2023-12-31 DIAGNOSIS — E119 Type 2 diabetes mellitus without complications: Secondary | ICD-10-CM

## 2023-12-31 LAB — GLUCOSE, POCT (MANUAL RESULT ENTRY): POC Glucose: 122 mg/dL — AB (ref 70–99)

## 2023-12-31 NOTE — ED Triage Notes (Addendum)
 Pt reports low BS x 1 week states no matter what she does her BS will not go past 100. Headache. Last night was the  lowest it has read. BS current 56

## 2023-12-31 NOTE — Discharge Instructions (Addendum)
 Your blood sugar here was 122.  I recommend doing manual fingerstick testing until you are able to call the company to check on your continuous glucose meter and see why the readings are currently inaccurate.  Follow-up as soon as possible additionally with your primary care provider or whoever manages your insulin /diabetes.  Make sure to eat a high-fiber and protein diet to help stabilize your blood pressures further and continue taking your medications as prescribed.

## 2023-12-31 NOTE — ED Notes (Signed)
 Pt CBG monitoring still displaying 62. Finger prick revealed glucose of 122. Pt reports last ate at approx 4am.

## 2023-12-31 NOTE — ED Provider Notes (Signed)
 [facility]    CSN: [id number] Arrival date & time: 12/31/23  1249      History   Chief Complaint No chief complaint on file.   HPI Ann Roberson is a 73 y.o. female.   Patient presenting today with about a week of low readings in the 50s and 60s with her continuous glucose monitor.  She states no matter what she does her blood sugar will not go past 100.  Had an alert for a low reading last night and states that her blood sugar was reading 54-56 so she got up at 4 AM and ate a snack.  Her blood sugar is still reading in the 50s and low 60s range per patient.  She has not done a fingerstick since onset of the symptoms.  Does have a mild headache today but states she has not eaten since 4 AM.  Has been drinking fluids.  Otherwise feeling in her usual baseline health.  History of insulin -dependent diabetes on Ozempic, metformin, Lantus  25 units at bedtime.    Past Medical History:  Diagnosis Date   Anemia    Arthritis    Diabetes mellitus    GERD (gastroesophageal reflux disease)    Heart murmur    Hyperlipidemia    Hypertension    Neuropathy    Sarcoidosis     Patient Active Problem List   Diagnosis Date Noted   Low back pain 02/01/2022   Greater trochanteric bursitis of left hip 03/24/2021   Wheezing    Acute respiratory failure with hypoxia (HCC) 07/12/2020   Multiple thyroid  nodules 07/12/2020   H/O adenomatous polyp of colon 05/18/2020   Bilateral primary osteoarthritis of knee 04/08/2020   Nausea vomiting and diarrhea 10/24/2018   COVID-19 virus infection 10/23/2018   DOE (dyspnea on exertion) 10/23/2018   Tubulovillous adenoma of colon 01/09/2018   Excessive ear wax 06/25/2017   Unilateral primary osteoarthritis, left knee 06/17/2017   Anemia in other chronic diseases classified elsewhere 06/17/2017   Type 2 diabetes mellitus with other specified complication (HCC) 06/17/2017   Upper airway cough syndrome 05/02/2017   Obesity, Class III, BMI  40-49.9 (morbid obesity) (HCC) 05/02/2017   Dyspnea on exertion 05/01/2017   Hypertension associated with diabetes (HCC) 04/06/2017   Hyperlipidemia associated with type 2 diabetes mellitus (HCC) 04/06/2017   Urticaria 11/17/2015   Angioedema 11/17/2015   Pain in joint, lower leg 12/10/2013    Past Surgical History:  Procedure Laterality Date   BIOPSY  02/08/2018   Procedure: BIOPSY;  Surgeon: Shaaron Lamar HERO, MD;  Location: [facility];  Service: Endoscopy;;   CARPAL TUNNEL RELEASE Left 02/03/2022   Procedure: LEFT CARPAL TUNNEL RELEASE;  Surgeon: Murrell Drivers, MD;  Location: [facility];  Service: Orthopedics;  Laterality: Left;  Bier block   CARPAL TUNNEL RELEASE Right 07/28/2022   Procedure: RIGHT CARPAL TUNNEL RELEASE;  Surgeon: Murrell Drivers, MD;  Location: [facility];  Service: Orthopedics;  Laterality: Right;  30 MIN   CESAREAN SECTION     CHOLECYSTECTOMY     COLONOSCOPY WITH PROPOFOL  N/A 07/06/2017   Dr. Shaaron: Diverticulosis.five 8 to 16 mm polyps in the ascending colon and in the cecum, removed with hot snare.  Largest polyp could not be removed completely, status post inking.  Pathology revealed cecal tubular adenoma, a sending colon polyps with multiple fragments of tubular adenomas, tubulovillous adenoma, no high-grade dysplasia or malignancy.  Tubular adenomas removed from the rectum as  COLONOSCOPY WITH PROPOFOL  N/A 02/08/2018   12 mm tubular adenoma removed from the ileocecal valve, hyperplastic sigmoid colon polyp removed.  Possible residual polypoid tissue at the ascending colon segment previously tattooed, biopsied (tubular adenoma) and ablated with APC.  Recommend 2-year surveillance study.   COLONOSCOPY WITH PROPOFOL  N/A 07/06/2020   Procedure: COLONOSCOPY WITH PROPOFOL ;  Surgeon: Shaaron Lamar HERO, MD;  Location: [facility];  Service: Endoscopy;  Laterality: N/A;  am appt, diabetic. general anesthesia; intubation to control respirations. Add  extra 15 min - needs to be 1st case per Melanie   MASTECTOMY, PARTIAL Left    benign   POLYPECTOMY  07/06/2017   Procedure: POLYPECTOMY;  Surgeon: Shaaron Lamar HERO, MD;  Location: [facility];  Service: Endoscopy;;  cecal polyp cs, ascending colon polyp hs   POLYPECTOMY  02/08/2018   Procedure: POLYPECTOMY;  Surgeon: Shaaron Lamar HERO, MD;  Location: [facility];  Service: Endoscopy;;   POLYPECTOMY  07/06/2020   Procedure: POLYPECTOMY INTESTINAL;  Surgeon: Shaaron Lamar HERO, MD;  Location: [facility];  Service: Endoscopy;;   TUBAL LIGATION      OB History     Gravida  1   Para  1   Term  1   Preterm      AB      Living  1      SAB      IAB      Ectopic      Multiple      Live Births               Home Medications    Prior to Admission medications   Medication Sig Start Date End Date Taking? Authorizing Provider  albuterol  (VENTOLIN  HFA) 108 (90 Base) MCG/ACT inhaler Inhale 2 puffs into the lungs every 6 (six) hours as needed for wheezing or shortness of breath. 07/13/20   Ricky Fines, MD  amLODipine  (NORVASC ) 10 MG tablet Take 10 mg by mouth in the morning.    [provider]  aspirin EC 81 MG tablet Take 81 mg by mouth in the morning. Swallow whole.    [provider]  atorvastatin  (LIPITOR) 20 MG tablet Take 1 tablet by mouth daily. 06/19/20   [provider]  B-D UF III MINI PEN NEEDLES 31G X 5 MM MISC USE WITH LANTUS  PEN 09/26/17   Rolinda Millman, MD  chlorthalidone (HYGROTON) 25 MG tablet Take 25 mg by mouth every morning. 06/16/22   [provider]  diclofenac  Sodium (VOLTAREN ) 1 % GEL Apply 1 application topically 2 (two) times daily as needed. 04/02/20   [provider]  DULoxetine  (CYMBALTA ) 60 MG capsule Take 60 mg by mouth at bedtime. 10/05/18   [provider]  FARXIGA 10 MG TABS tablet Take 10 mg by mouth daily. 06/30/20   [provider]  gabapentin  (NEURONTIN ) 300 MG capsule TAKE 2 CAPSULES  BY MOUTH THREE TIMES DAILY 11/24/17   Rolinda Millman, MD  guaiFENesin -dextromethorphan  (ROBITUSSIN DM) 100-10 MG/5ML syrup Take 10 mLs by mouth every 8 (eight) hours. 07/13/20   Ricky Fines, MD  HYDROcodone -acetaminophen  (NORCO/VICODIN) 5-325 MG tablet 1-2 tabs PO q6 hours prn pain 07/28/22   Kuzma, Kevin, MD  LANTUS  SOLOSTAR 100 UNIT/ML Solostar Pen Inject 25 Units into the skin daily at 10 pm. 12/31/19   [provider]  losartan  (COZAAR ) 100 MG tablet TAKE 1 TABLET BY MOUTH ONCE DAILY 12/11/17   Rolinda Millman, MD  metFORMIN (GLUCOPHAGE) 1000 MG tablet TAKE  1 TABLET BY MOUTH TWICE DAILY 12/11/17   Rolinda Millman, MD  metoprolol  tartrate (LOPRESSOR ) 25 MG tablet TAKE ONE (1) TABLET BY MOUTH TWICE DAILY *PATIENT MUST SCHEDULE OVER FOLLOW UP APPOINTMENT WITH CARDIOLOGY FOR REFILL, [id number]* 12/27/23   Parthenia Olivia HERO, PA-C  naproxen  (NAPROSYN ) 500 MG tablet Take 1 tablet (500 mg total) by mouth 2 (two) times daily. 03/05/21   Bero, Michael M, MD  nitroGLYCERIN  (NITROSTAT ) 0.4 MG SL tablet Place 1 tablet (0.4 mg total) under the tongue every 5 (five) minutes as needed. 08/29/17   Charls Pearla LABOR, MD  OZEMPIC, 0.25 OR 0.5 MG/DOSE, 2 MG/1.5ML SOPN Inject 0.5 mg into the skin every Sunday. 10/21/19   [provider]  potassium chloride  SA (KLOR-CON ) 20 MEQ tablet Take 1 tablet (20 mEq total) by mouth every other day. In the morning Patient taking differently: Take 10 mEq by mouth daily. PER THE PT 07/13/22 07/13/20   Ricky Fines, MD    Family History Family History  Problem Relation Age of Onset   Asthma Brother    Cancer Mother        stomach   Cancer Father        prostate   Cancer Brother        ?   Diabetes Brother    Allergic rhinitis Neg Hx    Angioedema Neg Hx    Atopy Neg Hx    Eczema Neg Hx    Immunodeficiency Neg Hx    Urticaria Neg Hx    Colon cancer Neg Hx     Social History Social History   Tobacco Use   Smoking status: Never   Smokeless tobacco:  Never  Vaping Use   Vaping status: Never Used  Substance Use Topics   Alcohol use: No   Drug use: No     Allergies   Erythromycin   Review of Systems Review of Systems Per HPI  Physical Exam Triage Vital Signs ED Triage Vitals  Encounter Vitals Group     BP 12/31/23 1256 136/82     Girls Systolic BP Percentile --      Girls Diastolic BP Percentile --      Boys Systolic BP Percentile --      Boys Diastolic BP Percentile --      Pulse Rate 12/31/23 1256 84     Resp 12/31/23 1256 20     Temp 12/31/23 1256 (!) 97.4 F (36.3 C)     Temp Source 12/31/23 1256 Oral     SpO2 12/31/23 1256 94 %     Weight --      Height --      Head Circumference --      Peak Flow --      Pain Score 12/31/23 1257 7     Pain Loc --      Pain Education --      Exclude from Growth Chart --    No data found.  Updated Vital Signs BP 136/82 (BP Location: Right Arm)   Pulse 84   Temp (!) 97.4 F (36.3 C) (Oral)   Resp 20   SpO2 94%   Visual Acuity Right Eye Distance:   Left Eye Distance:   Bilateral Distance:    Right Eye Near:   Left Eye Near:    Bilateral Near:     Physical Exam Vitals and nursing note reviewed.  Constitutional:      Appearance: Normal appearance. She is not ill-appearing.  HENT:  Head: Atraumatic.     Mouth/Throat:     Mouth: Mucous membranes are moist.  Eyes:     Extraocular Movements: Extraocular movements intact.     Conjunctiva/sclera: Conjunctivae normal.  Cardiovascular:     Rate and Rhythm: Normal rate and regular rhythm.     Heart sounds: Normal heart sounds.  Pulmonary:     Effort: Pulmonary effort is normal.     Breath sounds: Normal breath sounds.  Musculoskeletal:        General: Normal range of motion.     Cervical back: Normal range of motion and neck supple.  Skin:    General: Skin is warm and dry.  Neurological:     Mental Status: She is alert and oriented to person, place, and time.     Motor: No weakness.     Gait: Gait  normal.  Psychiatric:        Mood and Affect: Mood normal.        Thought Content: Thought content normal.        Judgment: Judgment normal.      UC Treatments / Results  Labs (all labs ordered are listed, but only abnormal results are displayed) Labs Reviewed  GLUCOSE, POCT (MANUAL RESULT ENTRY) - Abnormal; Notable for the following components:      Result Value   POC Glucose 122 (*)    All other components within normal limits    EKG   Radiology No results found.  Procedures Procedures (including critical care time)  Medications Ordered in UC Medications - No data to display  Initial Impression / Assessment and Plan / UC Course  I have reviewed the triage vital signs and the nursing notes.  Pertinent labs & imaging results that were available during my care of the patient were reviewed by me and considered in my medical decision making (see chart for details).     Fingerstick glucose currently 122, her monitor is currently reading 56.  Recommended she start manual fingerstick checks 3-4 times daily until she is able to recalibrate her machine and/or call the company for assistance as it appears that the machine readings are coming out inaccurate currently.  Reassurance given that she is currently not hypoglycemic, headache possibly due to not eating since 4 AM which she states is abnormal for her.  Discussed consistency with diet, high-protein and fiber to help stabilize blood sugars and following up as soon as possible with primary care provider.  Return for worsening symptoms.  Final Clinical Impressions(s) / UC Diagnoses   Final diagnoses:  Acute nonintractable headache, unspecified headache type  Insulin  dependent type 2 diabetes mellitus (HCC)     Discharge Instructions      Your blood sugar here was 122.  I recommend doing manual fingerstick testing until you are able to call the company to check on your continuous glucose meter and see why the readings are  currently inaccurate.  Follow-up as soon as possible additionally with your primary care provider or whoever manages your insulin /diabetes.  Make sure to eat a high-fiber and protein diet to help stabilize your blood pressures further and continue taking your medications as prescribed.    ED Prescriptions   None    PDMP not reviewed this encounter.   Stuart Vernell Norris, [place] 12/31/23 1357

## 2024-01-23 ENCOUNTER — Other Ambulatory Visit: Payer: Self-pay | Admitting: Physician Assistant

## 2024-02-21 ENCOUNTER — Other Ambulatory Visit: Payer: Self-pay | Admitting: Physician Assistant

## 2024-03-25 ENCOUNTER — Other Ambulatory Visit: Payer: Self-pay | Admitting: Physician Assistant

## 2024-05-07 ENCOUNTER — Ambulatory Visit: Admitting: Internal Medicine
